# Patient Record
Sex: Female | Born: 1970 | Race: White | Hispanic: No | Marital: Married | Smoking: Never smoker
Health system: Southern US, Community
[De-identification: ages and names within clinical notes are randomized; demographics above are authoritative.]

## PROBLEM LIST (undated history)

## (undated) DIAGNOSIS — R102 Pelvic and perineal pain: Secondary | ICD-10-CM

## (undated) DIAGNOSIS — I428 Other cardiomyopathies: Secondary | ICD-10-CM

## (undated) DIAGNOSIS — G43009 Migraine without aura, not intractable, without status migrainosus: Secondary | ICD-10-CM

## (undated) DIAGNOSIS — G5603 Carpal tunnel syndrome, bilateral upper limbs: Secondary | ICD-10-CM

## (undated) DIAGNOSIS — I10 Essential (primary) hypertension: Secondary | ICD-10-CM

## (undated) DIAGNOSIS — M1810 Unilateral primary osteoarthritis of first carpometacarpal joint, unspecified hand: Secondary | ICD-10-CM

## (undated) DIAGNOSIS — B001 Herpesviral vesicular dermatitis: Secondary | ICD-10-CM

## (undated) DIAGNOSIS — I509 Heart failure, unspecified: Secondary | ICD-10-CM

## (undated) DIAGNOSIS — I341 Nonrheumatic mitral (valve) prolapse: Secondary | ICD-10-CM

## (undated) DIAGNOSIS — T8859XA Other complications of anesthesia, initial encounter: Secondary | ICD-10-CM

## (undated) DIAGNOSIS — J309 Allergic rhinitis, unspecified: Secondary | ICD-10-CM

## (undated) DIAGNOSIS — E042 Nontoxic multinodular goiter: Secondary | ICD-10-CM

## (undated) DIAGNOSIS — F429 Obsessive-compulsive disorder, unspecified: Secondary | ICD-10-CM

## (undated) HISTORY — DX: Allergic rhinitis, unspecified: J30.9

## (undated) HISTORY — DX: Nonrheumatic mitral (valve) prolapse: I34.1

## (undated) HISTORY — DX: Migraine without aura, not intractable, without status migrainosus: G43.009

## (undated) HISTORY — DX: Obsessive-compulsive disorder, unspecified: F42.9

## (undated) HISTORY — DX: Unilateral primary osteoarthritis of first carpometacarpal joint, unspecified hand: M18.10

## (undated) HISTORY — PX: WRIST ARTHROSCOPY WITH CARPOMETACARPEL (CMC) ARTHROPLASTY: SHX5680

## (undated) HISTORY — PX: ABDOMINAL HYSTERECTOMY: SHX81

## (undated) HISTORY — DX: Herpesviral vesicular dermatitis: B00.1

## (undated) HISTORY — PX: WISDOM TOOTH EXTRACTION: SHX21

---

## 1998-11-04 ENCOUNTER — Other Ambulatory Visit: Admission: RE | Admit: 1998-11-04 | Discharge: 1998-11-04 | Payer: Self-pay | Admitting: Obstetrics and Gynecology

## 1999-01-05 ENCOUNTER — Other Ambulatory Visit: Admission: RE | Admit: 1999-01-05 | Discharge: 1999-01-05 | Payer: Self-pay | Admitting: Obstetrics and Gynecology

## 1999-01-05 ENCOUNTER — Encounter (INDEPENDENT_AMBULATORY_CARE_PROVIDER_SITE_OTHER): Payer: Self-pay | Admitting: Specialist

## 1999-01-19 ENCOUNTER — Encounter: Payer: Self-pay | Admitting: Obstetrics and Gynecology

## 1999-01-19 ENCOUNTER — Ambulatory Visit (HOSPITAL_COMMUNITY): Admission: RE | Admit: 1999-01-19 | Discharge: 1999-01-19 | Payer: Self-pay | Admitting: Obstetrics and Gynecology

## 1999-01-29 ENCOUNTER — Encounter (INDEPENDENT_AMBULATORY_CARE_PROVIDER_SITE_OTHER): Payer: Self-pay | Admitting: Specialist

## 1999-01-29 ENCOUNTER — Other Ambulatory Visit: Admission: RE | Admit: 1999-01-29 | Discharge: 1999-01-29 | Payer: Self-pay | Admitting: Obstetrics and Gynecology

## 1999-02-25 ENCOUNTER — Encounter: Payer: Self-pay | Admitting: Obstetrics and Gynecology

## 1999-02-25 ENCOUNTER — Ambulatory Visit (HOSPITAL_COMMUNITY): Admission: RE | Admit: 1999-02-25 | Discharge: 1999-02-25 | Payer: Self-pay | Admitting: Obstetrics and Gynecology

## 1999-05-01 ENCOUNTER — Ambulatory Visit (HOSPITAL_COMMUNITY): Admission: RE | Admit: 1999-05-01 | Discharge: 1999-05-01 | Payer: Self-pay | Admitting: *Deleted

## 1999-05-01 ENCOUNTER — Encounter: Payer: Self-pay | Admitting: *Deleted

## 1999-05-19 ENCOUNTER — Inpatient Hospital Stay (HOSPITAL_COMMUNITY): Admission: AD | Admit: 1999-05-19 | Discharge: 1999-05-19 | Payer: Self-pay | Admitting: *Deleted

## 1999-05-29 ENCOUNTER — Inpatient Hospital Stay (HOSPITAL_COMMUNITY): Admission: AD | Admit: 1999-05-29 | Discharge: 1999-06-01 | Payer: Self-pay | Admitting: Obstetrics and Gynecology

## 1999-12-02 ENCOUNTER — Other Ambulatory Visit: Admission: RE | Admit: 1999-12-02 | Discharge: 1999-12-02 | Payer: Self-pay | Admitting: Obstetrics and Gynecology

## 2000-12-02 ENCOUNTER — Encounter: Admission: RE | Admit: 2000-12-02 | Discharge: 2000-12-02 | Payer: Self-pay | Admitting: Family Medicine

## 2000-12-02 ENCOUNTER — Encounter: Payer: Self-pay | Admitting: Family Medicine

## 2001-05-05 ENCOUNTER — Other Ambulatory Visit: Admission: RE | Admit: 2001-05-05 | Discharge: 2001-05-05 | Payer: Self-pay | Admitting: Obstetrics and Gynecology

## 2001-05-10 ENCOUNTER — Ambulatory Visit (HOSPITAL_COMMUNITY): Admission: RE | Admit: 2001-05-10 | Discharge: 2001-05-10 | Payer: Self-pay | Admitting: Obstetrics and Gynecology

## 2001-05-10 ENCOUNTER — Encounter (INDEPENDENT_AMBULATORY_CARE_PROVIDER_SITE_OTHER): Payer: Self-pay

## 2002-04-18 ENCOUNTER — Other Ambulatory Visit: Admission: RE | Admit: 2002-04-18 | Discharge: 2002-04-18 | Payer: Self-pay | Admitting: Obstetrics and Gynecology

## 2002-10-17 ENCOUNTER — Inpatient Hospital Stay (HOSPITAL_COMMUNITY): Admission: AD | Admit: 2002-10-17 | Discharge: 2002-10-20 | Payer: Self-pay | Admitting: Obstetrics and Gynecology

## 2002-10-21 ENCOUNTER — Encounter: Admission: RE | Admit: 2002-10-21 | Discharge: 2002-11-20 | Payer: Self-pay | Admitting: Obstetrics and Gynecology

## 2004-07-07 ENCOUNTER — Other Ambulatory Visit: Admission: RE | Admit: 2004-07-07 | Discharge: 2004-07-07 | Payer: Self-pay | Admitting: Obstetrics and Gynecology

## 2010-05-15 ENCOUNTER — Encounter: Admission: RE | Admit: 2010-05-15 | Discharge: 2010-05-15 | Payer: Self-pay | Admitting: Obstetrics and Gynecology

## 2010-07-03 ENCOUNTER — Ambulatory Visit (HOSPITAL_COMMUNITY)
Admission: RE | Admit: 2010-07-03 | Discharge: 2010-07-03 | Payer: Self-pay | Source: Home / Self Care | Attending: Obstetrics and Gynecology | Admitting: Obstetrics and Gynecology

## 2010-07-06 LAB — CBC
HCT: 39.8 % (ref 36.0–46.0)
Hemoglobin: 13.2 g/dL (ref 12.0–15.0)
MCH: 27.8 pg (ref 26.0–34.0)
MCHC: 33.2 g/dL (ref 30.0–36.0)
MCV: 83.8 fL (ref 78.0–100.0)
Platelets: 408 10*3/uL — ABNORMAL HIGH (ref 150–400)
RBC: 4.75 MIL/uL (ref 3.87–5.11)
RDW: 13 % (ref 11.5–15.5)
WBC: 9.9 10*3/uL (ref 4.0–10.5)

## 2010-07-06 LAB — PREGNANCY, URINE: Preg Test, Ur: NEGATIVE

## 2010-07-09 NOTE — H&P (Addendum)
Leslie Esparza, Leslie Esparza NO.:  [id number]  MEDICAL RECORD NO.:  [id number]          PATIENT TYPE:  LOCATION:                                 FACILITY:  PHYSICIAN:  Hal Morales, M.D.     DATE OF BIRTH:  DATE OF ADMISSION: DATE OF DISCHARGE:                             HISTORY & PHYSICAL   DATE OF OPERATION:  July 03, 2010.  HISTORY OF THE PRESENT ILLNESS:  The patient is a 40 year old white married female para 2-0-1-2 who presents for evaluation of menstrual rrhagia with prolonged menses.  Up until approximately a year ago, the patient had menses every 28-32 days lasting for approximately 5 days. In the last year she has noticed that she has approximately 5 days of normal tapering bleeding but then had brown staining for another 7-10 days.  So she requires protection for approximately 12-14 days per month.  She has no other intermenstrual bleeding.  She has had no other GYN abnormality.  The last Pap smear was in November 2011 and was normal.  She has never had an abnormal Pap smear.  She uses condoms for contraception and is pleased with this method.  Her last menstrual period began on June 22, 2010.  PAST HISTORY:  Obstetrical, the patient had a primary low transverse cesarean section for breech in 2000.  She had a spontaneous abortion in 2002 and had a repeat cesarean section in 2004.  GYN history is essentially negative.  Medical history is positive for depression for which she is treated with Prozac.  The patient also has a history of mitral valve prolapse and has used antibiotic prophylaxis for dental cleaning.  FAMILY HISTORY:  Positive for heart disease, asthma, thyroid disease, cancer, diabetes and mental health problems.  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  She is of the [employer].  CURRENT MEDICATIONS:  Prozac 80 mg a day.  DRUG SENSITIVITIES:  PENICILLIN causes a rash.  She has not taken  it since she was a child.  She can take amoxicillin without difficulty.  REVIEW OF SYSTEMS:  As noted above.  PHYSICAL EXAMINATION:  GENERAL:  The patient is a well-developed white female in no acute distress. VITAL SIGNS:  Blood pressure is 110/76, weight is 149-1/2 pound, height is 5 feet 2 inches. LUNGS:  Clear. HEART:  Regular rate and rhythm. ABDOMEN:  Soft without masses or organomegaly. PELVIC:  EGBUS within normal limits.  The vagina is rugous.  There are small amount of brown discharge in the vagina.  The cervix is nontender without lesions.  The uterus is upper limits of normal size and nontender.  Adnexa, no masses.  Rectovaginal, no masses.  Sonohysterogram performed shows uterus that measures 10 x 5 x 4 cm.  The endometrial thickness on day 3 is 6 mm.  The right and left ovaries are normal.  On sonohysterogram, saline infusion shows a hyperechoic mass in the posterior lower uterine segment measuring 1.1 x 0.6 cm.  The endometrial width is 2.27 cm and the length is 6.73 cm.  IMPRESSION: 1. Metrorrhagia with prolonged menstrual spotting. 2. Sonohysterogram consistent  with endometrial polyp.  DISPOSITION:  A recommendation is given to the patient that she undergo hysteroscopic removal of the endometrial polyp and this will be performed at [facility].  The risks of anesthesia, bleeding, infection and damage to adjacent organs have all been reviewed and the patient wishes to proceed.  She has been given reading information from the [facility] pamphlet on hysteroscopy.     Hal Morales, M.D.     VPH/MEDQ  D:  06/24/2010  T:  06/25/2010  Job:  [id number]  Electronically Signed by Dierdre Forth M.D. on 07/09/2010 03:30:29 PM

## 2010-07-09 NOTE — Op Note (Addendum)
  Leslie Esparza, Leslie Esparza            ACCOUNT NO.:  [id number]  MEDICAL RECORD NO.:  [id number]          PATIENT TYPE:  AMB  LOCATION:  SDC                           [facility]ILITY:  [facility]  PHYSICIAN:  Hal Morales, M.D.DATE OF BIRTH:  November 29, 1970  DATE OF PROCEDURE:  07/03/2010 DATE OF DISCHARGE:  07/03/2010                              OPERATIVE REPORT   PREOPERATIVE DIAGNOSES:  Metrorrhagia and endometrial polyp.  POSTOPERATIVE DIAGNOSES:  Metrorrhagia and endometrial polyp.  OPERATIONS:  Hysteroscopy and polyp resection.  SURGEON:  Hal Morales, MD  ANESTHESIA:  General LMA.  ESTIMATED BLOOD LOSS:  Less than 10 mL.  COMPLICATIONS:  None.  FINDINGS:  The endometrial cavity for the most part was fairly atrophic, but did contain a 1-cm polyp emanating from the left anterior uterus near the lower uterine segment.  PROCEDURE:  The patient was taken to the operating room after appropriate identification and placed on the operating table.  After the attainment of adequate general anesthesia, she was placed in lithotomy position.  The perineum and vagina were prepped with multiple layers of Betadine and a red Robinson catheter used to empty the bladder.  The perineum was draped as a sterile field.  A Graves speculum was placed in the vagina and a paracervical block achieved with a total of 10 mL of 2% Xylocaine in the 5 and 7 o'clock positions.  A single-tooth tenaculum was placed on the cervix.  The uterus was sounded to 7-cm.  The cervix was dilated to accommodate the diagnostic hysteroscope and this was used to evaluate the endometrial cavity with the above-noted findings.  These findings were documented and the cervix was further dilated to accommodate the operative hysteroscope.  The aforementioned polyp was resected and removed from the operative field.  The endometrial cavity and endocervix were then curetted and sent as separate specimens.  All instruments were then  removed from the vagina.  A single suture was placed on the cervix to allow hemostasis at the site of the tenaculum placement.  Hemostasis was adequate and the patient was awakened from general anesthesia and taken to the recovery room in satisfactory condition having tolerated the procedure well with sponge and instrument counts correct.  SPECIMENS TO PATHOLOGY:  Endometrial polyp, endometrial curettings.  DISCHARGE INSTRUCTIONS:  Printed instructions for hysteroscopy from the [facility].  DISCHARGE MEDICATIONS: 1. Ibuprofen 600 mg p.o. q.6 h. for 3 days then p.r.n. pain. 2. Vicodin 5/500 one p.o. q.4 h. p.r.n. severe pain.  FOLLOWUP INSTRUCTIONS:  The patient is to followup in 2 weeks with Dr. Pennie Rushing.     Hal Morales, M.D.     VPH/MEDQ  D:  07/03/2010  T:  07/04/2010  Job:  [id number]  Electronically Signed by Dierdre Forth M.D. on 07/09/2010 03:30:36 PM

## 2010-10-30 NOTE — Discharge Summary (Signed)
NAME:  Leslie Esparza, Leslie Esparza                      ACCOUNT NO.:  [id number]   MEDICAL RECORD NO.:  [id number]                   PATIENT TYPE:  INP   LOCATION:  9134                                 [facility]ILITY:  [facility]   PHYSICIAN:  Naima A. Dillard, M.D.              DATE OF BIRTH:  07/27/1970   DATE OF ADMISSION:  10/17/2002  DATE OF DISCHARGE:  10/20/2002                                 DISCHARGE SUMMARY   ADMISSION DIAGNOSES:  1. Intrauterine pregnancy at 37 and two-sevenths weeks.  2. Spontaneous rupture of membranes.  3. Early labor.  4. Planned repeat cesarean section due to previous cesarean section.  5. PENICILLIN allergy.  6. Mitral valve prolapse without regurgitation.   DISCHARGE DIAGNOSES:  1. Intrauterine pregnancy at 37 and two-sevenths weeks.  2. Spontaneous rupture of membranes.  3. Early labor.  4. Planned repeat cesarean section due to previous cesarean section.  5. PENICILLIN allergy.  6. Mitral valve prolapse without regurgitation.   HOSPITAL PROCEDURES:  1. Spinal anesthesia.  2. Repeat low transverse cesarean section of a viable female infant weighing     6 pounds 15 ounces, Apgars 9 and 9, with no complications.   HOSPITAL COURSE:  The patient was admitted with leakage of clear fluid and  spontaneous rupture of membranes in early labor.  The patient had planned on  a repeat cesarean section the following week due to a previous cesarean  section for a breech presentation, and therefore a repeat cesarean section  was undertaken.  The infant was delivered and the patient was taken to  recovery with no complications.  On postoperative day #1 the patient was  doing well, voiding without difficulty, and tolerating clear liquids.  Vital  signs were stable and she was afebrile.  Hemoglobin was 10.4 and her  physical exam was within normal limits.  On postoperative day #2 the patient  reported several loose stools but did not have any fever, nausea, or  vomiting.  She  was placed on a BRAT diet and the episode resolved.  Orthostatic blood pressures were within normal limits and she began to feel  better.  White blood cell count was 10.5 and she continued to be observed.  On postoperative day #3 she was ready to go home.  She had experienced no  more diarrhea stools.  Vital signs were stable; she was afebrile.  Breasts  were soft and nontender.  Heart rate regular rate and rhythm.  Chest clear.  Abdomen soft and appropriately tender.  Incision clean, dry, and intact.  Extremity within normal limits.  Lochia small.  She was deemed to have  received the full benefit of her hospital stay and was discharged home.   DISCHARGE LABORATORY DATA:  White blood cell count 10.5, hemoglobin 11.5,  hematocrit 34.0, platelets 294.  RPR nonreactive.   DISCHARGE MEDICATIONS:  1. Motrin 600 mg p.o. q.6h. p.r.n.  2. Tylox one to two p.o.  q.4h. p.r.n.   DISCHARGE INSTRUCTIONS:  Per [facility] handout.   DISCHARGE FOLLOW-UP:  In six weeks or p.r.n.     Marie L. Williams, C.N.M.                 Naima A. Normand Sloop, M.D.    MLW/MEDQ  D:  10/20/2002  T:  10/20/2002  Job:  [id number]

## 2010-10-30 NOTE — Op Note (Signed)
NAME:  Leslie Esparza, Leslie Esparza                      ACCOUNT NO.:  [id number]   MEDICAL RECORD NO.:  [id number]                   PATIENT TYPE:  INP   LOCATION:  9134                                 [facility]ILITY[facility]   PHYSICIAN:  Naima A. Dillard, M.D.              DATE OF BIRTH:  1970/12/02   DATE OF PROCEDURE:  DATE OF DISCHARGE:                                 OPERATIVE REPORT   PREOPERATIVE DIAGNOSIS:  Rupture of membranes at 36 weeks, desires repeat  cesarean section.   POSTOPERATIVE DIAGNOSIS:  Rupture of membranes at 36 weeks, desires repeat  cesarean section.   PROCEDURE:  Repeat cesarean section.   ANESTHESIA:  Spinal.   SURGEON:  Naima A. Normand Sloop, M.D.   ASSISTANT:  Rica Koyanagi, C.N.M.   INTRAVENOUS FLUIDS:  2600 mL Crystalloid   URINE OUTPUT:  275 mL clear urine   ESTIMATED BLOOD LOSS:  600 mL   FINDINGS:  Female infant in vertex presentation with Apgars of 9 and 9  weighing 6 pounds 15 ounces.  Normal-appearing uterus, tubes, and ovaries  bilaterally.   COMPLICATIONS:  None.   PROCEDURE IN DETAIL:  The patient was taken to the operating room where she  was given spinal anesthesia, placed in dorsal supine position with a left  lateral tilt, prepped and draped in the normal sterile fashion.  Before she  was prepped and draped, she was checked and her cervix was found to be 1 to  2, 70, -3.  Once her spinal anesthesia was felt to be adequate, a  Pfannenstiel skin incision was made through her prior incision and carried  down to the fascia using Bovie cautery.  The fascia was then incised in the  midline and excised bilaterally using ______ with teeth and Mayo scissors.  Kocher's x2 were placed on the superior aspect of the fascia which was  dissected off the rectus muscle both sharply with the Bovie cautery and  bluntly.  The inferior aspect of her fascia was dissected off the rectus  muscle in a similar fascia.  The rectus muscle was separated in the  midline.  The peritoneum was identified ________ Metzenbaum scissors bilaterally.  Bladder blade was reinserted.  A low transverse uterine incision was then  made with a scalpel and extended bluntly.  The head was delivered through  the incision.  There was some difficulty from the head coming out of  incision so a vacuum was placed on the infant's head in the normal fashion.  There was one popoff, two pulls, head delivered through the incision, all  pulls were done in the green field at 500 mmHg, just one popoff and two  pulls.  There was one body cord then the body delivered without difficulty  with was clear fluid noted.  Cord gas was obtained.  Placenta was manually  extracted and uterus cleared of all clot and debris.  Uterine incision was  repaired with  0 Vicryl in a running lock fashion and second layer, 0 Vicryl  was used for imbrication.  The abdomen was irrigated with copious saline.  Both tubes and ovaries were found to be  normal.  The peritoneum was reapproximated using 0 chromic.  The fascia was  closed using 0 Vicryl in a running fashion.  Once the incision was irrigated  the skin was closed with staples.  Sponge, lap and needle counts were  correct x2.  The patient returned to recovery room in stable condition.                                               Naima A. Normand Sloop, M.D.    NAD/MEDQ  D:  10/17/2002  T:  10/17/2002  Job:  [id number]

## 2010-10-30 NOTE — H&P (Signed)
NAME:  Leslie Esparza, Leslie Esparza                      ACCOUNT NO.:  [id number]   MEDICAL RECORD NO.:  [id number]                   PATIENT TYPE:  MAT   LOCATION:  MATC                                 [facility]   PHYSICIAN:  Naima A. Dillard, M.D.              DATE OF BIRTH:  1971/03/21   DATE OF ADMISSION:  10/17/2002  DATE OF DISCHARGE:                                HISTORY & PHYSICAL   HISTORY OF PRESENT ILLNESS:  The patient is a 40 year old gravida 3 para 1-0-  1-1 at 44 and two-sevenths weeks who presents today with spontaneous rupture  of membranes at approximately 8 a.m. and irregular contractions since then.  She is scheduled for a repeat cesarean section on Oct 25, 2002 and she does  wish to proceed today with cesarean section.  The patient's pregnancy is  remarkable for:  1. Previous low transverse cesarean section in 2000 for a breech     presentation.  2. History of panic disease with obsessive-compulsive disorder, in good     control.  3. Mitral valve prolapse without regurgitation.  4. History of dysplastic nevi.  5. History of irregular cycles.  6. Spotting in first trimester.  7. PENICILLIN allergy.   PRENATAL LABORATORY DATA:  Blood type O positive, Rh antibody negative.  VDRL nonreactive.  Rubella titer positive.  Hepatitis B surface antigen  negative.  GC and chlamydia cultures negative.  Pap normal.  Glucose  challenge normal.  AFP normal.  Cystic fibrosis testing negative.  HIV  declined.  Hemoglobin upon entering the practice was 12.6; it was 10.8 to  11.5 at 27 weeks.  EDC of Nov 09, 2002 was established by last menstrual  period and was in agreement with ultrasound at approximately 18 weeks.  Group B strep culture was negative at 36 weeks.   HISTORY OF PRESENT ILLNESS:  The patient entered care at approximately 10  weeks.  She had no history of mitral valve regurgitation upon review of her  records.  She had an upper respiratory infection at  approximately 15 weeks  that was treated with Tussionex and a Z pack.  She had an ultrasound at 19  weeks that showed normal growth and development.  She declined a third  trimester RPR.  She made a plan for repeat C section.  The rest of her  pregnancy was essentially uncomplicated.  She did have an onset of  approximately two days of diarrhea at 37 weeks which did resolve  spontaneously.  She also had some issues with hemorrhoids.   OBSTETRICAL HISTORY:  1. In 2000 she had a primary low transverse cesarean section of a breech     presentation for a female infant that weighed 8 pounds at 39 and four-     sevenths weeks.  2. In December 2002 she had an intrauterine fetal demise at nine weeks with     a D&C.   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  She is a previous Triphasil user.   PREVIOUS SURGERIES:  Primary low transverse cesarean section in December  2000.   ALLERGIES:  PENICILLIN.   MEDICAL HISTORY:  She does have a history of mitral valve for which she  usually takes prophylaxis for dental work but no other procedures.  She was  diagnosed with obsessive-compulsive and panic disorder by Dr. Smith Mince.  She previously has been on Prozac but has not been on anything during her  pregnancy.  She had an excision of a dysplastic nevi in the past.   FAMILY HISTORY:  Paternal grandfather had an MI.  Father has hypertension.  Cousin had a positive PPD.  Her paternal grandfather had insulin-dependent  diabetes.  Father and paternal aunt have thyroid problems.  Maternal great-  grandmother and maternal grandfather had cancer.   GENETIC HISTORY:  Unremarkable.   SOCIAL HISTORY:  The patient is married to the father of the baby.  He is  involved and supportive.  His name is Lilya Smitherman.  The patient is  Caucasian, of the [address] faith.  She has been followed by the certified  nurse midwife service with  consultation with the M.D.'s for planning for  surgery.  She denies any alcohol, drug, or tobacco use during this  pregnancy.  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  Her husband  has two years of technical school.  He is employed in the [employer].   PHYSICAL EXAMINATION:  VITAL SIGNS:  Stable; the patient is afebrile.  HEENT:  Within normal limits.  LUNGS:  Bilateral breath sounds are clear.  HEART:  Regular rate and rhythm without murmur.  BREASTS:  Soft and nontender.  ABDOMEN:  Fundal height is approximately 37 cm.  Estimated fetal weight is 6  to 6.5 pounds.  Uterine contractions are very irregular, mild to moderate  quality.  PELVIC:  The patient is leaking copious clear fluid.  The cervix is a  fingertip, 75%, vertex at a -1 to a 0 station.  Fetal heart rate is in the  150s by Doppler with no audible decelerations.  EXTREMITIES:  Deep tendon reflexes are 2+ without clonus.  There is a trace  edema noted.   IMPRESSION:  1. Intrauterine pregnancy at 37 and two-sevenths weeks.  2. Spontaneous rupture of membranes with early labor.  3. Planned repeat cesarean section secondary to previous cesarean section.  4. PENICILLIN allergy.  5. Mitral valve prolapse without regurgitation, with no requirement for     antibiotics.   PLAN:  1. Admit to the [facility] per consult with Dr. Jaymes Graff as attending physician.  2. Routine physician preoperative orders.     Renaldo Reel Emilee Hero, C.N.M.                   Naima A. Normand Sloop, M.D.    Leeanne Mannan  D:  10/17/2002  T:  10/17/2002  Job:  [id number]

## 2010-10-30 NOTE — Op Note (Signed)
[facility]  Patient:    Leslie Esparza, Leslie Esparza Visit Number: [id number] MRN: [id number]          Service Type: DSU Location: [facility] Attending Physician:  Shaune Spittle Dictated by:   Maris Berger. Pennie Rushing, M.D. Proc. Date: 05/10/01 Admit Date:  05/10/2001                             Operative Report  PREOPERATIVE DIAGNOSIS:       Intrauterine fetal demise at nine weeks.  POSTOPERATIVE DIAGNOSIS:      Intrauterine fetal demise at nine weeks.  OPERATION:                    Suction dilatation and evacuation.  SURGEON:                      Vanessa P. Pennie Rushing, M.D.  ANESTHESIA:                   Monitored anesthesia care and local.  ESTIMATED BLOOD LOSS:         Less than 100 cc.  COMPLICATIONS:                None.  FINDINGS:                     There were a moderate amount of products of conception and the uterine cavity.  DESCRIPTION OF PROCEDURE:     The patient was taken to the operating room and placed on the operating table.  After placement of equipment for monitored anesthesia care, she was placed in the lithotomy position.  The perineum and the vagina were prepped with multiple layers of Betadine and draped as a sterile field.  A Graves speculum was placed in the vagina and a single tooth tenaculum placed on the anterior cervix.  A paracervical block was achieved with a total of 10 cc of 2% Xylocaine in the 5 and 7 oclock positions.  The cervix was then dilated to accommodate to a #9 suction curet.  The suction catheter was then used to evacuate all products of conception of the uterus. The small curet was used to ensure that all products of conception had been removed.  All instruments were then removed from the vagina and a silver nitrate application used to achieve hemostasis at the site of the tenaculum stick.  Hemostasis was then noted to be adequate and all instruments were removed from the vagina.  The products of  conception were sent for chromosomal analysis per the patients request.  The patient was taken from the operating room to the recovery room in satisfactory condition having tolerated the procedure well with sponge and instrument counts correct.  DISCHARGE INSTRUCTIONS:       Printed instructions from [facility] for [facility].  DISCHARGE MEDICATIONS:        Ibuprofen 600 mg p.o. q.6h. p.r.n. pain, doxycycline 100 mg p.o. b.i.d. for five days, Methergine 0.2 mg p.o. q.6h. for eight doses.  Blood type is O-positive.  FOLLOWUP:                     Two weeks. Dictated by:   Maris Berger. Pennie Rushing, M.D. Attending Physician:  Shaune Spittle DD:  05/10/01 TD:  05/10/01 Job: [id number]

## 2010-10-30 NOTE — H&P (Signed)
[facility] of [facility]  Patient:    Leslie Esparza                    MRN: [id number] Adm. Date:  [id number] Attending:  Shaune Spittle Dictator:   Mack Guise, C.N.M.                         History and Physical  HISTORY OF PRESENT ILLNESS:   Leslie Esparza is a 40 year old gravida 1, para 0 t term, scheduled for primary C-section this a.m. for breech presentation.  She woke up at approximately 2 a.m. with questionable rupture of membranes, positive fetal movement, no bleeding from vagina, mild cramping and contractions, irregular. Denies any headache, visual changes or epigastric pain.  Her pregnancy has been  followed by the C.N.M. and then by the M.D. service at [facility] and is remarkable for: #1 - Panic disorder; #2 - mitral valve prolapse with prophylaxis; #3 - choroid plexus cyst, followed throughout pregnancy by ultrasound; #4 - mole; #5 - scheduled primary cesarean section for breech presentation.  PRENATAL LABORATORY DATA:     On Nov 04, 1998, hemoglobin and hematocrit 13.1 and 32.4, platelets 460,000; blood type O-positive; Rh-antibody negative; VDRL nonreactive; Rubella immune; hepatitis B surface antigen negative; HIV nonreactive; Pap smear within normal limits.  On December 03, 1998, AFP/free beta hCG testing within normal range.  On March 11, 1999, one-hour glucose challenge high at 162; three-hour GTT normal.  MEDICAL HISTORY:              Panic disorder/obsessive-compulsive disorder since childhood.  She has mitral valve prolapse and she takes prophylactic antibiotics.  SURGICAL HISTORY:             Age 37, wisdom teeth.  FAMILY HISTORY:               Paternal grandfather -- MI.  Father with a history of chronic hypertension.  Patients cousin has a positive PPD.  Paternal grandfather -- insulin-dependent diabetes.  Father and paternal aunt -- thyroid  disease.  Maternal grandmother and grandfather with cancer, unknown  type. Paternal grandfather with skin cancer.  Mother with panic disorder, obsessive-compulsive  disorder and depression; a cousin with panic disorder; paternal aunt with depression; paternal grandmother with depression.  GENETIC HISTORY:              There is no family history of familial or genetic  disorders, children that died in infancy or that were born with birth defects.  MEDICATIONS:                  Prenatal vitamins.  ALLERGIES:                    PENICILLIN.  HABITS:                       Patient denies the use of tobacco, alcohol or illicit drugs.  SOCIAL HISTORY:               Leslie Esparza is a 40 year old married female. Her husband is Shireen Quan.  He works as a Pensions consultant.  He is involved and supportive.  They are of the [facility] faith.  HISTORY OF PRESENT PREGNANCY:  Patient was initially evaluated at the office of  [facility] on Oct 14, 1998 at approximately 7-weeks gestation.  Her pregnancy has been  remarkable for a mole that was removed with this  pregnancy and choroid plexus cyst, followed with serial ultrasounds.  One-hour glucose testing was high but followup three-hour glucose tolerance test was normal.  On ultrasound at end of pregnancy, patient has been found to have a breech presentation and has elected a primary cesarean delivery.  REVIEW OF SYSTEMS:            There are no signs or symptoms suggestive of focal or systemic disease and the patient is typical of one with a uterine pregnancy at term.  PHYSICAL EXAMINATION:  VITAL SIGNS:                  Stable.  Afebrile.  HEENT:                        Unremarkable.  LUNGS:                        Clear.  HEART:                        Regular rate and rhythm.  ABDOMEN:                      Gravid is its contour and uterine fundus is noted to extend 39 cm above the level of the pubic symphysis.  The baseline of the fetal  heart rate monitor is 130 to 140 with average long-term  variability. Reactivity is present with no periodic changes.  Contractions are mild and irregular.  PELVIC:                       Sterile speculum exam finds no pooling, negative Nitrazine, negative fern.  Cervix is 1-cm dilated and 80% effaced.  ASSESSMENT:                   1. Intrauterine pregnancy at term.                               2. Scheduled primary C-section for breech                                  presentation.  PLAN:                         Admit for scheduled C-section per Janine Limbo, M.D.  Routine M.D. orders. DD:  05/29/99 TD:  05/29/99 Job: [id number]

## 2010-10-30 NOTE — Op Note (Signed)
[facility] of [facility]  Patient:    Leslie Esparza                    MRN: [id number] Proc. Date: 05/29/99 Adm. Date:  [id number] Attending:  Dierdre Forth Pearline                           Operative Report  PREOPERATIVE DIAGNOSIS: 1. Term intrauterine pregnancy. 2. Breech position.  POSTOPERATIVE DIAGNOSIS: 1. Term intrauterine pregnancy. 2. Breech position.  OPERATION:  Primary low transverse cesarean section.  SURGEON:  Janine Limbo, M.D.  FIRST ASSISTANT:  Andrey Campanile, CNM  ANESTHESIA:  Spinal.  DISPOSITION:  [name] is a 40 year old who presents at [redacted] weeks gestation for a cesarean delivery.  An ultrasound was performed in the preoperative area which confirmed a breech infant.  The patient understands the options for management nd she also understands and accepts the risks associated with a cesarean delivery including, but not limited to, anesthetic complications, bleeding, infections and possible damage to the surrounding organs.  FINDINGS:  An 8-pound, female infant Trixie Deis) was delivered from a complete  breech presentation.  The Apgars were 8 at one minute and 9 at five minutes. The fallopian tubes, ovaries and the uterus were normal for the gravid state.  DESCRIPTION OF PROCEDURE:  The patient was taken to the operating room where a spinal anesthetic was given.  The patients abdomen and perineum were prepped with multiple layers of Betadine.  A Foley catheter was placed in the bladder.  The patient was sterilely draped.  A low transverse incision was made in the abdomen and carried sharply through the subcutaneous tissues, the fascia and the anterior peritoneum.  An incision was made in the lower uterine segment and extended transversely.  The infant was delivered by a complete breeth extraction.  Two nucal cords were noted.  The cord was clamped and cut and the infant was handed to the awaiting pediatric team.   Routine cord blood studies were obtained.  The placenta was removed.  The uterine cavity was cleaned of amniotic fluid, clotted blood, nd membranes.  The cervix was dilated.  The uterine incision was closed using a running locking suture of 2-0 Vicryl followed by figure-of-eight sutures of 2-0  Vicryl for hemostasis.  Hemostasis was adequate.  The abdominal cavity was irrigated.  Hemostasis again was noted to be adequate.  The anterior peritoneum and the abdominal musculature were reapproximated in the midline using a single interrupted suture of 2-0 Vicryl.  The fascia was irrigated.  The fascia was closed using a running suture of 0 Vicryl followed by three interrupted sutures of 0 Vicryl.  The subcutaneous tissue was closed using interrupted sutures of 2-0 Vicryl.  The skin was reapproximated using skin staples.  Sponge, needle and instrument counts were correct on two occasions.  The estimated blood loss was 500 cc.  The patient tolerated the procedure well.  She was taken to the recovery room in stable condition.  The infant was taken to the full term nursery in stable condition. DD:  05/29/99 TD:  06/01/99 Job: [id number]

## 2012-04-18 ENCOUNTER — Other Ambulatory Visit: Payer: Self-pay | Admitting: Obstetrics and Gynecology

## 2012-04-18 DIAGNOSIS — Z1231 Encounter for screening mammogram for malignant neoplasm of breast: Secondary | ICD-10-CM

## 2012-05-26 ENCOUNTER — Ambulatory Visit
Admission: RE | Admit: 2012-05-26 | Discharge: 2012-05-26 | Disposition: A | Discharge status: Home / Self Care | Payer: 59 | Source: Ambulatory Visit | Attending: Obstetrics and Gynecology | Admitting: Obstetrics and Gynecology

## 2012-05-26 DIAGNOSIS — Z1231 Encounter for screening mammogram for malignant neoplasm of breast: Secondary | ICD-10-CM

## 2013-07-13 ENCOUNTER — Other Ambulatory Visit: Payer: Self-pay | Admitting: Physician Assistant

## 2013-07-13 ENCOUNTER — Other Ambulatory Visit: Payer: Self-pay

## 2013-07-13 ENCOUNTER — Other Ambulatory Visit: Payer: Self-pay | Admitting: Family Medicine

## 2013-07-13 DIAGNOSIS — Z1231 Encounter for screening mammogram for malignant neoplasm of breast: Secondary | ICD-10-CM

## 2013-07-27 ENCOUNTER — Ambulatory Visit
Admission: RE | Admit: 2013-07-27 | Discharge: 2013-07-27 | Disposition: A | Discharge status: Home / Self Care | Payer: 59 | Source: Ambulatory Visit | Attending: Physician Assistant | Admitting: Physician Assistant

## 2013-07-27 DIAGNOSIS — Z1231 Encounter for screening mammogram for malignant neoplasm of breast: Secondary | ICD-10-CM

## 2013-08-01 ENCOUNTER — Other Ambulatory Visit: Payer: Self-pay | Admitting: Physician Assistant

## 2013-08-01 DIAGNOSIS — R928 Other abnormal and inconclusive findings on diagnostic imaging of breast: Secondary | ICD-10-CM

## 2013-08-14 ENCOUNTER — Ambulatory Visit
Admission: RE | Admit: 2013-08-14 | Discharge: 2013-08-14 | Disposition: A | Discharge status: Home / Self Care | Payer: 59 | Source: Ambulatory Visit | Attending: Physician Assistant | Admitting: Physician Assistant

## 2013-08-14 DIAGNOSIS — R928 Other abnormal and inconclusive findings on diagnostic imaging of breast: Secondary | ICD-10-CM

## 2014-03-13 ENCOUNTER — Ambulatory Visit: Payer: Self-pay | Admitting: Physician Assistant

## 2014-04-22 ENCOUNTER — Encounter: Payer: Self-pay | Admitting: Internal Medicine

## 2014-04-24 ENCOUNTER — Encounter (INDEPENDENT_AMBULATORY_CARE_PROVIDER_SITE_OTHER): Payer: Self-pay

## 2014-04-24 ENCOUNTER — Encounter: Payer: Self-pay | Admitting: Internal Medicine

## 2014-04-24 ENCOUNTER — Ambulatory Visit (INDEPENDENT_AMBULATORY_CARE_PROVIDER_SITE_OTHER): Payer: 59 | Admitting: Internal Medicine

## 2014-04-24 VITALS — BP 142/90 | HR 87 | Ht 61.0 in | Wt 163.8 lb

## 2014-04-24 DIAGNOSIS — R05 Cough: Secondary | ICD-10-CM | POA: Insufficient documentation

## 2014-04-24 DIAGNOSIS — J189 Pneumonia, unspecified organism: Secondary | ICD-10-CM

## 2014-04-24 DIAGNOSIS — R059 Cough, unspecified: Secondary | ICD-10-CM | POA: Insufficient documentation

## 2014-04-24 MED ORDER — FAMOTIDINE 20 MG PO TABS: ORAL_TABLET | ORAL | Status: DC

## 2014-04-24 MED ORDER — PANTOPRAZOLE SODIUM 40 MG PO TBEC: 40.0000 mg | DELAYED_RELEASE_TABLET | Freq: Every day | ORAL | Status: DC

## 2014-04-24 NOTE — Progress Notes (Signed)
   Subjective:    Patient ID: Leslie Esparza, female    DOB: 1971/03/07,  MRN: [id number]  HPI  14 yowf never smoker some seasonal rhinitis x her early 54s acutely ill early sept 2015 chest tightness/ fever  > saw Cyndi Bender  p sick x one week: rx levquin starting 02/25/14  Referred to pulmonary clinic 04/24/14 for persistent cough and changes on CT.   04/24/2014 1st [facility] office visit/ Leslie Esparza   Chief Complaint  Patient presents with  . Pulmonary Consult    Referred by Dr. Cyndi Bender. Pt c/o cough for the past 2 months. Cough is occ prod with minimal dark yellow sputum.    main ;problem is lingering cough that actually started p took abx and present 24 /7  Fever gone, vol of mucus is down but sensation of daytime excess pnds Last abx 11/7 , last pred one week prior to that  No cough on insp/ no better with saba   No obvious other patterns in day to day or daytime variabilty or assoc chronic cough or cp or chest tightness, subjective wheeze or overt  hb symptoms. No unusual exp hx or h/o childhood pna/ asthma or knowledge of premature birth.  Sleeping ok without nocturnal  or early am exacerbation  of respiratory  c/o's or need for noct saba. Also denies any obvious fluctuation of symptoms with weather or environmental changes or other aggravating or alleviating factors except as outlined above   Current Medications, Allergies, Complete Past Medical History, Past Surgical History, Family History, and Social History were reviewed in [employer] record.            Review of Systems  Constitutional: Negative for fever, chills and unexpected weight change.  HENT: Positive for postnasal drip. Negative for congestion, dental problem, ear pain, nosebleeds, rhinorrhea, sinus pressure, sneezing, sore throat, trouble swallowing and voice change.   Eyes: Negative for visual disturbance.  Respiratory: Positive for cough. Negative for choking and shortness  of breath.   Cardiovascular: Negative for chest pain and leg swelling.  Gastrointestinal: Negative for vomiting, abdominal pain and diarrhea.  Genitourinary: Negative for difficulty urinating.  Musculoskeletal: Negative for arthralgias.  Skin: Negative for rash.  Neurological: Negative for tremors, syncope and headaches.  Hematological: Does not bruise/bleed easily.       Objective:   Physical Exam  amb wf nad nasal tone to voice / nad   Wt Readings from Last 3 Encounters:  04/24/14 163 lb 12.8 oz (74.299 kg)  04/22/14 163 lb (73.936 kg)    Vital signs reviewed   HEENT: nl dentition, turbinates, and orophanx which is pristine. Nl external ear canals without cough reflex   NECK :  without JVD/Nodes/TM/ nl carotid upstrokes bilaterally   LUNGS: no acc muscle use, clear to A and P bilaterally without cough on insp or exp maneuvers   CV:  RRR  no s3 or murmur or increase in P2, no edema   ABD:  soft and nontender with nl excursion in the supine position. No bruits or organomegaly, bowel sounds nl  MS:  warm without deformities, calf tenderness, cyanosis or clubbing  SKIN: warm and dry without lesions    NEURO:  alert, approp, no deficits          Assessment & Plan:

## 2014-04-24 NOTE — Patient Instructions (Addendum)
Delsym 2 tsp every 12 hours as needed for cough (the goal is not coughing at all)  For drainage stop allergra  take chlortrimeton (chlorpheniramine) 4 mg every 4 hours available over the counter (may cause drowsiness)   Automatically take Pantoprazole (protonix) 40 mg   Take 30-60 min before first meal of the day and Pepcid 20 mg one bedtime until return to office - this is the best way to tell whether stomach acid is contributing to your problem.    GERD (REFLUX)  is an extremely common cause of respiratory symptoms just like yours , many times with no obvious heartburn at all.    It can be treated with medication, but also with lifestyle changes including avoidance of late meals, excessive alcohol, smoking cessation, and avoid fatty foods, chocolate, peppermint, colas, red wine, and acidic juices such as orange juice.  NO MINT OR MENTHOL PRODUCTS SO NO COUGH DROPS  USE SUGARLESS CANDY INSTEAD (Jolley ranchers or Stover's or Life Savers) or even ice chips will also do - the key is to swallow to prevent all throat clearing. NO OIL BASED VITAMINS - use powdered substitutes.   Please see patient coordinator before you leave today  to schedule sinus Ct  Please schedule a follow up office visit in 2  weeks, sooner if needed

## 2014-04-25 DIAGNOSIS — J189 Pneumonia, unspecified organism: Secondary | ICD-10-CM | POA: Insufficient documentation

## 2014-04-25 NOTE — Assessment & Plan Note (Signed)
Her exam is nl and she does not cough on inspiration so I believe what we're seeing is a localized form of organizing pna with prev abx adequate and not enough dz to warrant chronic steroids.  Therefore would not bx or even do additional cts due to cost / RT exp if we can clear her cough completely but treating her uacs (see sep a/p)

## 2014-04-25 NOTE — Assessment & Plan Note (Signed)

## 2014-05-02 ENCOUNTER — Ambulatory Visit (INDEPENDENT_AMBULATORY_CARE_PROVIDER_SITE_OTHER)
Admission: RE | Admit: 2014-05-02 | Discharge: 2014-05-02 | Disposition: A | Discharge status: Home / Self Care | Payer: 59 | Source: Ambulatory Visit | Attending: Internal Medicine | Admitting: Internal Medicine

## 2014-05-02 DIAGNOSIS — R059 Cough, unspecified: Secondary | ICD-10-CM

## 2014-05-02 DIAGNOSIS — R05 Cough: Secondary | ICD-10-CM

## 2014-05-03 NOTE — Progress Notes (Signed)
Quick Note:  Spoke with pt and notified of results per Dr. Wert. Pt verbalized understanding and denied any questions.  ______ 

## 2014-05-07 ENCOUNTER — Encounter: Payer: Self-pay | Admitting: Internal Medicine

## 2014-05-07 ENCOUNTER — Other Ambulatory Visit (INDEPENDENT_AMBULATORY_CARE_PROVIDER_SITE_OTHER): Payer: 59

## 2014-05-07 ENCOUNTER — Ambulatory Visit (INDEPENDENT_AMBULATORY_CARE_PROVIDER_SITE_OTHER)
Admission: RE | Admit: 2014-05-07 | Discharge: 2014-05-07 | Disposition: A | Discharge status: Home / Self Care | Payer: 59 | Source: Ambulatory Visit | Attending: Internal Medicine | Admitting: Internal Medicine

## 2014-05-07 ENCOUNTER — Ambulatory Visit (INDEPENDENT_AMBULATORY_CARE_PROVIDER_SITE_OTHER): Payer: 59 | Admitting: Internal Medicine

## 2014-05-07 VITALS — BP 112/78 | HR 83 | Ht 61.0 in | Wt 167.4 lb

## 2014-05-07 DIAGNOSIS — R05 Cough: Secondary | ICD-10-CM

## 2014-05-07 DIAGNOSIS — R059 Cough, unspecified: Secondary | ICD-10-CM

## 2014-05-07 DIAGNOSIS — J189 Pneumonia, unspecified organism: Secondary | ICD-10-CM

## 2014-05-07 LAB — CBC WITH DIFFERENTIAL/PLATELET
Basophils Absolute: 0 10*3/uL (ref 0.0–0.1)
Basophils Relative: 0.4 % (ref 0.0–3.0)
EOS PCT: 1.4 % (ref 0.0–5.0)
Eosinophils Absolute: 0.1 10*3/uL (ref 0.0–0.7)
HEMATOCRIT: 41 % (ref 36.0–46.0)
HEMOGLOBIN: 13.6 g/dL (ref 12.0–15.0)
LYMPHS ABS: 1.6 10*3/uL (ref 0.7–4.0)
LYMPHS PCT: 15.4 % (ref 12.0–46.0)
MCHC: 33.2 g/dL (ref 30.0–36.0)
MCV: 83.2 fl (ref 78.0–100.0)
Monocytes Absolute: 1 10*3/uL (ref 0.1–1.0)
Monocytes Relative: 9.2 % (ref 3.0–12.0)
Neutro Abs: 7.8 10*3/uL — ABNORMAL HIGH (ref 1.4–7.7)
Neutrophils Relative %: 73.6 % (ref 43.0–77.0)
Platelets: 502 10*3/uL — ABNORMAL HIGH (ref 150.0–400.0)
RBC: 4.93 Mil/uL (ref 3.87–5.11)
RDW: 14.9 % (ref 11.5–15.5)
WBC: 10.7 10*3/uL — ABNORMAL HIGH (ref 4.0–10.5)

## 2014-05-07 LAB — SEDIMENTATION RATE: SED RATE: 18 mm/h (ref 0–22)

## 2014-05-07 MED ORDER — TRAMADOL HCL 50 MG PO TABS: ORAL_TABLET | ORAL | Status: DC

## 2014-05-07 MED ORDER — CEFDINIR 300 MG PO CAPS: 300.0000 mg | ORAL_CAPSULE | Freq: Two times a day (BID) | ORAL | Status: DC

## 2014-05-07 MED ORDER — PREDNISONE 10 MG PO TABS: ORAL_TABLET | ORAL | Status: DC

## 2014-05-07 NOTE — Progress Notes (Signed)
Subjective:    Patient ID: Leslie Esparza, female    DOB: 1971/03/30,  MRN: [id number]    Brief patient profile:  59 yowf never smoker tendency to throat clearly year round since in her 20's   acutely ill early sept 2015 chest tightness/ fever  > saw Cyndi Bender  p sick x one week: rx levquin starting 02/25/14  Referred to pulmonary clinic 04/24/14 for persistent cough and changes on CT.    04/24/2014 1st [facility] office visit/ Leslie Esparza   Chief Complaint  Patient presents with  . Pulmonary Consult    Referred by Dr. Cyndi Bender. Pt c/o cough for the past 2 months. Cough is occ prod with minimal dark yellow sputum.    Main problem is lingering cough that actually started p took abx and present 24 /7 but had since her 89s and evolving mostly day  Fever gone, vol of mucus is down but sensation of daytime excess pnds Last abx 11/7 , last pred one week prior to that  No cough on insp/ no better with saba  rec Delsym 2 tsp every 12 hours as needed for cough (the goal is not coughing at all) For drainage stop allergra  take chlortrimeton (chlorpheniramine) 4 mg every 4 hours available over the counter (may cause drowsiness)  Automatically take Pantoprazole (protonix) 40 mg   Take 30-60 min before first meal of the day and Pepcid 20 mg one bedtime until return to office - this is the best way to tell whether stomach acid is contributing to your problem.   GERD  Diet    schedule sinus Ct> neg   05/07/2014 f/u ov/Leslie Esparza re: flare up of daytime throat clearing p pna Chief Complaint  Patient presents with  . Follow-up    Pt states her cough is unchanged. She has had increased hoarseness the past 2 days. Has not needed to use rescue inhaler.   cough is a little more productive than usual/ yellow mucus/ lump in throat, no sob no noct or peaking in am    No obvious day to day or daytime variabilty or assoc sob cp or chest tightness, subjective wheeze overt sinus or hb symptoms. No  unusual exp hx or h/o childhood pna/ asthma or knowledge of premature birth.  Sleeping ok without nocturnal  or early am exacerbation  of respiratory  c/o's or need for noct saba. Also denies any obvious fluctuation of symptoms with weather or environmental changes or other aggravating or alleviating factors except as outlined above   Current Medications, Allergies, Complete Past Medical History, Past Surgical History, Family History, and Social History were reviewed in [employer] record.  ROS  The following are not active complaints unless bolded sore throat, dysphagia, dental problems, itching, sneezing,  nasal congestion or excess/ purulent secretions, ear ache,   fever, chills, sweats, unintended wt loss, pleuritic or exertional cp, hemoptysis,  orthopnea pnd or leg swelling, presyncope, palpitations, heartburn, abdominal pain, anorexia, nausea, vomiting, diarrhea  or change in bowel or urinary habits, change in stools or urine, dysuria,hematuria,  rash, arthralgias, visual complaints, headache, numbness weakness or ataxia or problems with walking or coordination,  change in mood/affect or memory.                        Objective:   Physical Exam  amb wf somber/ nad    05/07/2014      167  Wt Readings from Last 3 Encounters:  04/24/14 163 lb 12.8 oz (74.299 kg)  04/22/14 163 lb (73.936 kg)    Vital signs reviewed   HEENT: nl dentition, turbinates, and orophanx which is pristine. Nl external ear canals without cough reflex   NECK :  without JVD/Nodes/TM/ nl carotid upstrokes bilaterally   LUNGS: no acc muscle use, clear to A and P bilaterally without cough on insp or exp maneuvers   CV:  RRR  no s3 or murmur or increase in P2, no edema   ABD:  soft and nontender with nl excursion in the supine position. No bruits or organomegaly, bowel sounds nl  MS:  warm without deformities, calf tenderness, cyanosis or clubbing  SKIN: warm and dry without  lesions    NEURO:  alert, approp, no deficits   CXR  05/07/2014 : No active cardiopulmonary disease.       Lab Results  Component Value Date   ESRSEDRATE 18 05/07/2014     Lab Results  Component Value Date   WBC 10.7* 05/07/2014   HGB 13.6 05/07/2014   HCT 41.0 05/07/2014   MCV 83.2 05/07/2014   PLT 502.0* 05/07/2014       Assessment & Plan:

## 2014-05-07 NOTE — Patient Instructions (Addendum)
Take delsym two tsp every 12 hours and supplement if needed with  tramadol 50 mg up to 2 every 4 hours to suppress the urge to cough. Swallowing water or using ice chips/non mint and menthol containing candies (such as lifesavers or sugarless jolly ranchers) are also effective.  You should rest your voice and avoid activities that you know make you cough.  Once you have eliminated the cough for 3 straight days try reducing the tramadol first,  then the delsym as tolerated.    omnicef 300 mg twice daily x  10 days  Prednisone 10 mg take  4 each am x 2 days,   2 each am x 2 days,  1 each am x 2 days and stop   Please remember to go to the lab and x-ray department downstairs for your tests - we will call you with the results when they are available.  Please schedule a follow up office visit in 2 weeks, sooner if needed

## 2014-05-08 LAB — ALLERGY FULL PROFILE
ALTERNARIA ALTERNATA: 0.17 kU/L — AB
ASPERGILLUS FUMIGATUS M3: 2.07 kU/L — AB
Allergen, D pternoyssinus,d7: <0.1 kU/L
Allergen,Goose feathers, e70: <0.1 kU/L
Bermuda Grass: <0.1 kU/L
Box Elder IgE: <0.1 kU/L
COMMON RAGWEED: 0.53 kU/L — AB
CURVULARIA LUNATA: 0.14 kU/L — AB
Candida Albicans: <0.1 kU/L
Cat Dander: 2.15 kU/L — ABNORMAL HIGH
DOG DANDER: 0.27 kU/L — AB
G005 Rye, Perennial: <0.1 kU/L
G009 Red Top: <0.1 kU/L
Goldenrod: 0.15 kU/L — ABNORMAL HIGH
HOUSE DUST HOLLISTER: 0.36 kU/L — AB
Helminthosporium halodes: 0.22 kU/L — ABNORMAL HIGH
IgE (Immunoglobulin E), Serum: 111 kU/L (ref <115)
Oak: <0.1 kU/L
Plantain: <0.1 kU/L
Stemphylium Botryosum: 0.27 kU/L — ABNORMAL HIGH
Timothy Grass: <0.1 kU/L

## 2014-05-08 NOTE — Assessment & Plan Note (Signed)
Onset symptoms ? early Sept 2015  - CT chst 04/10/14 mild interstitial residual changes LLL s effusion   Turns out it's impossible to determine when acute changes occurred as she has chronic cough x 20 years "just always need to clear throat year round".  No evidence at all of ongoing pna so I would only repeat CT chest if symptoms persist that are different from chronic baseline

## 2014-05-08 NOTE — Assessment & Plan Note (Signed)
-   onset early 68s - sinus CT 05/02/2014 >>>Diffuse paranasal sinus disease with mucosal thickening but no air-fluid level.  Symptoms strongly suggest dx of  Classic Upper airway cough syndrome, so named because it's frequently impossible to sort out how much is  CR/sinusitis with freq throat clearing (which can be related to primary GERD)   vs  causing  secondary (" extra esophageal")  GERD from wide swings in gastric pressure that occur with throat clearing, often  promoting self use of mint and menthol lozenges that reduce the lower esophageal sphincter tone and exacerbate the problem further in a cyclical fashion.   These are the same pts (now being labeled as having "irritable larynx syndrome" by some cough centers) who not infrequently have a history of having failed to tolerate ace inhibitors,  dry powder inhalers or biphosphonates or report having atypical reflux symptoms that don't respond to standard doses of PPI , and are easily confused as having aecopd or asthma flares by even experienced allergists/ pulmonologists.   For now rec max rx for gerd/ cyclical cough then regroup

## 2014-05-10 NOTE — Progress Notes (Signed)
Quick Note:  LMTCB ______ 

## 2014-05-13 NOTE — Progress Notes (Signed)
Quick Note:  Spoke with pt and notified of results per Dr. Wert. Pt verbalized understanding and denied any questions.  ______ 

## 2014-05-21 ENCOUNTER — Ambulatory Visit: Payer: 59 | Admitting: Internal Medicine

## 2014-05-23 ENCOUNTER — Encounter: Payer: Self-pay | Admitting: Internal Medicine

## 2015-09-05 ENCOUNTER — Other Ambulatory Visit: Payer: Self-pay

## 2016-05-17 ENCOUNTER — Encounter (HOSPITAL_COMMUNITY): Payer: Self-pay

## 2016-05-17 ENCOUNTER — Emergency Department (HOSPITAL_COMMUNITY): Payer: 59

## 2016-05-17 ENCOUNTER — Emergency Department (HOSPITAL_COMMUNITY)
Admission: EM | Admit: 2016-05-17 | Discharge: 2016-05-17 | Disposition: A | Discharge status: Home / Self Care | Payer: 59 | Attending: Emergency Medicine | Admitting: Emergency Medicine

## 2016-05-17 DIAGNOSIS — R079 Chest pain, unspecified: Secondary | ICD-10-CM | POA: Diagnosis present

## 2016-05-17 DIAGNOSIS — R0789 Other chest pain: Secondary | ICD-10-CM | POA: Insufficient documentation

## 2016-05-17 DIAGNOSIS — Z79899 Other long term (current) drug therapy: Secondary | ICD-10-CM | POA: Insufficient documentation

## 2016-05-17 LAB — COMPREHENSIVE METABOLIC PANEL
ALK PHOS: 73 U/L (ref 38–126)
ALT: 20 U/L (ref 14–54)
ANION GAP: 8 (ref 5–15)
AST: 20 U/L (ref 15–41)
Albumin: 4.2 g/dL (ref 3.5–5.0)
BILIRUBIN TOTAL: 0.3 mg/dL (ref 0.3–1.2)
BUN: 9 mg/dL (ref 6–20)
CALCIUM: 9.5 mg/dL (ref 8.9–10.3)
CO2: 27 mmol/L (ref 22–32)
Chloride: 102 mmol/L (ref 101–111)
Creatinine, Ser: 0.57 mg/dL (ref 0.44–1.00)
GFR calc Af Amer: >60 mL/min (ref >60)
Glucose, Bld: 90 mg/dL (ref 65–99)
POTASSIUM: 3.3 mmol/L — AB (ref 3.5–5.1)
Sodium: 137 mmol/L (ref 135–145)
TOTAL PROTEIN: 7.3 g/dL (ref 6.5–8.1)

## 2016-05-17 LAB — I-STAT TROPONIN, ED
Troponin i, poc: 0 ng/mL (ref 0.00–0.08)
Troponin i, poc: 0 ng/mL (ref 0.00–0.08)

## 2016-05-17 LAB — CBC
HCT: 41.3 % (ref 36.0–46.0)
Hemoglobin: 13.8 g/dL (ref 12.0–15.0)
MCH: 28.3 pg (ref 26.0–34.0)
MCHC: 33.4 g/dL (ref 30.0–36.0)
MCV: 84.8 fL (ref 78.0–100.0)
Platelets: 458 10*3/uL — ABNORMAL HIGH (ref 150–400)
RBC: 4.87 MIL/uL (ref 3.87–5.11)
RDW: 13.5 % (ref 11.5–15.5)
WBC: 9.4 10*3/uL (ref 4.0–10.5)

## 2016-05-17 LAB — LIPASE, BLOOD: LIPASE: 20 U/L (ref 11–51)

## 2016-05-17 LAB — D-DIMER, QUANTITATIVE: D-Dimer, Quant: 0.27 ug/mL-FEU (ref 0.00–0.50)

## 2016-05-17 NOTE — ED Notes (Signed)
Pt transported to xray 

## 2016-05-17 NOTE — ED Provider Notes (Signed)
[facility] DEPT Provider Note   CSN: [id number] Arrival date & time: 05/17/16  1110     History   Chief Complaint Chief Complaint  Patient presents with  . Chest Pain    HPI Leslie Esparza is a 45 y.o. female.  HPI Patient presents with 3-4 days of intermittent chest pain. She describes it as heaviness and then sharp "twinges". No shortness of breath or cough. She has chronic abdominal pain and has had greater than one year of loose stool. She went to see her gastrologist today who referred her to the emergency department for chest pain. She denies any immobilization or extended travel. No new lower sugary swelling or pain. She states the pain in her chest radiates into her back. Past Medical History:  Diagnosis Date  . Allergic rhinitis   . Common migraine   . OCD (obsessive compulsive disorder)   . Osteoarthritis of thumb   . Recurrent cold sores     Patient Active Problem List   Diagnosis Date Noted  . CAP (community acquired pneumonia) 04/25/2014  . Cough 04/24/2014    History reviewed. No pertinent surgical history.  OB History    No data available       Home Medications    Prior to Admission medications   Medication Sig Start Date End Date Taking? Authorizing Provider  albuterol (PROVENTIL HFA;VENTOLIN HFA) 108 (90 BASE) MCG/ACT inhaler Inhale 2 puffs into the lungs every 4 (four) hours as needed for wheezing or shortness of breath.   Yes Historical Provider, MD  cholecalciferol (VITAMIN D) 1000 units tablet Take 1,000 Units by mouth daily.   Yes Historical Provider, MD  FLUoxetine (PROZAC) 40 MG capsule Take 80 mg by mouth daily.   Yes Historical Provider, MD  SUMAtriptan (IMITREX) 100 MG tablet Take 100 mg by mouth every 2 (two) hours as needed for migraine or headache. May repeat in 2 hours if headache persists or recurs.   Yes Historical Provider, MD  cefdinir (OMNICEF) 300 MG capsule Take 1 capsule (300 mg total) by mouth 2 (two) times  daily. Patient not taking: Reported on 05/17/2016 05/07/14   Tanda Rockers, MD  famotidine (PEPCID) 20 MG tablet One at bedtime Patient not taking: Reported on 05/17/2016 04/24/14   Tanda Rockers, MD  pantoprazole (PROTONIX) 40 MG tablet Take 1 tablet (40 mg total) by mouth daily. Take 30-60 min before first meal of the day Patient not taking: Reported on 05/17/2016 04/24/14   Tanda Rockers, MD  predniSONE (DELTASONE) 10 MG tablet Take  4 each am x 2 days,   2 each am x 2 days,  1 each am x 2 days and stop Patient not taking: Reported on 05/17/2016 05/07/14   Tanda Rockers, MD  traMADol Veatrice Bourbon) 50 MG tablet 1-2 every 4 hours as needed for cough or pain Patient not taking: Reported on 05/17/2016 05/07/14   Tanda Rockers, MD    Family History Family History  Problem Relation Age of Onset  . Heart disease Paternal Grandfather   . Thyroid disease Father     Social History Social History  Substance Use Topics  . Smoking status: Never Smoker  . Smokeless tobacco: Never Used  . Alcohol use No     Allergies   Penicillins   Review of Systems Review of Systems  Constitutional: Negative for chills and fever.  HENT: Negative for sore throat, trouble swallowing and voice change.   Respiratory: Negative for cough and shortness of  breath.   Cardiovascular: Positive for chest pain. Negative for palpitations and leg swelling.  Gastrointestinal: Positive for abdominal pain and diarrhea. Negative for constipation, nausea and vomiting.  Genitourinary: Negative for difficulty urinating, dysuria, flank pain and hematuria.  Musculoskeletal: Positive for back pain. Negative for arthralgias, joint swelling, myalgias, neck pain and neck stiffness.  Skin: Negative for rash and wound.  Neurological: Negative for dizziness, weakness, light-headedness, numbness and headaches.  Psychiatric/Behavioral: The patient is nervous/anxious.   All other systems reviewed and are negative.    Physical  Exam Updated Vital Signs BP 145/85   Pulse 85   Temp 98.3 F (36.8 C) (Oral)   Resp 13   Ht 5\' 1"  (1.549 m)   Wt 170 lb (77.1 kg)   LMP 05/09/2016   SpO2 98%   BMI 32.12 kg/m   Physical Exam  Constitutional: She is oriented to person, place, and time. She appears well-developed and well-nourished.  Patient is mildly anxious appearing  HENT:  Head: Normocephalic and atraumatic.  Mouth/Throat: Oropharynx is clear and moist. No oropharyngeal exudate.  Eyes: EOM are normal. Pupils are equal, round, and reactive to light.  Neck: Normal range of motion. Neck supple.  Cardiovascular: Normal rate and regular rhythm.  Exam reveals no gallop and no friction rub.   No murmur heard. Pulmonary/Chest: Effort normal and breath sounds normal. No stridor. No respiratory distress. She has no wheezes. She has no rales. She exhibits no tenderness.  Abdominal: Soft. Bowel sounds are normal. There is no tenderness. There is no rebound and no guarding.  Musculoskeletal: Normal range of motion. She exhibits no edema or tenderness.  No lower extremity swelling, asymmetry or tenderness. No thoracic or lumbar tenderness to palpation. No bilateral CVA tenderness.  Lymphadenopathy:    She has no cervical adenopathy.  Neurological: She is alert and oriented to person, place, and time.  Patient is alert and oriented x3 with clear, goal oriented speech. Patient has 5/5 motor in all extremities. Sensation is intact to light touch.   Skin: Skin is warm and dry. No rash noted. No erythema.  Nursing note and vitals reviewed.    ED Treatments / Results  Labs (all labs ordered are listed, but only abnormal results are displayed) Labs Reviewed  CBC - Abnormal; Notable for the following:       Result Value   Platelets 458 (*)    All other components within normal limits  COMPREHENSIVE METABOLIC PANEL - Abnormal; Notable for the following:    Potassium 3.3 (*)    All other components within normal limits   LIPASE, BLOOD  D-DIMER, QUANTITATIVE (NOT AT [facility])  Randolm Idol, ED  I-STAT TROPOININ, ED    EKG  EKG Interpretation  Date/Time:  Monday May 17 2016 11:15:41 EST Ventricular Rate:  94 PR Interval:  168 QRS Duration: 94 QT Interval:  370 QTC Calculation: 462 R Axis:   94 Text Interpretation:  Normal sinus rhythm Possible Left atrial enlargement Septal infarct , age undetermined Lateral infarct , age undetermined Abnormal ECG Confirmed by Lita Mains  MD, Madalin Hughart ([id number]) on 05/17/2016 11:45:04 AM       Radiology Dg Chest 2 View  Result Date: 05/17/2016 CLINICAL DATA:  Chest pain. EXAM: CHEST  2 VIEW COMPARISON:  No recent . FINDINGS: Mediastinum and hilar structures normal the lungs are clear. No pleural effusion or pneumothorax. Heart size normal. No acute bony abnormality identified . IMPRESSION: No acute cardiopulmonary disease. Electronically Signed   By: Marcello Moores  Register  On: 05/17/2016 11:59    Procedures Procedures (including critical care time)  Medications Ordered in ED Medications - No data to display   Initial Impression / Assessment and Plan / ED Course  I have reviewed the triage vital signs and the nursing notes.  Pertinent labs & imaging results that were available during my care of the patient were reviewed by me and considered in my medical decision making (see chart for details).  Clinical Course    EKG and troponin 2 without evidence of ischemia. Normal d-dimer. The patient's blood pressure and heart rate have improved while being in the emergency department. Suspect some of this may be stress/anxiety related. Advised follow-up with cardiology as an outpatient. Return precautions have been given.   Final Clinical Impressions(s) / ED Diagnoses   Final diagnoses:  Atypical chest pain    New Prescriptions Discharge Medication List as of 05/17/2016  3:20 PM       Julianne Rice, MD 05/17/16 [phone]

## 2016-05-17 NOTE — ED Triage Notes (Signed)
intermittent tightness with sharp twinges, lt. Side chest pain. Began 3-4 days ago.    Pt. Was at [facility] today and they sent pt. To [place].  Pt. Was there for chronic abdominal pain and loose stool.  Skin is warm,,. Pink and dry. ECG completed in Triage.

## 2016-05-17 NOTE — ED Notes (Signed)
Pt ambulated to restroom. Pt tolerated well.  

## 2016-05-17 NOTE — ED Notes (Signed)
Pt is in stable condition upon d/c and ambulates from ED. 

## 2016-06-20 DIAGNOSIS — M542 Cervicalgia: Secondary | ICD-10-CM | POA: Diagnosis not present

## 2016-06-20 DIAGNOSIS — J988 Other specified respiratory disorders: Secondary | ICD-10-CM | POA: Diagnosis not present

## 2016-06-20 DIAGNOSIS — J329 Chronic sinusitis, unspecified: Secondary | ICD-10-CM | POA: Diagnosis not present

## 2016-08-12 DIAGNOSIS — Z01411 Encounter for gynecological examination (general) (routine) with abnormal findings: Secondary | ICD-10-CM | POA: Diagnosis not present

## 2016-08-12 DIAGNOSIS — Z1231 Encounter for screening mammogram for malignant neoplasm of breast: Secondary | ICD-10-CM | POA: Diagnosis not present

## 2016-11-16 DIAGNOSIS — Z23 Encounter for immunization: Secondary | ICD-10-CM | POA: Diagnosis not present

## 2016-11-16 DIAGNOSIS — G43009 Migraine without aura, not intractable, without status migrainosus: Secondary | ICD-10-CM | POA: Diagnosis not present

## 2016-11-16 DIAGNOSIS — E041 Nontoxic single thyroid nodule: Secondary | ICD-10-CM | POA: Diagnosis not present

## 2016-11-16 DIAGNOSIS — E559 Vitamin D deficiency, unspecified: Secondary | ICD-10-CM | POA: Diagnosis not present

## 2016-11-16 DIAGNOSIS — E781 Pure hyperglyceridemia: Secondary | ICD-10-CM | POA: Diagnosis not present

## 2017-04-04 DIAGNOSIS — R030 Elevated blood-pressure reading, without diagnosis of hypertension: Secondary | ICD-10-CM | POA: Diagnosis not present

## 2017-04-04 DIAGNOSIS — R000 Tachycardia, unspecified: Secondary | ICD-10-CM | POA: Diagnosis not present

## 2017-04-04 DIAGNOSIS — E041 Nontoxic single thyroid nodule: Secondary | ICD-10-CM | POA: Diagnosis not present

## 2017-04-04 DIAGNOSIS — R1310 Dysphagia, unspecified: Secondary | ICD-10-CM | POA: Diagnosis not present

## 2017-04-25 DIAGNOSIS — E041 Nontoxic single thyroid nodule: Secondary | ICD-10-CM | POA: Diagnosis not present

## 2017-05-04 DIAGNOSIS — R1310 Dysphagia, unspecified: Secondary | ICD-10-CM | POA: Diagnosis not present

## 2017-05-04 DIAGNOSIS — K219 Gastro-esophageal reflux disease without esophagitis: Secondary | ICD-10-CM | POA: Diagnosis not present

## 2017-05-04 DIAGNOSIS — I10 Essential (primary) hypertension: Secondary | ICD-10-CM | POA: Diagnosis not present

## 2017-06-03 DIAGNOSIS — T17208A Unspecified foreign body in pharynx causing other injury, initial encounter: Secondary | ICD-10-CM | POA: Diagnosis not present

## 2017-06-03 DIAGNOSIS — K219 Gastro-esophageal reflux disease without esophagitis: Secondary | ICD-10-CM | POA: Diagnosis not present

## 2017-07-01 DIAGNOSIS — E042 Nontoxic multinodular goiter: Secondary | ICD-10-CM | POA: Diagnosis not present

## 2017-07-01 DIAGNOSIS — T17208A Unspecified foreign body in pharynx causing other injury, initial encounter: Secondary | ICD-10-CM | POA: Diagnosis not present

## 2017-07-01 DIAGNOSIS — E041 Nontoxic single thyroid nodule: Secondary | ICD-10-CM | POA: Diagnosis not present

## 2017-07-18 DIAGNOSIS — J342 Deviated nasal septum: Secondary | ICD-10-CM | POA: Diagnosis not present

## 2017-07-18 DIAGNOSIS — K219 Gastro-esophageal reflux disease without esophagitis: Secondary | ICD-10-CM | POA: Diagnosis not present

## 2017-07-18 DIAGNOSIS — R0989 Other specified symptoms and signs involving the circulatory and respiratory systems: Secondary | ICD-10-CM | POA: Diagnosis not present

## 2017-09-02 DIAGNOSIS — Z01411 Encounter for gynecological examination (general) (routine) with abnormal findings: Secondary | ICD-10-CM | POA: Diagnosis not present

## 2017-09-02 DIAGNOSIS — Z1231 Encounter for screening mammogram for malignant neoplasm of breast: Secondary | ICD-10-CM | POA: Diagnosis not present

## 2017-09-02 DIAGNOSIS — M545 Low back pain: Secondary | ICD-10-CM | POA: Diagnosis not present

## 2017-10-07 DIAGNOSIS — E781 Pure hyperglyceridemia: Secondary | ICD-10-CM | POA: Diagnosis not present

## 2017-10-07 DIAGNOSIS — G43009 Migraine without aura, not intractable, without status migrainosus: Secondary | ICD-10-CM | POA: Diagnosis not present

## 2017-10-07 DIAGNOSIS — E559 Vitamin D deficiency, unspecified: Secondary | ICD-10-CM | POA: Diagnosis not present

## 2017-10-07 DIAGNOSIS — E041 Nontoxic single thyroid nodule: Secondary | ICD-10-CM | POA: Diagnosis not present

## 2018-04-17 DIAGNOSIS — G43009 Migraine without aura, not intractable, without status migrainosus: Secondary | ICD-10-CM | POA: Diagnosis not present

## 2018-04-17 DIAGNOSIS — E781 Pure hyperglyceridemia: Secondary | ICD-10-CM | POA: Diagnosis not present

## 2018-04-17 DIAGNOSIS — E041 Nontoxic single thyroid nodule: Secondary | ICD-10-CM | POA: Diagnosis not present

## 2018-04-17 DIAGNOSIS — Z23 Encounter for immunization: Secondary | ICD-10-CM | POA: Diagnosis not present

## 2018-08-21 DIAGNOSIS — Z6830 Body mass index (BMI) 30.0-30.9, adult: Secondary | ICD-10-CM | POA: Diagnosis not present

## 2018-08-21 DIAGNOSIS — J019 Acute sinusitis, unspecified: Secondary | ICD-10-CM | POA: Diagnosis not present

## 2018-12-03 ENCOUNTER — Encounter: Payer: Self-pay | Admitting: Cardiology

## 2019-01-11 DIAGNOSIS — R102 Pelvic and perineal pain: Secondary | ICD-10-CM | POA: Insufficient documentation

## 2019-03-28 DIAGNOSIS — M533 Sacrococcygeal disorders, not elsewhere classified: Secondary | ICD-10-CM | POA: Insufficient documentation

## 2019-12-24 ENCOUNTER — Other Ambulatory Visit: Payer: Self-pay

## 2019-12-24 ENCOUNTER — Ambulatory Visit (INDEPENDENT_AMBULATORY_CARE_PROVIDER_SITE_OTHER): Payer: 59

## 2019-12-24 ENCOUNTER — Ambulatory Visit (INDEPENDENT_AMBULATORY_CARE_PROVIDER_SITE_OTHER): Payer: 59 | Admitting: Cardiology

## 2019-12-24 ENCOUNTER — Encounter: Payer: Self-pay | Admitting: Cardiology

## 2019-12-24 VITALS — BP 160/100 | HR 86 | Ht 61.5 in | Wt 168.5 lb

## 2019-12-24 DIAGNOSIS — R030 Elevated blood-pressure reading, without diagnosis of hypertension: Secondary | ICD-10-CM | POA: Diagnosis not present

## 2019-12-24 DIAGNOSIS — R002 Palpitations: Secondary | ICD-10-CM

## 2019-12-24 DIAGNOSIS — Z8679 Personal history of other diseases of the circulatory system: Secondary | ICD-10-CM

## 2019-12-24 NOTE — Patient Instructions (Signed)
Medication Instructions:   Your physician recommends that you continue on your current medications as directed. Please refer to the Current Medication list given to you today.  *If you need a refill on your cardiac medications before your next appointment, please call your pharmacy*   Lab Work: None Ordered If you have labs (blood work) drawn today and your tests are completely normal, you will receive your results only by: Marland Kitchen MyChart Message (if you have MyChart) OR . A paper copy in the mail If you have any lab test that is abnormal or we need to change your treatment, we will call you to review the results.   Testing/Procedures:  1.  Your physician has requested that you have an echocardiogram. Echocardiography is a painless test that uses sound waves to create images of your heart. It provides your doctor with information about the size and shape of your heart and how well your heart's chambers and valves are working. This procedure takes approximately one hour. There are no restrictions for this procedure.  Your physician has recommended that you wear a Zio monitor. This monitor is a medical device that records the heart's electrical activity. Doctors most often use these monitors to diagnose arrhythmias. Arrhythmias are problems with the speed or rhythm of the heartbeat. The monitor is a small device applied to your chest. You can wear one while you do your normal daily activities. While wearing this monitor if you have any symptoms to push the button and record what you felt. Once you have worn this monitor for the period of time provider prescribed (Usually 14 days), you will return the monitor device in the postage paid box. Once it is returned they will download the data collected and provide [place] with a report which the provider will then review and we will call you with those results. Important tips:  1. Avoid showering during the first 24 hours of wearing the monitor. 2. Avoid  excessive sweating to help maximize wear time. 3. Do not submerge the device, no hot tubs, and no swimming pools. 4. Keep any lotions or oils away from the patch. 5. After 24 hours you may shower with the patch on. Take brief showers with your back facing the shower head.  6. Do not remove patch once it has been placed because that will interrupt data and decrease adhesive wear time. 7. Push the button when you have any symptoms and write down what you were feeling. 8. Once you have completed wearing your monitor, remove and place into box which has postage paid and place in your outgoing mailbox.  9. If for some reason you have misplaced your box then call our office and we can provide another box and/or mail it off for you.        Follow-Up: At [facility], you and your health needs are our priority.  As part of our continuing mission to provide you with exceptional heart care, we have created designated Provider Care Teams.  These Care Teams include your primary Cardiologist (physician) and Advanced Practice Providers (APPs -  Physician Assistants and Nurse Practitioners) who all work together to provide you with the care you need, when you need it.  We recommend signing up for the patient portal called "MyChart".  Sign up information is provided on this After Visit Summary.  MyChart is used to connect with patients for Virtual Visits (Telemedicine).  Patients are able to view lab/test results, encounter notes, upcoming appointments, etc.  Non-urgent messages  can be sent to your provider as well.   To learn more about what you can do with MyChart, go to [email or web address].    Your next appointment:   Follow up after Echo and Zio results   The format for your next appointment:   In Person  Provider:   Kate Sable, MD   Other Instructions   Echocardiogram An echocardiogram is a procedure that uses painless sound waves (ultrasound) to produce an image of the heart.  Images from an echocardiogram can provide important information about:  Signs of coronary artery disease (CAD).  Aneurysm detection. An aneurysm is a weak or damaged part of an artery wall that bulges out from the normal force of blood pumping through the body.  Heart size and shape. Changes in the size or shape of the heart can be associated with certain conditions, including heart failure, aneurysm, and CAD.  Heart muscle function.  Heart valve function.  Signs of a past heart attack.  Fluid buildup around the heart.  Thickening of the heart muscle.  A tumor or infectious growth around the heart valves. Tell a health care provider about:  Any allergies you have.  All medicines you are taking, including vitamins, herbs, eye drops, creams, and over-the-counter medicines.  Any blood disorders you have.  Any surgeries you have had.  Any medical conditions you have.  Whether you are pregnant or may be pregnant. What are the risks? Generally, this is a safe procedure. However, problems may occur, including:  Allergic reaction to dye (contrast) that may be used during the procedure. What happens before the procedure? No specific preparation is needed. You may eat and drink normally. What happens during the procedure?   An IV tube may be inserted into one of your veins.  You may receive contrast through this tube. A contrast is an injection that improves the quality of the pictures from your heart.  A gel will be applied to your chest.  A wand-like tool (transducer) will be moved over your chest. The gel will help to transmit the sound waves from the transducer.  The sound waves will harmlessly bounce off of your heart to allow the heart images to be captured in real-time motion. The images will be recorded on a computer. The procedure may vary among health care providers and hospitals. What happens after the procedure?  You may return to your normal, everyday life,  including diet, activities, and medicines, unless your health care provider tells you not to do that. Summary  An echocardiogram is a procedure that uses painless sound waves (ultrasound) to produce an image of the heart.  Images from an echocardiogram can provide important information about the size and shape of your heart, heart muscle function, heart valve function, and fluid buildup around your heart.  You do not need to do anything to prepare before this procedure. You may eat and drink normally.  After the echocardiogram is completed, you may return to your normal, everyday life, unless your health care provider tells you not to do that. This information is not intended to replace advice given to you by your health care provider. Make sure you discuss any questions you have with your health care provider. Document Revised: 09/21/2018 Document Reviewed: 07/03/2016 Elsevier Patient Education  [place].

## 2019-12-24 NOTE — Progress Notes (Signed)
Cardiology Office Note:    Date:  12/24/2019   ID:  Leslie Esparza, DOB 05-10-1971, MRN [id number]  PCP:  Cyndi Bender, PA-C  [facility] Cardiologist:  Kate Sable, MD  [place] Electrophysiologist:  None   Referring MD: Philmore Pali, NP   Chief Complaint  Patient presents with  . OTHER    Palpitations. Meds reviewed verbally with pt.   Leslie Esparza is a 49 y.o. female who is being seen today for the evaluation of palpitations at the request of Lam, Rudi Rummage, NP.   History of Present Illness:    Leslie Esparza is a 49 y.o. female with a hx of migraine who presents due to palpitations.  Patient states having symptoms of palpitations for about a month now.  Symptoms were worse over the first 2 weeks, occurring almost daily lasting the entire day.  Symptoms are not related with exertion denies symptoms of chest pain or shortness of breath.  Denies dizziness.  Over the past 2 weeks, symptoms still occur with occasional skipped heartbeats, but not as frequent.  She denies any history of heart disease.  Denies drinking coffee or energy drinks.  Past Medical History:  Diagnosis Date  . Allergic rhinitis   . Common migraine   . Mitral valve prolapse   . OCD (obsessive compulsive disorder)   . Osteoarthritis of thumb   . Recurrent cold sores     History reviewed. No pertinent surgical history.  Current Medications: Current Meds  Medication Sig  . cholecalciferol (VITAMIN D) 1000 units tablet Take 1,000 Units by mouth daily.  . famotidine (PEPCID) 20 MG tablet One at bedtime (Patient taking differently: One at bedtime as needed.)  . FLUoxetine (PROZAC) 40 MG capsule Take 80 mg by mouth daily.  . pantoprazole (PROTONIX) 40 MG tablet Take 1 tablet (40 mg total) by mouth daily. Take 30-60 min before first meal of the day  . SUMAtriptan (IMITREX) 100 MG tablet Take 100 mg by mouth every 2 (two) hours as needed for migraine or headache. May repeat in 2 hours  if headache persists or recurs.     Allergies:   Penicillins   Social History   Socioeconomic History  . Marital status: Married    Spouse name: Not on file  . Number of children: Not on file  . Years of education: Not on file  . Highest education level: Not on file  Occupational History  . Not on file  Tobacco Use  . Smoking status: Never Smoker  . Smokeless tobacco: Never Used  Substance and Sexual Activity  . Alcohol use: No    Alcohol/week: 0.0 standard drinks  . Drug use: No  . Sexual activity: Not on file  Other Topics Concern  . Not on file  Social History Narrative  . Not on file   Social Determinants of Health   Financial Resource Strain:   . Difficulty of Paying Living Expenses:   Food Insecurity:   . Worried About Charity fundraiser in the Last Year:   . Arboriculturist in the Last Year:   Transportation Needs:   . Film/video editor (Medical):   Marland Kitchen Lack of Transportation (Non-Medical):   Physical Activity:   . Days of Exercise per Week:   . Minutes of Exercise per Session:   Stress:   . Feeling of Stress :   Social Connections:   . Frequency of Communication with Friends and Family:   . Frequency  of Social Gatherings with Friends and Family:   . Attends Religious Services:   . Active Member of Clubs or Organizations:   . Attends Archivist Meetings:   Marland Kitchen Marital Status:      Family History: The patient's family history includes Heart disease in her paternal grandfather; Thyroid disease in her father.  ROS:   Please see the history of present illness.     All other systems reviewed and are negative.  EKGs/Labs/Other Studies Reviewed:    The following studies were reviewed today:   EKG:  EKG is  ordered today.  The ekg ordered today demonstrates normal sinus rhythm, nonspecific interventricular conduction delay.  Recent Labs: No results found for requested labs within last 8760 hours.  Recent Lipid Panel No results found for:  CHOL, TRIG, HDL, CHOLHDL, VLDL, LDLCALC, LDLDIRECT  Physical Exam:    VS:  BP (!) 160/100 (BP Location: Right Arm, Patient Position: Sitting, Cuff Size: Normal)   Pulse 86   Ht 5' 1.5" (1.562 m)   Wt 168 lb 8 oz (76.4 kg)   SpO2 98%   BMI 31.32 kg/m     Wt Readings from Last 3 Encounters:  12/24/19 168 lb 8 oz (76.4 kg)  05/17/16 170 lb (77.1 kg)  05/07/14 167 lb 6.4 oz (75.9 kg)     GEN:  Well nourished, well developed in no acute distress HEENT: Normal NECK: No JVD; No carotid bruits LYMPHATICS: No lymphadenopathy CARDIAC: RRR, no murmurs, rubs, gallops RESPIRATORY:  Clear to auscultation without rales, wheezing or rhonchi  ABDOMEN: Soft, non-tender, non-distended MUSCULOSKELETAL:  No edema; No deformity  SKIN: Warm and dry NEUROLOGIC:  Alert and oriented x 3 PSYCHIATRIC:  Normal affect   ASSESSMENT:    1. Palpitations   2. Hx of mitral valve prolapse   3. Elevated BP without diagnosis of hypertension    PLAN:    In order of problems listed above:  1. Patient with a month history of palpitations.  Symptoms have improved over the past week or so but still present noticeably skipped heartbeats.  Cardiac monitor x2 weeks to evaluate for any significant arrhythmias. 2. Patient with history of mitral valve prolapse, get echocardiogram to evaluate valvular function. 3. Patient blood pressure today is elevated, she denies history of hypertension.  Monitor for now.  Will consider starting antihypertensive if elevated at follow-up visit.  Follow-up after echocardiogram and cardiac monitor.  This note was generated in part or whole with voice recognition software. Voice recognition is usually quite accurate but there are transcription errors that can and very often do occur. I apologize for any typographical errors that were not detected and corrected.  Medication Adjustments/Labs and Tests Ordered: Current medicines are reviewed at length with the patient today.  Concerns  regarding medicines are outlined above.  Orders Placed This Encounter  Procedures  . LONG TERM MONITOR (3-14 DAYS)  . EKG 12-Lead  . ECHOCARDIOGRAM COMPLETE   No orders of the defined types were placed in this encounter.   Patient Instructions  Medication Instructions:   Your physician recommends that you continue on your current medications as directed. Please refer to the Current Medication list given to you today.  *If you need a refill on your cardiac medications before your next appointment, please call your pharmacy*   Lab Work: None Ordered If you have labs (blood work) drawn today and your tests are completely normal, you will receive your results only by: Marland Kitchen MyChart Message (if you have  MyChart) OR . A paper copy in the mail If you have any lab test that is abnormal or we need to change your treatment, we will call you to review the results.   Testing/Procedures:  1.  Your physician has requested that you have an echocardiogram. Echocardiography is a painless test that uses sound waves to create images of your heart. It provides your doctor with information about the size and shape of your heart and how well your heart's chambers and valves are working. This procedure takes approximately one hour. There are no restrictions for this procedure.  Your physician has recommended that you wear a Zio monitor. This monitor is a medical device that records the heart's electrical activity. Doctors most often use these monitors to diagnose arrhythmias. Arrhythmias are problems with the speed or rhythm of the heartbeat. The monitor is a small device applied to your chest. You can wear one while you do your normal daily activities. While wearing this monitor if you have any symptoms to push the button and record what you felt. Once you have worn this monitor for the period of time provider prescribed (Usually 14 days), you will return the monitor device in the postage paid box. Once it is  returned they will download the data collected and provide [place] with a report which the provider will then review and we will call you with those results. Important tips:  1. Avoid showering during the first 24 hours of wearing the monitor. 2. Avoid excessive sweating to help maximize wear time. 3. Do not submerge the device, no hot tubs, and no swimming pools. 4. Keep any lotions or oils away from the patch. 5. After 24 hours you may shower with the patch on. Take brief showers with your back facing the shower head.  6. Do not remove patch once it has been placed because that will interrupt data and decrease adhesive wear time. 7. Push the button when you have any symptoms and write down what you were feeling. 8. Once you have completed wearing your monitor, remove and place into box which has postage paid and place in your outgoing mailbox.  9. If for some reason you have misplaced your box then call our office and we can provide another box and/or mail it off for you.        Follow-Up: At [facility], you and your health needs are our priority.  As part of our continuing mission to provide you with exceptional heart care, we have created designated Provider Care Teams.  These Care Teams include your primary Cardiologist (physician) and Advanced Practice Providers (APPs -  Physician Assistants and Nurse Practitioners) who all work together to provide you with the care you need, when you need it.  We recommend signing up for the patient portal called "MyChart".  Sign up information is provided on this After Visit Summary.  MyChart is used to connect with patients for Virtual Visits (Telemedicine).  Patients are able to view lab/test results, encounter notes, upcoming appointments, etc.  Non-urgent messages can be sent to your provider as well.   To learn more about what you can do with MyChart, go to [email or web address].    Your next appointment:   Follow up after Echo and Zio results     The format for your next appointment:   In Person  Provider:   Kate Sable, MD   Other Instructions   Echocardiogram An echocardiogram is a procedure that uses painless sound waves (ultrasound) to  produce an image of the heart. Images from an echocardiogram can provide important information about:  Signs of coronary artery disease (CAD).  Aneurysm detection. An aneurysm is a weak or damaged part of an artery wall that bulges out from the normal force of blood pumping through the body.  Heart size and shape. Changes in the size or shape of the heart can be associated with certain conditions, including heart failure, aneurysm, and CAD.  Heart muscle function.  Heart valve function.  Signs of a past heart attack.  Fluid buildup around the heart.  Thickening of the heart muscle.  A tumor or infectious growth around the heart valves. Tell a health care provider about:  Any allergies you have.  All medicines you are taking, including vitamins, herbs, eye drops, creams, and over-the-counter medicines.  Any blood disorders you have.  Any surgeries you have had.  Any medical conditions you have.  Whether you are pregnant or may be pregnant. What are the risks? Generally, this is a safe procedure. However, problems may occur, including:  Allergic reaction to dye (contrast) that may be used during the procedure. What happens before the procedure? No specific preparation is needed. You may eat and drink normally. What happens during the procedure?   An IV tube may be inserted into one of your veins.  You may receive contrast through this tube. A contrast is an injection that improves the quality of the pictures from your heart.  A gel will be applied to your chest.  A wand-like tool (transducer) will be moved over your chest. The gel will help to transmit the sound waves from the transducer.  The sound waves will harmlessly bounce off of your heart to allow  the heart images to be captured in real-time motion. The images will be recorded on a computer. The procedure may vary among health care providers and hospitals. What happens after the procedure?  You may return to your normal, everyday life, including diet, activities, and medicines, unless your health care provider tells you not to do that. Summary  An echocardiogram is a procedure that uses painless sound waves (ultrasound) to produce an image of the heart.  Images from an echocardiogram can provide important information about the size and shape of your heart, heart muscle function, heart valve function, and fluid buildup around your heart.  You do not need to do anything to prepare before this procedure. You may eat and drink normally.  After the echocardiogram is completed, you may return to your normal, everyday life, unless your health care provider tells you not to do that. This information is not intended to replace advice given to you by your health care provider. Make sure you discuss any questions you have with your health care provider. Document Revised: 09/21/2018 Document Reviewed: 07/03/2016 Elsevier Patient Education  2020 [place], Kate Sable, MD  12/24/2019 12:45 PM    [place]

## 2020-01-25 ENCOUNTER — Telehealth: Payer: Self-pay | Admitting: Cardiology

## 2020-01-25 NOTE — Telephone Encounter (Signed)
Attempted to call the patient. No answer- I left a message to please call back for results.  

## 2020-01-25 NOTE — Telephone Encounter (Signed)
Kate Sable, MD  01/23/2020 6:21 PM EDT Back to Top    Occasional paroxysmal SVT is noted. Patient triggered events also associated with PVCs, ventricular ectopies. Obtain echocardiogram as currently scheduled. Start Toprol-XL 25 mg daily to help with patient's symptoms. Keep follow-up appointment.

## 2020-01-25 NOTE — Telephone Encounter (Signed)
I spoke with the patient.  She is aware of her heart monitor results and Dr. Thereasa Solo reocmmendations to start metoprolol succinate 25 mg once daily.  The patient voices understanding of her results. She would like to wait to start metoprolol until after her echo is completed and she has her follow up appointment with Dr. Garen Lah.  I advised this was fine. Will forward to Dr. Garen Lah as an Juluis Rainier.

## 2020-01-28 ENCOUNTER — Other Ambulatory Visit: Payer: 59

## 2020-01-31 ENCOUNTER — Other Ambulatory Visit: Payer: Self-pay | Admitting: Cardiology

## 2020-01-31 DIAGNOSIS — Z8679 Personal history of other diseases of the circulatory system: Secondary | ICD-10-CM

## 2020-02-01 ENCOUNTER — Ambulatory Visit (INDEPENDENT_AMBULATORY_CARE_PROVIDER_SITE_OTHER): Payer: 59

## 2020-02-01 ENCOUNTER — Other Ambulatory Visit: Payer: Self-pay

## 2020-02-01 DIAGNOSIS — Z8679 Personal history of other diseases of the circulatory system: Secondary | ICD-10-CM | POA: Diagnosis not present

## 2020-02-01 LAB — ECHOCARDIOGRAM COMPLETE
AR max vel: 2.54 cm2
AV Area VTI: 2.69 cm2
AV Area mean vel: 2.32 cm2
AV Mean grad: 3 mmHg
AV Peak grad: 6.3 mmHg
Ao pk vel: 1.25 m/s
Calc EF: 40.7 %
S' Lateral: 4.1 cm
Single Plane A2C EF: 36.6 %
Single Plane A4C EF: 42.9 %

## 2020-02-04 ENCOUNTER — Other Ambulatory Visit: Payer: Self-pay

## 2020-02-04 ENCOUNTER — Ambulatory Visit (INDEPENDENT_AMBULATORY_CARE_PROVIDER_SITE_OTHER): Payer: 59 | Admitting: Cardiology

## 2020-02-04 ENCOUNTER — Encounter: Payer: Self-pay | Admitting: Cardiology

## 2020-02-04 VITALS — BP 150/100 | HR 84 | Ht 61.5 in | Wt 170.0 lb

## 2020-02-04 DIAGNOSIS — Z01812 Encounter for preprocedural laboratory examination: Secondary | ICD-10-CM

## 2020-02-04 DIAGNOSIS — I5020 Unspecified systolic (congestive) heart failure: Secondary | ICD-10-CM | POA: Diagnosis not present

## 2020-02-04 DIAGNOSIS — I10 Essential (primary) hypertension: Secondary | ICD-10-CM

## 2020-02-04 DIAGNOSIS — R002 Palpitations: Secondary | ICD-10-CM

## 2020-02-04 DIAGNOSIS — E7800 Pure hypercholesterolemia, unspecified: Secondary | ICD-10-CM

## 2020-02-04 MED ORDER — ENTRESTO 24-26 MG PO TABS: 1.0000 | ORAL_TABLET | Freq: Two times a day (BID) | ORAL | 3 refills | Status: DC

## 2020-02-04 MED ORDER — CARVEDILOL 6.25 MG PO TABS: 6.2500 mg | ORAL_TABLET | Freq: Two times a day (BID) | ORAL | 3 refills | Status: DC

## 2020-02-04 NOTE — Patient Instructions (Signed)
Medication Instructions:  Your physician has recommended you make the following change in your medication:   1. START sacubitril-valsartan (ENTRESTO) 24-26 MG:  Take 1 tablet by mouth 2 (two) times daily. 2. START carvedilol (COREG) 6.25 MG tablet: Take 1 tablet (6.25 mg total) by mouth 2 (two) times daily. *If you need a refill on your cardiac medications before your next appointment, please call your pharmacy*   Lab Work: Your physician recommends that you return for lab work prior to your Cath lab procedure. (your physician also recommends that you have a FASTING lipid profile which can be done at this time.)  - You will need to be fasting. Please do not have anything to eat or drink after midnight the morning you have the lab work. You may only have water or black coffee with no cream or sugar.  - Please go to the [facility] You will check in at the front desk to the right as you walk into the atrium. Valet Parking is offered if needed. - No appointment needed. You may go any day between 7 am and 6 pm.  If you have labs (blood work) drawn today and your tests are completely normal, you will receive your results only by: Marland Kitchen MyChart Message (if you have MyChart) OR . A paper copy in the mail If you have any lab test that is abnormal or we need to change your treatment, we will call you to review the results.   Testing/Procedures:  **[facility] drive thru on Thurs 1/88, plus labs drawn.   [facility] Cardiac Cath Instructions   You are scheduled for a Cardiac Cath on:______Monday 8/30___________________  Please arrive at __0830_____am on the day of your procedure  Please expect a call from our [place] to pre-register you  Do not eat/drink anything after midnight  Someone will need to drive you home  It is recommended someone be with you for the first 24 hours after your procedure  Wear clothes that are easy to get on/off and wear slip on shoes if  possible   Medications bring a current list of all medications with you  ___ You may take all ( except listed below) of your medications the morning of your procedure with enough water to swallow safely  __XX_ Do not take these medications before your procedure:  Entresto    Day of your procedure: Arrive at the [place] entrance.  Free valet service is available.  After entering the [facility] please check-in at the registration desk (1st desk on your right) to receive your armband. After receiving your armband someone will escort you to the cardiac cath/special procedures waiting area.  The usual length of stay after your procedure is about 2 to 3 hours.  This can vary.  If you have any questions, please call our office at [phone], or you may call the cardiac cath lab at [facility] directly at [phone]   Follow-Up: At [facility], you and your health needs are our priority.  As part of our continuing mission to provide you with exceptional heart care, we have created designated Provider Care Teams.  These Care Teams include your primary Cardiologist (physician) and Advanced Practice Providers (APPs -  Physician Assistants and Nurse Practitioners) who all work together to provide you with the care you need, when you need it.  We recommend signing up for the patient portal called "MyChart".  Sign up information is provided on this After Visit Summary.  MyChart is  used to connect with patients for Virtual Visits (Telemedicine).  Patients are able to view lab/test results, encounter notes, upcoming appointments, etc.  Non-urgent messages can be sent to your provider as well.   To learn more about what you can do with MyChart, go to [email or web address].    Your next appointment:   After Cath on 8/30   The format for your next appointment:   In Person  Provider:   Kate Sable, MD   Other Instructions

## 2020-02-04 NOTE — Progress Notes (Signed)
Cardiology Office Note:    Date:  02/04/2020   ID:  Leslie Esparza, DOB 1971-04-24, MRN [id number]  PCP:  Cyndi Bender, PA-C  [facility] Cardiologist:  Kate Sable, MD  [place] Electrophysiologist:  None   Referring MD: Cyndi Bender, PA-C   Chief Complaint  Patient presents with   office visit    F/U after cardiac testing; Meds verbally reviewed with patient.     History of Present Illness:    Leslie Esparza is a 49 y.o. female with a hx of migraine, hyperlipidemia who presents for follow-up.  She was last seen due to palpitations lasting 1 month.  Cardiac monitor was ordered to evaluate any significant arrhythmias.  Patient also states having a history of mitral valve prolapse, echocardiogram ordered to evaluate any valvular pathology.  She denies any symptoms of chest pain, shortness of breath at rest or with exertion.  She still has occasional palpitations.  Denies any history of heart disease.  Past Medical History:  Diagnosis Date   Allergic rhinitis    Common migraine    Mitral valve prolapse    OCD (obsessive compulsive disorder)    Osteoarthritis of thumb    Recurrent cold sores     Past Surgical History:  Procedure Laterality Date   CESAREAN SECTION     WISDOM TOOTH EXTRACTION      Current Medications: Current Meds  Medication Sig   cholecalciferol (VITAMIN D) 1000 units tablet Take 1,000 Units by mouth daily.   famotidine (PEPCID) 20 MG tablet Take 20 mg by mouth at bedtime as needed for heartburn or indigestion.   FLUoxetine (PROZAC) 40 MG capsule Take 80 mg by mouth daily.   NALTREXONE HCL PO Take 1 tablet by mouth as needed.   SUMAtriptan (IMITREX) 100 MG tablet Take 100 mg by mouth every 2 (two) hours as needed for migraine or headache. May repeat in 2 hours if headache persists or recurs.     Allergies:   Penicillins   Social History   Socioeconomic History   Marital status: Married    Spouse name: Not  on file   Number of children: Not on file   Years of education: Not on file   Highest education level: Not on file  Occupational History   Not on file  Tobacco Use   Smoking status: Never Smoker   Smokeless tobacco: Never Used  Vaping Use   Vaping Use: Never used  Substance and Sexual Activity   Alcohol use: No    Alcohol/week: 0.0 standard drinks   Drug use: No   Sexual activity: Not on file  Other Topics Concern   Not on file  Social History Narrative   Not on file   Social Determinants of Health   Financial Resource Strain:    Difficulty of Paying Living Expenses: Not on file  Food Insecurity:    Worried About [place] in the Last Year: Not on file   Ran Out of Food in the Last Year: Not on file  Transportation Needs:    Lack of Transportation (Medical): Not on file   Lack of Transportation (Non-Medical): Not on file  Physical Activity:    Days of Exercise per Week: Not on file   Minutes of Exercise per Session: Not on file  Stress:    Feeling of Stress : Not on file  Social Connections:    Frequency of Communication with Friends and Family: Not on file   Frequency of Social  Gatherings with Friends and Family: Not on file   Attends Religious Services: Not on file   Active Member of Clubs or Organizations: Not on file   Attends Archivist Meetings: Not on file   Marital Status: Not on file     Family History: The patient's family history includes Heart disease in her paternal grandfather; Thyroid disease in her father.  ROS:   Please see the history of present illness.     All other systems reviewed and are negative.  EKGs/Labs/Other Studies Reviewed:    The following studies were reviewed today:   EKG:  EKG is not  ordered today.    Recent Labs: No results found for requested labs within last 8760 hours.  Recent Lipid Panel No results found for: CHOL, TRIG, HDL, CHOLHDL, VLDL, LDLCALC,  LDLDIRECT  Physical Exam:    VS:  BP (!) 150/100 (BP Location: Left Arm, Patient Position: Sitting, Cuff Size: Normal)    Pulse 84    Ht 5' 1.5" (1.562 m)    Wt 170 lb (77.1 kg)    SpO2 97%    BMI 31.60 kg/m     Wt Readings from Last 3 Encounters:  02/04/20 170 lb (77.1 kg)  12/24/19 168 lb 8 oz (76.4 kg)  05/17/16 170 lb (77.1 kg)     GEN:  Well nourished, well developed in no acute distress HEENT: Normal NECK: No JVD; No carotid bruits LYMPHATICS: No lymphadenopathy CARDIAC: RRR, no murmurs, rubs, gallops RESPIRATORY:  Clear to auscultation without rales, wheezing or rhonchi  ABDOMEN: Soft, non-tender, non-distended MUSCULOSKELETAL:  No edema; No deformity  SKIN: Warm and dry NEUROLOGIC:  Alert and oriented x 3 PSYCHIATRIC:  Normal affect   ASSESSMENT:    1. Palpitations   2. HFrEF (heart failure with reduced ejection fraction) ([place])   3. Essential hypertension   4. Pure hypercholesterolemia   5. Pre-procedure lab exam    PLAN:    In order of problems listed above:  1. Patient with  history of palpitations.  Cardiac monitor showed occasional paroxysmal SVT, occasional 6-8 beats of VT noted with patient triggered events.  Patient triggered events also associated with PVCs.  Overall as tolerated SVTs and PVCs were rare, less than 1%.  Start coreg 6.25mg  bid.   2. Echocardiogram did not show any evidence for mitral valve prolapse.  Moderate systolic dysfunction noted EF 35%.  Echo Reviewed by myself showing septal wall akinesis.  We will schedule patient for left heart cath. Start coreg and entresto 24-26mg  bid. 3. Patient's blood pressure is elevated.  Was elevated after last visit.  She fits criteria for diagnosis of hypertension.  Start Coreg and [place] as above. 4. Patient with history of hyperlipidemia.  Get fasting lipid profile.  Follow-up after left heart cath and fasting lipid profile.  Total encounter time 40 minutes  Greater than 50% was spent in counseling  and coordination of care with the patient   This note was generated in part or whole with voice recognition software. Voice recognition is usually quite accurate but there are transcription errors that can and very often do occur. I apologize for any typographical errors that were not detected and corrected.  Medication Adjustments/Labs and Tests Ordered: Current medicines are reviewed at length with the patient today.  Concerns regarding medicines are outlined above.  Orders Placed This Encounter  Procedures   Lipid panel   CBC   Basic metabolic panel   Meds ordered this encounter  Medications  carvedilol (COREG) 6.25 MG tablet    Sig: Take 1 tablet (6.25 mg total) by mouth 2 (two) times daily.    Dispense:  60 tablet    Refill:  3   sacubitril-valsartan (ENTRESTO) 24-26 MG    Sig: Take 1 tablet by mouth 2 (two) times daily.    Dispense:  60 tablet    Refill:  3    Patient Instructions  Medication Instructions:  Your physician has recommended you make the following change in your medication:   1. START sacubitril-valsartan (ENTRESTO) 24-26 MG:  Take 1 tablet by mouth 2 (two) times daily. 2. START carvedilol (COREG) 6.25 MG tablet: Take 1 tablet (6.25 mg total) by mouth 2 (two) times daily. *If you need a refill on your cardiac medications before your next appointment, please call your pharmacy*   Lab Work: Your physician recommends that you return for lab work prior to your Cath lab procedure. (your physician also recommends that you have a FASTING lipid profile which can be done at this time.)  - You will need to be fasting. Please do not have anything to eat or drink after midnight the morning you have the lab work. You may only have water or black coffee with no cream or sugar.  - Please go to the [facility] You will check in at the front desk to the right as you walk into the atrium. Valet Parking is offered if needed. - No appointment needed. You may go  any day between 7 am and 6 pm.  If you have labs (blood work) drawn today and your tests are completely normal, you will receive your results only by:  [place] (if you have MyChart) OR  A paper copy in the mail If you have any lab test that is abnormal or we need to change your treatment, we will call you to review the results.   Testing/Procedures:  **[place] drive thru on Thurs 1/61, plus labs drawn.   [facility] Cardiac Cath Instructions   You are scheduled for a Cardiac Cath on:______Monday 8/30___________________  Please arrive at __0830_____am on the day of your procedure  Please expect a call from our [place] to pre-register you  Do not eat/drink anything after midnight  Someone will need to drive you home  It is recommended someone be with you for the first 24 hours after your procedure  Wear clothes that are easy to get on/off and wear slip on shoes if possible   Medications bring a current list of all medications with you  ___ You may take all ( except listed below) of your medications the morning of your procedure with enough water to swallow safely  __XX_ Do not take these medications before your procedure:  Entresto    Day of your procedure: Arrive at the [place] entrance.  Free valet service is available.  After entering the [place] please check-in at the registration desk (1st desk on your right) to receive your armband. After receiving your armband someone will escort you to the cardiac cath/special procedures waiting area.  The usual length of stay after your procedure is about 2 to 3 hours.  This can vary.  If you have any questions, please call our office at [phone], or you may call the cardiac cath lab at [facility] directly at [phone]   Follow-Up: At [facility], you and your health needs are our priority.  As part of our continuing mission to provide you with  exceptional heart care, we have created  designated Provider Care Teams.  These Care Teams include your primary Cardiologist (physician) and Advanced Practice Providers (APPs -  Physician Assistants and Nurse Practitioners) who all work together to provide you with the care you need, when you need it.  We recommend signing up for the patient portal called "MyChart".  Sign up information is provided on this After Visit Summary.  MyChart is used to connect with patients for Virtual Visits (Telemedicine).  Patients are able to view lab/test results, encounter notes, upcoming appointments, etc.  Non-urgent messages can be sent to your provider as well.   To learn more about what you can do with MyChart, go to [email or web address].    Your next appointment:   After Cath on 8/30   The format for your next appointment:   In Person  Provider:   Kate Sable, MD   Other Instructions      Signed, Kate Sable, MD  02/04/2020 12:22 PM    [employer]

## 2020-02-04 NOTE — Addendum Note (Signed)
Addended by: Kate Sable on: 02/04/2020 12:40 PM   Modules accepted: Orders, SmartSet

## 2020-02-04 NOTE — H&P (View-Only) (Signed)
Cardiology Office Note:    Date:  02/04/2020   ID:  Leslie Esparza, DOB 1971-01-05, MRN [id number]  PCP:  Cyndi Bender, PA-C  [facility] Cardiologist:  Kate Sable, MD  [place] Electrophysiologist:  None   Referring MD: Cyndi Bender, PA-C   Chief Complaint  Patient presents with  . office visit    F/U after cardiac testing; Meds verbally reviewed with patient.     History of Present Illness:    Leslie Esparza is a 49 y.o. female with a hx of migraine, hyperlipidemia who presents for follow-up.  She was last seen due to palpitations lasting 1 month.  Cardiac monitor was ordered to evaluate any significant arrhythmias.  Patient also states having a history of mitral valve prolapse, echocardiogram ordered to evaluate any valvular pathology.  She denies any symptoms of chest pain, shortness of breath at rest or with exertion.  She still has occasional palpitations.  Denies any history of heart disease.  Past Medical History:  Diagnosis Date  . Allergic rhinitis   . Common migraine   . Mitral valve prolapse   . OCD (obsessive compulsive disorder)   . Osteoarthritis of thumb   . Recurrent cold sores     Past Surgical History:  Procedure Laterality Date  . CESAREAN SECTION    . WISDOM TOOTH EXTRACTION      Current Medications: Current Meds  Medication Sig  . cholecalciferol (VITAMIN D) 1000 units tablet Take 1,000 Units by mouth daily.  . famotidine (PEPCID) 20 MG tablet Take 20 mg by mouth at bedtime as needed for heartburn or indigestion.  [place] Kitchen FLUoxetine (PROZAC) 40 MG capsule Take 80 mg by mouth daily.  Marland Kitchen NALTREXONE HCL PO Take 1 tablet by mouth as needed.  . SUMAtriptan (IMITREX) 100 MG tablet Take 100 mg by mouth every 2 (two) hours as needed for migraine or headache. May repeat in 2 hours if headache persists or recurs.     Allergies:   Penicillins   Social History   Socioeconomic History  . Marital status: Married    Spouse name: Not  on file  . Number of children: Not on file  . Years of education: Not on file  . Highest education level: Not on file  Occupational History  . Not on file  Tobacco Use  . Smoking status: Never Smoker  . Smokeless tobacco: Never Used  Vaping Use  . Vaping Use: Never used  Substance and Sexual Activity  . Alcohol use: No    Alcohol/week: 0.0 standard drinks  . Drug use: No  . Sexual activity: Not on file  Other Topics Concern  . Not on file  Social History Narrative  . Not on file   Social Determinants of Health   Financial Resource Strain:   . Difficulty of Paying Living Expenses: Not on file  Food Insecurity:   . Worried About Charity fundraiser in the Last Year: Not on file  . Ran Out of Food in the Last Year: Not on file  Transportation Needs:   . Lack of Transportation (Medical): Not on file  . Lack of Transportation (Non-Medical): Not on file  Physical Activity:   . Days of Exercise per Week: Not on file  . Minutes of Exercise per Session: Not on file  Stress:   . Feeling of Stress : Not on file  Social Connections:   . Frequency of Communication with Friends and Family: Not on file  . Frequency of Social  Gatherings with Friends and Family: Not on file  . Attends Religious Services: Not on file  . Active Member of Clubs or Organizations: Not on file  . Attends Archivist Meetings: Not on file  . Marital Status: Not on file     Family History: The patient's family history includes Heart disease in her paternal grandfather; Thyroid disease in her father.  ROS:   Please see the history of present illness.     All other systems reviewed and are negative.  EKGs/Labs/Other Studies Reviewed:    The following studies were reviewed today:   EKG:  EKG is not  ordered today.    Recent Labs: No results found for requested labs within last 8760 hours.  Recent Lipid Panel No results found for: CHOL, TRIG, HDL, CHOLHDL, VLDL, LDLCALC,  LDLDIRECT  Physical Exam:    VS:  BP (!) 150/100 (BP Location: Left Arm, Patient Position: Sitting, Cuff Size: Normal)   Pulse 84   Ht 5' 1.5" (1.562 m)   Wt 170 lb (77.1 kg)   SpO2 97%   BMI 31.60 kg/m     Wt Readings from Last 3 Encounters:  02/04/20 170 lb (77.1 kg)  12/24/19 168 lb 8 oz (76.4 kg)  05/17/16 170 lb (77.1 kg)     GEN:  Well nourished, well developed in no acute distress HEENT: Normal NECK: No JVD; No carotid bruits LYMPHATICS: No lymphadenopathy CARDIAC: RRR, no murmurs, rubs, gallops RESPIRATORY:  Clear to auscultation without rales, wheezing or rhonchi  ABDOMEN: Soft, non-tender, non-distended MUSCULOSKELETAL:  No edema; No deformity  SKIN: Warm and dry NEUROLOGIC:  Alert and oriented x 3 PSYCHIATRIC:  Normal affect   ASSESSMENT:    1. Palpitations   2. HFrEF (heart failure with reduced ejection fraction) ([place])   3. Essential hypertension   4. Pure hypercholesterolemia   5. Pre-procedure lab exam    PLAN:    In order of problems listed above:  1. Patient with  history of palpitations.  Cardiac monitor showed occasional paroxysmal SVT, occasional 6-8 beats of VT noted with patient triggered events.  Patient triggered events also associated with PVCs.  Overall as tolerated SVTs and PVCs were rare, less than 1%.  Start coreg 6.25mg  bid.   2. Echocardiogram did not show any evidence for mitral valve prolapse.  Moderate systolic dysfunction noted EF 35%.  Echo Reviewed by myself showing septal wall akinesis.  We will schedule patient for left heart cath. Start coreg and entresto 24-26mg  bid. 3. Patient's blood pressure is elevated.  Was elevated after last visit.  She fits criteria for diagnosis of hypertension.  Start Coreg and [place] as above. 4. Patient with history of hyperlipidemia.  Get fasting lipid profile.  Follow-up after left heart cath and fasting lipid profile.  Total encounter time 40 minutes  Greater than 50% was spent in counseling  and coordination of care with the patient   This note was generated in part or whole with voice recognition software. Voice recognition is usually quite accurate but there are transcription errors that can and very often do occur. I apologize for any typographical errors that were not detected and corrected.  Medication Adjustments/Labs and Tests Ordered: Current medicines are reviewed at length with the patient today.  Concerns regarding medicines are outlined above.  Orders Placed This Encounter  Procedures  . Lipid panel  . CBC  . Basic metabolic panel   Meds ordered this encounter  Medications  . carvedilol (COREG) 6.25 MG  tablet    Sig: Take 1 tablet (6.25 mg total) by mouth 2 (two) times daily.    Dispense:  60 tablet    Refill:  3  . sacubitril-valsartan (ENTRESTO) 24-26 MG    Sig: Take 1 tablet by mouth 2 (two) times daily.    Dispense:  60 tablet    Refill:  3    Patient Instructions  Medication Instructions:  Your physician has recommended you make the following change in your medication:   1. START sacubitril-valsartan (ENTRESTO) 24-26 MG:  Take 1 tablet by mouth 2 (two) times daily. 2. START carvedilol (COREG) 6.25 MG tablet: Take 1 tablet (6.25 mg total) by mouth 2 (two) times daily. *If you need a refill on your cardiac medications before your next appointment, please call your pharmacy*   Lab Work: Your physician recommends that you return for lab work prior to your Cath lab procedure. (your physician also recommends that you have a FASTING lipid profile which can be done at this time.)  - You will need to be fasting. Please do not have anything to eat or drink after midnight the morning you have the lab work. You may only have water or black coffee with no cream or sugar.  - Please go to the [facility]. You will check in at the front desk to the right as you walk into the atrium. Valet Parking is offered if needed. - No appointment needed. You may go  any day between 7 am and 6 pm.  If you have labs (blood work) drawn today and your tests are completely normal, you will receive your results only by: [place] MyChart Message (if you have MyChart) OR . A paper copy in the mail If you have any lab test that is abnormal or we need to change your treatment, we will call you to review the results.   Testing/Procedures:  **[place] drive thru on Thurs 7/82, plus labs drawn.   [facility] Cardiac Cath Instructions   You are scheduled for a Cardiac Cath on:______Monday 8/30___________________  Please arrive at __0830_____am on the day of your procedure  Please expect a call from our [place] to pre-register you  Do not eat/drink anything after midnight  Someone will need to drive you home  It is recommended someone be with you for the first 24 hours after your procedure  Wear clothes that are easy to get on/off and wear slip on shoes if possible   Medications bring a current list of all medications with you  ___ You may take all ( except listed below) of your medications the morning of your procedure with enough water to swallow safely  __XX_ Do not take these medications before your procedure:  Entresto    Day of your procedure: Arrive at the [place] entrance.  Free valet service is available.  After entering the [place] please check-in at the registration desk (1st desk on your right) to receive your armband. After receiving your armband someone will escort you to the cardiac cath/special procedures waiting area.  The usual length of stay after your procedure is about 2 to 3 hours.  This can vary.  If you have any questions, please call our office at [phone], or you may call the cardiac cath lab at [facility] directly at [phone]   Follow-Up: At [facility], you and your health needs are our priority.  As part of our continuing mission to provide you with exceptional heart care, we  have created  designated Provider Care Teams.  These Care Teams include your primary Cardiologist (physician) and Advanced Practice Providers (APPs -  Physician Assistants and Nurse Practitioners) who all work together to provide you with the care you need, when you need it.  We recommend signing up for the patient portal called "MyChart".  Sign up information is provided on this After Visit Summary.  MyChart is used to connect with patients for Virtual Visits (Telemedicine).  Patients are able to view lab/test results, encounter notes, upcoming appointments, etc.  Non-urgent messages can be sent to your provider as well.   To learn more about what you can do with MyChart, go to [email or web address].    Your next appointment:   After Cath on 8/30   The format for your next appointment:   In Person  Provider:   Kate Sable, MD   Other Instructions      Signed, Kate Sable, MD  02/04/2020 12:22 PM    [place]

## 2020-02-07 ENCOUNTER — Other Ambulatory Visit
Admission: RE | Admit: 2020-02-07 | Discharge: 2020-02-07 | Disposition: A | Discharge status: Home / Self Care | Payer: 59 | Source: Ambulatory Visit | Attending: Cardiovascular Disease | Admitting: Cardiovascular Disease

## 2020-02-07 ENCOUNTER — Other Ambulatory Visit: Payer: Self-pay

## 2020-02-07 ENCOUNTER — Other Ambulatory Visit
Admission: RE | Admit: 2020-02-07 | Discharge: 2020-02-07 | Disposition: A | Discharge status: Home / Self Care | Payer: 59 | Source: Ambulatory Visit | Attending: Cardiology | Admitting: Cardiology

## 2020-02-07 DIAGNOSIS — E7800 Pure hypercholesterolemia, unspecified: Secondary | ICD-10-CM | POA: Diagnosis not present

## 2020-02-07 DIAGNOSIS — Z01812 Encounter for preprocedural laboratory examination: Secondary | ICD-10-CM

## 2020-02-07 DIAGNOSIS — I10 Essential (primary) hypertension: Secondary | ICD-10-CM | POA: Insufficient documentation

## 2020-02-07 DIAGNOSIS — Z20822 Contact with and (suspected) exposure to covid-19: Secondary | ICD-10-CM | POA: Insufficient documentation

## 2020-02-07 LAB — BASIC METABOLIC PANEL
Anion gap: 10 (ref 5–15)
BUN: 13 mg/dL (ref 6–20)
CO2: 25 mmol/L (ref 22–32)
Calcium: 9.2 mg/dL (ref 8.9–10.3)
Chloride: 101 mmol/L (ref 98–111)
Creatinine, Ser: 0.66 mg/dL (ref 0.44–1.00)
GFR calc Af Amer: >60 mL/min (ref >60)
GFR calc non Af Amer: >60 mL/min (ref >60)
Glucose, Bld: 105 mg/dL — ABNORMAL HIGH (ref 70–99)
Potassium: 4.4 mmol/L (ref 3.5–5.1)
Sodium: 136 mmol/L (ref 135–145)

## 2020-02-07 LAB — LIPID PANEL
Cholesterol: 259 mg/dL — ABNORMAL HIGH (ref 0–200)
HDL: 61 mg/dL (ref >40)
LDL Cholesterol: 165 mg/dL — ABNORMAL HIGH (ref 0–99)
Total CHOL/HDL Ratio: 4.2 RATIO
Triglycerides: 165 mg/dL — ABNORMAL HIGH (ref <150)
VLDL: 33 mg/dL (ref 0–40)

## 2020-02-07 LAB — SARS CORONAVIRUS 2 (TAT 6-24 HRS): SARS Coronavirus 2: NEGATIVE

## 2020-02-07 LAB — CBC
HCT: 41.2 % (ref 36.0–46.0)
Hemoglobin: 14.4 g/dL (ref 12.0–15.0)
MCH: 29.5 pg (ref 26.0–34.0)
MCHC: 35 g/dL (ref 30.0–36.0)
MCV: 84.4 fL (ref 80.0–100.0)
Platelets: 467 10*3/uL — ABNORMAL HIGH (ref 150–400)
RBC: 4.88 MIL/uL (ref 3.87–5.11)
RDW: 13.2 % (ref 11.5–15.5)
WBC: 7.6 10*3/uL (ref 4.0–10.5)
nRBC: 0 % (ref 0.0–0.2)

## 2020-02-11 ENCOUNTER — Encounter: Payer: Self-pay | Admitting: Cardiovascular Disease

## 2020-02-11 ENCOUNTER — Ambulatory Visit
Admission: RE | Admit: 2020-02-11 | Discharge: 2020-02-11 | Disposition: A | Discharge status: Home / Self Care | Payer: 59 | Attending: Cardiovascular Disease | Admitting: Cardiovascular Disease

## 2020-02-11 ENCOUNTER — Other Ambulatory Visit: Payer: Self-pay

## 2020-02-11 ENCOUNTER — Encounter
Admission: RE | Disposition: A | Discharge status: Home / Self Care | Payer: 59 | Source: Home / Self Care | Attending: Cardiovascular Disease

## 2020-02-11 DIAGNOSIS — E785 Hyperlipidemia, unspecified: Secondary | ICD-10-CM | POA: Insufficient documentation

## 2020-02-11 DIAGNOSIS — I5020 Unspecified systolic (congestive) heart failure: Secondary | ICD-10-CM | POA: Insufficient documentation

## 2020-02-11 DIAGNOSIS — I428 Other cardiomyopathies: Secondary | ICD-10-CM | POA: Insufficient documentation

## 2020-02-11 DIAGNOSIS — F429 Obsessive-compulsive disorder, unspecified: Secondary | ICD-10-CM | POA: Diagnosis not present

## 2020-02-11 DIAGNOSIS — I341 Nonrheumatic mitral (valve) prolapse: Secondary | ICD-10-CM | POA: Insufficient documentation

## 2020-02-11 DIAGNOSIS — E7800 Pure hypercholesterolemia, unspecified: Secondary | ICD-10-CM | POA: Diagnosis not present

## 2020-02-11 DIAGNOSIS — R002 Palpitations: Secondary | ICD-10-CM | POA: Diagnosis not present

## 2020-02-11 DIAGNOSIS — Z79899 Other long term (current) drug therapy: Secondary | ICD-10-CM | POA: Insufficient documentation

## 2020-02-11 DIAGNOSIS — I110 Hypertensive heart disease with heart failure: Secondary | ICD-10-CM | POA: Diagnosis not present

## 2020-02-11 DIAGNOSIS — Z880 Allergy status to penicillin: Secondary | ICD-10-CM | POA: Insufficient documentation

## 2020-02-11 HISTORY — PX: LEFT HEART CATH AND CORONARY ANGIOGRAPHY: CATH118249

## 2020-02-11 LAB — PREGNANCY, URINE: Preg Test, Ur: NEGATIVE

## 2020-02-11 SURGERY — LEFT HEART CATH AND CORONARY ANGIOGRAPHY
Anesthesia: Moderate Sedation | Laterality: Left

## 2020-02-11 MED ORDER — MIDAZOLAM HCL 2 MG/2ML IJ SOLN
INTRAMUSCULAR | Status: DC | PRN
  Administered 2020-02-11: 1 mg via INTRAVENOUS

## 2020-02-11 MED ORDER — IOHEXOL 300 MG/ML  SOLN
INTRAMUSCULAR | Status: DC | PRN
  Administered 2020-02-11: 40 mL

## 2020-02-11 MED ORDER — SODIUM CHLORIDE 0.9 % IV SOLN: INTRAVENOUS | Status: DC

## 2020-02-11 MED ORDER — SODIUM CHLORIDE 0.9% FLUSH: 3.0000 mL | Freq: Two times a day (BID) | INTRAVENOUS | Status: DC

## 2020-02-11 MED ORDER — VERAPAMIL HCL 2.5 MG/ML IV SOLN
INTRAVENOUS | Status: DC | PRN
  Administered 2020-02-11: 2.5 mg via INTRA_ARTERIAL

## 2020-02-11 MED ORDER — HEPARIN (PORCINE) IN NACL 1000-0.9 UT/500ML-% IV SOLN
INTRAVENOUS | Status: DC | PRN
  Administered 2020-02-11 (×2): 500 mL

## 2020-02-11 MED ORDER — HEPARIN (PORCINE) IN NACL 1000-0.9 UT/500ML-% IV SOLN
INTRAVENOUS | Status: AC
  Filled 2020-02-11: qty 1000

## 2020-02-11 MED ORDER — HEPARIN SODIUM (PORCINE) 1000 UNIT/ML IJ SOLN
INTRAMUSCULAR | Status: DC | PRN
  Administered 2020-02-11: 4000 [IU] via INTRAVENOUS

## 2020-02-11 MED ORDER — FENTANYL CITRATE (PF) 100 MCG/2ML IJ SOLN
INTRAMUSCULAR | Status: DC | PRN
Start: 2020-02-11 — End: 2020-02-11
  Administered 2020-02-11: 25 ug via INTRAVENOUS

## 2020-02-11 MED ORDER — SODIUM CHLORIDE 0.9 % IV SOLN: 250.0000 mL | INTRAVENOUS | Status: DC | PRN

## 2020-02-11 MED ORDER — VERAPAMIL HCL 2.5 MG/ML IV SOLN
INTRAVENOUS | Status: AC
  Filled 2020-02-11: qty 2

## 2020-02-11 MED ORDER — MIDAZOLAM HCL 2 MG/2ML IJ SOLN
INTRAMUSCULAR | Status: AC
  Filled 2020-02-11: qty 2

## 2020-02-11 MED ORDER — SODIUM CHLORIDE 0.9% FLUSH: 3.0000 mL | INTRAVENOUS | Status: DC | PRN

## 2020-02-11 MED ORDER — FENTANYL CITRATE (PF) 100 MCG/2ML IJ SOLN
INTRAMUSCULAR | Status: AC
  Filled 2020-02-11: qty 2

## 2020-02-11 MED ORDER — HEPARIN SODIUM (PORCINE) 1000 UNIT/ML IJ SOLN
INTRAMUSCULAR | Status: AC
  Filled 2020-02-11: qty 1

## 2020-02-11 SURGICAL SUPPLY — 7 items
CATH INFINITI 5FR JK (CATHETERS) ×3 IMPLANT
DEVICE RAD TR BAND REGULAR (VASCULAR PRODUCTS) ×3 IMPLANT
GLIDESHEATH SLEND SS 6F .021 (SHEATH) ×3 IMPLANT
GUIDEWIRE INQWIRE 1.5J.035X260 (WIRE) ×1 IMPLANT
INQWIRE 1.5J .035X260CM (WIRE) ×3
KIT MANI 3VAL PERCEP (MISCELLANEOUS) ×3 IMPLANT
PACK CARDIAC CATH (CUSTOM PROCEDURE TRAY) ×3 IMPLANT

## 2020-02-11 NOTE — Interval H&P Note (Signed)
Cath Lab Visit (complete for each Cath Lab visit)  Clinical Evaluation Leading to the Procedure:   ACS: No.  Non-ACS:    Anginal Classification: CCS II  Anti-ischemic medical therapy: Minimal Therapy (1 class of medications)  Non-Invasive Test Results: No non-invasive testing performed  Prior CABG: No previous CABG      History and Physical Interval Note:  02/11/2020 10:16 AM  [name]  has presented today for surgery, with the diagnosis of LT Heart Cath   Heart failure with reduced ejection fraction.  The various methods of treatment have been discussed with the patient and family. After consideration of risks, benefits and other options for treatment, the patient has consented to  Procedure(s): LEFT HEART CATH AND CORONARY ANGIOGRAPHY (Left) as a surgical intervention.  The patient's history has been reviewed, patient examined, no change in status, stable for surgery.  I have reviewed the patient's chart and labs.  Questions were answered to the patient's satisfaction.     Leslie Esparza

## 2020-03-06 ENCOUNTER — Other Ambulatory Visit: Payer: Self-pay

## 2020-03-06 ENCOUNTER — Encounter: Payer: Self-pay | Admitting: Cardiology

## 2020-03-06 ENCOUNTER — Ambulatory Visit (INDEPENDENT_AMBULATORY_CARE_PROVIDER_SITE_OTHER): Payer: 59 | Admitting: Cardiology

## 2020-03-06 ENCOUNTER — Telehealth: Payer: Self-pay | Admitting: Cardiology

## 2020-03-06 VITALS — BP 120/80 | HR 80 | Ht 61.5 in | Wt 157.2 lb

## 2020-03-06 DIAGNOSIS — I10 Essential (primary) hypertension: Secondary | ICD-10-CM

## 2020-03-06 DIAGNOSIS — E7800 Pure hypercholesterolemia, unspecified: Secondary | ICD-10-CM | POA: Diagnosis not present

## 2020-03-06 DIAGNOSIS — I428 Other cardiomyopathies: Secondary | ICD-10-CM

## 2020-03-06 MED ORDER — ENTRESTO 49-51 MG PO TABS: 1.0000 | ORAL_TABLET | Freq: Two times a day (BID) | ORAL | 5 refills | Status: DC

## 2020-03-06 NOTE — Telephone Encounter (Signed)
Spoke with patient regarding preferred weekdays for the Cardiac MRI that has been ordered.  Informed patient as soon as we receive prior authorization I will be in touch with appointment.

## 2020-03-06 NOTE — Progress Notes (Signed)
Cardiology Office Note:    Date:  03/06/2020   ID:  Leslie Esparza, DOB 10/18/1970, MRN [id number]  PCP:  Cyndi Bender, PA-C  [facility] Cardiologist:  Kate Sable, MD  [facility] Electrophysiologist:  None   Referring MD: Cyndi Bender, PA-C   Chief Complaint  Patient presents with  . Follow-up    Cath fu, meds reviewed verbally     History of Present Illness:    Leslie Esparza is a 49 y.o. female with a hx of migraine, hypertension, hyperlipidemia, nonischemic cardiomyopathy last EF 35 to 40% who presents for follow-up.   Patient previously seen due to reduced ejection fraction.  She was advised to undergo left heart cath to evaluate for CAD.  She underwent left heart cath on 02/11/2020 with no evidence of CAD.  Echo on 02/01/2020 showed moderately reduced EF, 35 to 40%.  She now presents for follow-up after left heart cath.  Has a history of hyperlipidemia, fasting lipid profile was obtained.  She denies shortness of breath or edema.  Is tolerating Entresto and Coreg without any adverse effects.  Past Medical History:  Diagnosis Date  . Allergic rhinitis   . Common migraine   . Mitral valve prolapse   . OCD (obsessive compulsive disorder)   . Osteoarthritis of thumb   . Recurrent cold sores     Past Surgical History:  Procedure Laterality Date  . CESAREAN SECTION    . LEFT HEART CATH AND CORONARY ANGIOGRAPHY Left 02/11/2020   Procedure: LEFT HEART CATH AND CORONARY ANGIOGRAPHY;  Surgeon: Wellington Hampshire, MD;  Location: [facility];  Service: Cardiovascular;  Laterality: Left;  . WISDOM TOOTH EXTRACTION      Current Medications: Current Meds  Medication Sig  . calcium carbonate (OSCAL) 1500 (600 Ca) MG TABS tablet Take 600 mg of elemental calcium by mouth daily.  . carvedilol (COREG) 6.25 MG tablet Take 1 tablet (6.25 mg total) by mouth 2 (two) times daily.  . Cholecalciferol (VITAMIN D) 125 MCG (5000 UT) CAPS Take 5,000 Units by  mouth daily.  . famotidine (PEPCID) 40 MG tablet Take 40 mg by mouth 2 (two) times daily as needed for heartburn or indigestion.  Marland Kitchen FLUoxetine (PROZAC) 40 MG capsule Take 80 mg by mouth daily.  Marland Kitchen loratadine (CLARITIN) 10 MG tablet Take 10 mg by mouth daily as needed for allergies.  . SUMAtriptan (IMITREX) 100 MG tablet Take 100 mg by mouth every 2 (two) hours as needed for migraine or headache. May repeat in 2 hours if headache persists or recurs.  . [DISCONTINUED] sacubitril-valsartan (ENTRESTO) 24-26 MG Take 1 tablet by mouth 2 (two) times daily.     Allergies:   Penicillins   Social History   Socioeconomic History  . Marital status: Married    Spouse name: Not on file  . Number of children: Not on file  . Years of education: Not on file  . Highest education level: Not on file  Occupational History  . Not on file  Tobacco Use  . Smoking status: Never Smoker  . Smokeless tobacco: Never Used  Vaping Use  . Vaping Use: Never used  Substance and Sexual Activity  . Alcohol use: No    Alcohol/week: 0.0 standard drinks  . Drug use: No  . Sexual activity: Not on file  Other Topics Concern  . Not on file  Social History Narrative  . Not on file   Social Determinants of Health   Financial Resource Strain:   .  Difficulty of Paying Living Expenses: Not on file  Food Insecurity:   . Worried About Charity fundraiser in the Last Year: Not on file  . Ran Out of Food in the Last Year: Not on file  Transportation Needs:   . Lack of Transportation (Medical): Not on file  . Lack of Transportation (Non-Medical): Not on file  Physical Activity:   . Days of Exercise per Week: Not on file  . Minutes of Exercise per Session: Not on file  Stress:   . Feeling of Stress : Not on file  Social Connections:   . Frequency of Communication with Friends and Family: Not on file  . Frequency of Social Gatherings with Friends and Family: Not on file  . Attends Religious Services: Not on file  .  Active Member of Clubs or Organizations: Not on file  . Attends Archivist Meetings: Not on file  . Marital Status: Not on file     Family History: The patient's family history includes Heart disease in her paternal grandfather; Thyroid disease in her father.  ROS:   Please see the history of present illness.     All other systems reviewed and are negative.  EKGs/Labs/Other Studies Reviewed:    The following studies were reviewed today:   EKG:  EKG is ordered today.  Shows normal sinus rhythm, left axis deviation.  Recent Labs: 02/07/2020: BUN 13; Creatinine, Ser 0.66; Hemoglobin 14.4; Platelets 467; Potassium 4.4; Sodium 136  Recent Lipid Panel    Component Value Date/Time   CHOL 259 (H) 02/07/2020 0857   TRIG 165 (H) 02/07/2020 0857   HDL 61 02/07/2020 0857   CHOLHDL 4.2 02/07/2020 0857   VLDL 33 02/07/2020 0857   LDLCALC 165 (H) 02/07/2020 0857    Physical Exam:    VS:  BP 120/80 (BP Location: Left Arm, Patient Position: Sitting, Cuff Size: Normal)   Pulse 80   Ht 5' 1.5" (1.562 m)   Wt 157 lb 3.2 oz (71.3 kg)   SpO2 99%   BMI 29.22 kg/m     Wt Readings from Last 3 Encounters:  03/06/20 157 lb 3.2 oz (71.3 kg)  02/11/20 165 lb (74.8 kg)  02/04/20 170 lb (77.1 kg)     GEN:  Well nourished, well developed in no acute distress HEENT: Normal NECK: No JVD; No carotid bruits LYMPHATICS: No lymphadenopathy CARDIAC: RRR, no murmurs, rubs, gallops RESPIRATORY:  Clear to auscultation without rales, wheezing or rhonchi  ABDOMEN: Soft, non-tender, non-distended MUSCULOSKELETAL:  No edema; No deformity  SKIN: Warm and dry NEUROLOGIC:  Alert and oriented x 3 PSYCHIATRIC:  Normal affect   ASSESSMENT:    1. NICM (nonischemic cardiomyopathy) ([place])   2. Essential hypertension   3. Pure hypercholesterolemia    PLAN:    In order of problems listed above:    1. Nonischemic cardiomyopathy, left heart cath on 01/2020 with no evidence for coronary artery  disease.  Echo 02/01/2020 with moderately reduced ejection fraction, EF 35-40%.  Patient started on Coreg 6.25 twice daily.  Increase Entresto to 49/51 mg twice daily.  Get CMR to evaluate infiltrative disease.  Refer patient to heart failure clinic at [facility] for additional input. 2. Hx of hypertension, BP controlled.  Continue Coreg and Entresto as both. 3. Patient with history of hyperlipidemia.  10-year ASCVD 1.8%.  Elevated total and LDL.  Low-cholesterol diet and exercise advised.  Follow-up in 4 weeks for Entresto titration.  Total encounter time 40 minutes  Greater  than 50% was spent in counseling and coordination of care with the patient   This note was generated in part or whole with voice recognition software. Voice recognition is usually quite accurate but there are transcription errors that can and very often do occur. I apologize for any typographical errors that were not detected and corrected.  Medication Adjustments/Labs and Tests Ordered: Current medicines are reviewed at length with the patient today.  Concerns regarding medicines are outlined above.  Orders Placed This Encounter  Procedures  . MR Card Morphology Wo/W Cm  . Basic metabolic panel  . AMB referral to CHF clinic  . EKG 12-Lead   Meds ordered this encounter  Medications  . sacubitril-valsartan (ENTRESTO) 49-51 MG    Sig: Take 1 tablet by mouth 2 (two) times daily.    Dispense:  60 tablet    Refill:  5    Patient Instructions  Medication Instructions:  Your physician has recommended you make the following change in your medication:   INCREASE your sacubitril-valsartan (ENTRESTO) to 49-51 MG: Take 1 tablet by mouth 2 (two) times daily.  *If you need a refill on your cardiac medications before your next appointment, please call your pharmacy*   Lab Work: BMP to be drawn today. If you have labs (blood work) drawn today and your tests are completely normal, you will receive your results only  by: Marland Kitchen MyChart Message (if you have MyChart) OR . A paper copy in the mail If you have any lab test that is abnormal or we need to change your treatment, we will call you to review the results.   Testing/Procedures:  Your physician has requested that you have a cardiac MRI. Cardiac MRI uses a computer to create images of your heart as its beating, producing both still and moving pictures of your heart and major blood vessels.    Follow-Up: At [facility], you and your health needs are our priority.  As part of our continuing mission to provide you with exceptional heart care, we have created designated Provider Care Teams.  These Care Teams include your primary Cardiologist (physician) and Advanced Practice Providers (APPs -  Physician Assistants and Nurse Practitioners) who all work together to provide you with the care you need, when you need it.  We recommend signing up for the patient portal called "MyChart".  Sign up information is provided on this After Visit Summary.  MyChart is used to connect with patients for Virtual Visits (Telemedicine).  Patients are able to view lab/test results, encounter notes, upcoming appointments, etc.  Non-urgent messages can be sent to your provider as well.   To learn more about what you can do with MyChart, go to [email or web address].    Your next appointment:   4 week(s)  The format for your next appointment:   In Person  Provider:   Kate Sable, MD   Other Instructions      Signed, Kate Sable, MD  03/06/2020 12:37 PM    [place]

## 2020-03-06 NOTE — Patient Instructions (Signed)
Medication Instructions:  Your physician has recommended you make the following change in your medication:   INCREASE your sacubitril-valsartan (ENTRESTO) to 49-51 MG: Take 1 tablet by mouth 2 (two) times daily.  *If you need a refill on your cardiac medications before your next appointment, please call your pharmacy*   Lab Work: BMP to be drawn today. If you have labs (blood work) drawn today and your tests are completely normal, you will receive your results only by: Marland Kitchen MyChart Message (if you have MyChart) OR . A paper copy in the mail If you have any lab test that is abnormal or we need to change your treatment, we will call you to review the results.   Testing/Procedures:  Your physician has requested that you have a cardiac MRI. Cardiac MRI uses a computer to create images of your heart as its beating, producing both still and moving pictures of your heart and major blood vessels.    Follow-Up: At [facility], you and your health needs are our priority.  As part of our continuing mission to provide you with exceptional heart care, we have created designated Provider Care Teams.  These Care Teams include your primary Cardiologist (physician) and Advanced Practice Providers (APPs -  Physician Assistants and Nurse Practitioners) who all work together to provide you with the care you need, when you need it.  We recommend signing up for the patient portal called "MyChart".  Sign up information is provided on this After Visit Summary.  MyChart is used to connect with patients for Virtual Visits (Telemedicine).  Patients are able to view lab/test results, encounter notes, upcoming appointments, etc.  Non-urgent messages can be sent to your provider as well.   To learn more about what you can do with MyChart, go to [email or web address].    Your next appointment:   4 week(s)  The format for your next appointment:   In Person  Provider:   Kate Sable, MD   Other  Instructions

## 2020-03-07 LAB — BASIC METABOLIC PANEL
BUN/Creatinine Ratio: 19 (ref 9–23)
BUN: 13 mg/dL (ref 6–24)
CO2: 22 mmol/L (ref 20–29)
Calcium: 9.2 mg/dL (ref 8.7–10.2)
Chloride: 104 mmol/L (ref 96–106)
Creatinine, Ser: 0.69 mg/dL (ref 0.57–1.00)
GFR calc Af Amer: 117 mL/min/{1.73_m2} (ref >59)
GFR calc non Af Amer: 102 mL/min/{1.73_m2} (ref >59)
Glucose: 88 mg/dL (ref 65–99)
Potassium: 4.4 mmol/L (ref 3.5–5.2)
Sodium: 138 mmol/L (ref 134–144)

## 2020-03-10 ENCOUNTER — Encounter: Payer: Self-pay | Admitting: Cardiology

## 2020-03-10 NOTE — Telephone Encounter (Signed)
Spoke with patient regarding appointment for Cardiac MRI scheduled Wednesday 03/26/20 at 9:00 am at [facility]---arrival time is 8:30am -1st floor admissions office.  Will mail information to patient and it is also in My Chart.  Patient voiced her understanding.s

## 2020-03-10 NOTE — Telephone Encounter (Signed)
Called to discuss appointment for Cardiac MRI scheduled Wednesday 03/26/20 at 9:00 am at [facility]--arrival time is 8:30 am--1st floor admissions office .  VOice mail is full---will try again later

## 2020-03-22 DIAGNOSIS — I509 Heart failure, unspecified: Secondary | ICD-10-CM | POA: Insufficient documentation

## 2020-03-25 ENCOUNTER — Telehealth (HOSPITAL_COMMUNITY): Payer: Self-pay | Admitting: Emergency Medicine

## 2020-03-25 NOTE — Telephone Encounter (Signed)
Reaching out to patient to offer assistance regarding upcoming cardiac imaging study; pt verbalizes understanding of appt date/time, parking situation and where to check in, and verified current allergies; name and call back number provided for further questions should they arise Leslie Bond RN [facility] [phone] office [phone] cell  Pt denies implants, denies claustro

## 2020-03-26 ENCOUNTER — Ambulatory Visit (HOSPITAL_COMMUNITY)
Admission: RE | Admit: 2020-03-26 | Discharge: 2020-03-26 | Disposition: A | Discharge status: Home / Self Care | Payer: 59 | Source: Ambulatory Visit | Attending: Cardiology | Admitting: Cardiology

## 2020-03-26 ENCOUNTER — Other Ambulatory Visit: Payer: Self-pay

## 2020-03-26 DIAGNOSIS — I428 Other cardiomyopathies: Secondary | ICD-10-CM

## 2020-03-26 MED ORDER — GADOBENATE DIMEGLUMINE 529 MG/ML IV SOLN
7.0000 mL | Freq: Once | INTRAVENOUS | Status: AC | PRN
  Administered 2020-03-26: 7 mL via INTRAVENOUS

## 2020-04-03 ENCOUNTER — Ambulatory Visit (INDEPENDENT_AMBULATORY_CARE_PROVIDER_SITE_OTHER): Payer: 59 | Admitting: Cardiology

## 2020-04-03 ENCOUNTER — Other Ambulatory Visit: Payer: Self-pay

## 2020-04-03 ENCOUNTER — Encounter: Payer: Self-pay | Admitting: Cardiology

## 2020-04-03 VITALS — BP 100/60 | HR 70 | Ht 61.5 in | Wt 150.1 lb

## 2020-04-03 DIAGNOSIS — I10 Essential (primary) hypertension: Secondary | ICD-10-CM | POA: Diagnosis not present

## 2020-04-03 DIAGNOSIS — I428 Other cardiomyopathies: Secondary | ICD-10-CM | POA: Diagnosis not present

## 2020-04-03 DIAGNOSIS — E7800 Pure hypercholesterolemia, unspecified: Secondary | ICD-10-CM

## 2020-04-03 NOTE — Progress Notes (Signed)
Cardiology Office Note:    Date:  04/03/2020   ID:  Leslie Esparza, DOB 10/09/1970, MRN [id number]  PCP:  Cyndi Bender, PA-C  [facility] Cardiologist:  Kate Sable, MD  [place] Electrophysiologist:  None   Referring MD: Cyndi Bender, PA-C   Chief Complaint  Patient presents with  . other    4 wk f/u no complaints today. Meds reviewed verbally with pt.     History of Present Illness:    Leslie Esparza is a 49 y.o. female with a hx of migraine, hypertension, hyperlipidemia, nonischemic cardiomyopathy last EF 35 to 40% who presents for follow-up.  She is being seen for nonischemic cardiomyopathy.  Cardiac MRI was ordered to evaluate any infiltrative cause for cardiomyopathy.  She presents for results and follow-up.  Denies any chest pain or shortness of breath.  Denies edema.  Compliant with Coreg and Entresto.  Has no concerns at this time.  Prior notes  She underwent left heart cath on 02/11/2020 with no evidence of CAD.  Echo on 02/01/2020 showed moderately reduced EF, 35 to 40%.    Past Medical History:  Diagnosis Date  . Allergic rhinitis   . Common migraine   . Mitral valve prolapse   . OCD (obsessive compulsive disorder)   . Osteoarthritis of thumb   . Recurrent cold sores     Past Surgical History:  Procedure Laterality Date  . CESAREAN SECTION    . LEFT HEART CATH AND CORONARY ANGIOGRAPHY Left 02/11/2020   Procedure: LEFT HEART CATH AND CORONARY ANGIOGRAPHY;  Surgeon: Wellington Hampshire, MD;  Location: [facility];  Service: Cardiovascular;  Laterality: Left;  . WISDOM TOOTH EXTRACTION      Current Medications: Current Meds  Medication Sig  . carvedilol (COREG) 6.25 MG tablet Take 1 tablet (6.25 mg total) by mouth 2 (two) times daily.  . Cholecalciferol (VITAMIN D) 125 MCG (5000 UT) CAPS Take 5,000 Units by mouth daily.  . famotidine (PEPCID) 40 MG tablet Take 40 mg by mouth 2 (two) times daily as needed for heartburn or  indigestion.  Marland Kitchen FLUoxetine (PROZAC) 40 MG capsule Take 80 mg by mouth daily.  Marland Kitchen loratadine (CLARITIN) 10 MG tablet Take 10 mg by mouth daily as needed for allergies.  Marland Kitchen NALTREXONE HCL PO Take 4.5 mg by mouth daily as needed (lower back pain).   . sacubitril-valsartan (ENTRESTO) 49-51 MG Take 1 tablet by mouth 2 (two) times daily.  . SUMAtriptan (IMITREX) 100 MG tablet Take 100 mg by mouth every 2 (two) hours as needed for migraine or headache. May repeat in 2 hours if headache persists or recurs.     Allergies:   Penicillins   Social History   Socioeconomic History  . Marital status: Married    Spouse name: Not on file  . Number of children: Not on file  . Years of education: Not on file  . Highest education level: Not on file  Occupational History  . Not on file  Tobacco Use  . Smoking status: Never Smoker  . Smokeless tobacco: Never Used  Vaping Use  . Vaping Use: Never used  Substance and Sexual Activity  . Alcohol use: No    Alcohol/week: 0.0 standard drinks  . Drug use: No  . Sexual activity: Not on file  Other Topics Concern  . Not on file  Social History Narrative  . Not on file   Social Determinants of Health   Financial Resource Strain:   .  Difficulty of Paying Living Expenses: Not on file  Food Insecurity:   . Worried About Charity fundraiser in the Last Year: Not on file  . Ran Out of Food in the Last Year: Not on file  Transportation Needs:   . Lack of Transportation (Medical): Not on file  . Lack of Transportation (Non-Medical): Not on file  Physical Activity:   . Days of Exercise per Week: Not on file  . Minutes of Exercise per Session: Not on file  Stress:   . Feeling of Stress : Not on file  Social Connections:   . Frequency of Communication with Friends and Family: Not on file  . Frequency of Social Gatherings with Friends and Family: Not on file  . Attends Religious Services: Not on file  . Active Member of Clubs or Organizations: Not on file   . Attends Archivist Meetings: Not on file  . Marital Status: Not on file     Family History: The patient's family history includes Heart disease in her paternal grandfather; Thyroid disease in her father.  ROS:   Please see the history of present illness.     All other systems reviewed and are negative.  EKGs/Labs/Other Studies Reviewed:    The following studies were reviewed today:   EKG:  EKG is ordered today.  Shows normal sinus rhythm  Recent Labs: 02/07/2020: Hemoglobin 14.4; Platelets 467 03/06/2020: BUN 13; Creatinine, Ser 0.69; Potassium 4.4; Sodium 138  Recent Lipid Panel    Component Value Date/Time   CHOL 259 (H) 02/07/2020 0857   TRIG 165 (H) 02/07/2020 0857   HDL 61 02/07/2020 0857   CHOLHDL 4.2 02/07/2020 0857   VLDL 33 02/07/2020 0857   LDLCALC 165 (H) 02/07/2020 0857    Physical Exam:    VS:  BP 100/60 (BP Location: Left Arm, Patient Position: Sitting, Cuff Size: Normal)   Pulse 70   Ht 5' 1.5" (1.562 m)   Wt 150 lb 2 oz (68.1 kg)   SpO2 98%   BMI 27.91 kg/m     Wt Readings from Last 3 Encounters:  04/03/20 150 lb 2 oz (68.1 kg)  03/06/20 157 lb 3.2 oz (71.3 kg)  02/11/20 165 lb (74.8 kg)     GEN:  Well nourished, well developed in no acute distress HEENT: Normal NECK: No JVD; No carotid bruits LYMPHATICS: No lymphadenopathy CARDIAC: RRR, no murmurs, rubs, gallops RESPIRATORY:  Clear to auscultation without rales, wheezing or rhonchi  ABDOMEN: Soft, non-tender, non-distended MUSCULOSKELETAL:  No edema; No deformity  SKIN: Warm and dry NEUROLOGIC:  Alert and oriented x 3 PSYCHIATRIC:  Normal affect   ASSESSMENT:    1. Essential hypertension   2. NICM (nonischemic cardiomyopathy) ([place])   3. Pure hypercholesterolemia    PLAN:    In order of problems listed above:    1. Nonischemic cardiomyopathy,lhc 01/2020 no CAD.  Echo 02/01/2020, EF 35-40%. Cardiac MRI 03/2020 showed EF has normalized with normal systolic function, LVEF  88%.  No evidence for scar or infiltrative disease.  Patient made aware of results.  Continue Coreg and Entresto.  Patient is euvolemic.  Get echocardiogram in 6 months. 2. Hx of hypertension, BP controlled.  Continue Coreg and Entresto 3. Patient with history of hyperlipidemia.  10-year ASCVD 1.8%.  Not in statin benefit group.  Low-cholesterol diet.  Follow-up in 6 months after echo.  Total encounter time 40 minutes  Greater than 50% was spent in counseling and coordination of care with the  patient   This note was generated in part or whole with voice recognition software. Voice recognition is usually quite accurate but there are transcription errors that can and very often do occur. I apologize for any typographical errors that were not detected and corrected.  Medication Adjustments/Labs and Tests Ordered: Current medicines are reviewed at length with the patient today.  Concerns regarding medicines are outlined above.  Orders Placed This Encounter  Procedures  . EKG 12-Lead  . ECHOCARDIOGRAM COMPLETE   No orders of the defined types were placed in this encounter.   Patient Instructions  Medication Instructions:  Your physician recommends that you continue on your current medications as directed. Please refer to the Current Medication list given to you today.  *If you need a refill on your cardiac medications before your next appointment, please call your pharmacy*   Lab Work: None ordered If you have labs (blood work) drawn today and your tests are completely normal, you will receive your results only by: Marland Kitchen MyChart Message (if you have MyChart) OR . A paper copy in the mail If you have any lab test that is abnormal or we need to change your treatment, we will call you to review the results.   Testing/Procedures: Your physician has requested that you have an echocardiogram. Echocardiography is a painless test that uses sound waves to create images of your heart. It provides  your doctor with information about the size and shape of your heart and how well your heart's chambers and valves are working. This procedure takes approximately one hour. There are no restrictions for this procedure. (To be scheduled in 6 months)    Follow-Up: At [facility], you and your health needs are our priority.  As part of our continuing mission to provide you with exceptional heart care, we have created designated Provider Care Teams.  These Care Teams include your primary Cardiologist (physician) and Advanced Practice Providers (APPs -  Physician Assistants and Nurse Practitioners) who all work together to provide you with the care you need, when you need it.  We recommend signing up for the patient portal called "MyChart".  Sign up information is provided on this After Visit Summary.  MyChart is used to connect with patients for Virtual Visits (Telemedicine).  Patients are able to view lab/test results, encounter notes, upcoming appointments, etc.  Non-urgent messages can be sent to your provider as well.   To learn more about what you can do with MyChart, go to [email or web address].    Your next appointment:   Your physician wants you to follow-up in: 6 months You will receive a reminder letter in the mail two months in advance. If you don't receive a letter, please call our office to schedule the follow-up appointment.   The format for your next appointment:   In Person  Provider:   You may see Kate Sable, MD or one of the following Advanced Practice Providers on your designated Care Team:    Murray Hodgkins, NP  Christell Faith, PA-C  Marrianne Mood, PA-C  Cadence [place], [place]    Other Instructions N/A     Signed, Kate Sable, MD  04/03/2020 12:56 PM    [place]

## 2020-04-03 NOTE — Patient Instructions (Signed)
Medication Instructions:  Your physician recommends that you continue on your current medications as directed. Please refer to the Current Medication list given to you today.  *If you need a refill on your cardiac medications before your next appointment, please call your pharmacy*   Lab Work: None ordered If you have labs (blood work) drawn today and your tests are completely normal, you will receive your results only by: Marland Kitchen MyChart Message (if you have MyChart) OR . A paper copy in the mail If you have any lab test that is abnormal or we need to change your treatment, we will call you to review the results.   Testing/Procedures: Your physician has requested that you have an echocardiogram. Echocardiography is a painless test that uses sound waves to create images of your heart. It provides your doctor with information about the size and shape of your heart and how well your heart's chambers and valves are working. This procedure takes approximately one hour. There are no restrictions for this procedure. (To be scheduled in 6 months)    Follow-Up: At [facility], you and your health needs are our priority.  As part of our continuing mission to provide you with exceptional heart care, we have created designated Provider Care Teams.  These Care Teams include your primary Cardiologist (physician) and Advanced Practice Providers (APPs -  Physician Assistants and Nurse Practitioners) who all work together to provide you with the care you need, when you need it.  We recommend signing up for the patient portal called "MyChart".  Sign up information is provided on this After Visit Summary.  MyChart is used to connect with patients for Virtual Visits (Telemedicine).  Patients are able to view lab/test results, encounter notes, upcoming appointments, etc.  Non-urgent messages can be sent to your provider as well.   To learn more about what you can do with MyChart, go to [email or web address].     Your next appointment:   Your physician wants you to follow-up in: 6 months You will receive a reminder letter in the mail two months in advance. If you don't receive a letter, please call our office to schedule the follow-up appointment.   The format for your next appointment:   In Person  Provider:   You may see Kate Sable, MD or one of the following Advanced Practice Providers on your designated Care Team:    Murray Hodgkins, NP  Christell Faith, PA-C  Marrianne Mood, PA-C  Cadence Kathlen Mody, [place]    Other Instructions N/A

## 2020-04-23 ENCOUNTER — Other Ambulatory Visit: Payer: Self-pay

## 2020-04-23 ENCOUNTER — Emergency Department: Payer: 59

## 2020-04-23 ENCOUNTER — Encounter: Payer: Self-pay | Admitting: Emergency Medicine

## 2020-04-23 ENCOUNTER — Observation Stay: Payer: 59

## 2020-04-23 ENCOUNTER — Observation Stay
Admission: EM | Admit: 2020-04-23 | Discharge: 2020-04-25 | Disposition: A | Discharge status: Home / Self Care | Payer: 59 | Attending: Student | Admitting: Student

## 2020-04-23 DIAGNOSIS — I110 Hypertensive heart disease with heart failure: Secondary | ICD-10-CM | POA: Diagnosis not present

## 2020-04-23 DIAGNOSIS — I10 Essential (primary) hypertension: Secondary | ICD-10-CM | POA: Diagnosis not present

## 2020-04-23 DIAGNOSIS — I639 Cerebral infarction, unspecified: Secondary | ICD-10-CM | POA: Diagnosis not present

## 2020-04-23 DIAGNOSIS — R410 Disorientation, unspecified: Secondary | ICD-10-CM | POA: Diagnosis present

## 2020-04-23 DIAGNOSIS — I5042 Chronic combined systolic (congestive) and diastolic (congestive) heart failure: Secondary | ICD-10-CM | POA: Insufficient documentation

## 2020-04-23 DIAGNOSIS — Z20822 Contact with and (suspected) exposure to covid-19: Secondary | ICD-10-CM | POA: Insufficient documentation

## 2020-04-23 DIAGNOSIS — I5020 Unspecified systolic (congestive) heart failure: Secondary | ICD-10-CM | POA: Diagnosis present

## 2020-04-23 DIAGNOSIS — Z8669 Personal history of other diseases of the nervous system and sense organs: Secondary | ICD-10-CM

## 2020-04-23 DIAGNOSIS — Z79899 Other long term (current) drug therapy: Secondary | ICD-10-CM | POA: Insufficient documentation

## 2020-04-23 DIAGNOSIS — F32A Depression, unspecified: Secondary | ICD-10-CM | POA: Diagnosis present

## 2020-04-23 HISTORY — DX: Heart failure, unspecified: I50.9

## 2020-04-23 HISTORY — DX: Essential (primary) hypertension: I10

## 2020-04-23 LAB — COOXEMETRY PANEL
Carboxyhemoglobin: 1.5 % (ref 0.5–1.5)
Methemoglobin: 0.4 % (ref 0.0–1.5)
O2 Saturation: 65.8 %
Total oxygen content: 60.4 mL/dL

## 2020-04-23 LAB — URINALYSIS, COMPLETE (UACMP) WITH MICROSCOPIC
Bilirubin Urine: NEGATIVE
Glucose, UA: NEGATIVE mg/dL
Ketones, ur: NEGATIVE mg/dL
Leukocytes,Ua: NEGATIVE
Nitrite: NEGATIVE
Protein, ur: NEGATIVE mg/dL
Specific Gravity, Urine: 1.003 — ABNORMAL LOW (ref 1.005–1.030)
pH: 8 (ref 5.0–8.0)

## 2020-04-23 LAB — COMPREHENSIVE METABOLIC PANEL
ALT: 69 U/L — ABNORMAL HIGH (ref 0–44)
AST: 36 U/L (ref 15–41)
Albumin: 4.1 g/dL (ref 3.5–5.0)
Alkaline Phosphatase: 47 U/L (ref 38–126)
Anion gap: 10 (ref 5–15)
BUN: 17 mg/dL (ref 6–20)
CO2: 24 mmol/L (ref 22–32)
Calcium: 9.5 mg/dL (ref 8.9–10.3)
Chloride: 105 mmol/L (ref 98–111)
Creatinine, Ser: 0.55 mg/dL (ref 0.44–1.00)
GFR, Estimated: >60 mL/min (ref >60)
Glucose, Bld: 103 mg/dL — ABNORMAL HIGH (ref 70–99)
Potassium: 4 mmol/L (ref 3.5–5.1)
Sodium: 139 mmol/L (ref 135–145)
Total Bilirubin: 0.7 mg/dL (ref 0.3–1.2)
Total Protein: 7 g/dL (ref 6.5–8.1)

## 2020-04-23 LAB — RESPIRATORY PANEL BY RT PCR (FLU A&B, COVID)
Influenza A by PCR: NEGATIVE
Influenza B by PCR: NEGATIVE
SARS Coronavirus 2 by RT PCR: NEGATIVE

## 2020-04-23 LAB — TSH: TSH: 1.471 u[IU]/mL (ref 0.350–4.500)

## 2020-04-23 LAB — TROPONIN I (HIGH SENSITIVITY): Troponin I (High Sensitivity): <2 ng/L (ref <18)

## 2020-04-23 LAB — AMMONIA: Ammonia: 10 umol/L (ref 9–35)

## 2020-04-23 LAB — CBC
HCT: 39.5 % (ref 36.0–46.0)
Hemoglobin: 13.3 g/dL (ref 12.0–15.0)
MCH: 29.2 pg (ref 26.0–34.0)
MCHC: 33.7 g/dL (ref 30.0–36.0)
MCV: 86.8 fL (ref 80.0–100.0)
Platelets: 350 10*3/uL (ref 150–400)
RBC: 4.55 MIL/uL (ref 3.87–5.11)
RDW: 13.2 % (ref 11.5–15.5)
WBC: 6.6 10*3/uL (ref 4.0–10.5)
nRBC: 0 % (ref 0.0–0.2)

## 2020-04-23 LAB — HIV ANTIBODY (ROUTINE TESTING W REFLEX): HIV Screen 4th Generation wRfx: NONREACTIVE

## 2020-04-23 MED ORDER — SODIUM CHLORIDE 0.9 % IV SOLN: INTRAVENOUS | Status: DC

## 2020-04-23 MED ORDER — STROKE: EARLY STAGES OF RECOVERY BOOK: Freq: Once | Status: DC

## 2020-04-23 MED ORDER — CLOPIDOGREL BISULFATE 75 MG PO TABS
300.0000 mg | ORAL_TABLET | Freq: Once | ORAL | Status: DC
  Filled 2020-04-23: qty 4

## 2020-04-23 MED ORDER — ASPIRIN 325 MG PO TABS
325.0000 mg | ORAL_TABLET | Freq: Every day | ORAL | Status: DC
  Administered 2020-04-23 – 2020-04-24 (×2): 325 mg via ORAL
  Filled 2020-04-23 (×2): qty 1

## 2020-04-23 MED ORDER — ACETAMINOPHEN 325 MG PO TABS: 650.0000 mg | ORAL_TABLET | ORAL | Status: DC | PRN

## 2020-04-23 MED ORDER — ASPIRIN 300 MG RE SUPP: 300.0000 mg | Freq: Every day | RECTAL | Status: DC

## 2020-04-23 MED ORDER — ACETAMINOPHEN 160 MG/5ML PO SOLN
650.0000 mg | ORAL | Status: DC | PRN
  Filled 2020-04-23: qty 20.3

## 2020-04-23 MED ORDER — SUMATRIPTAN SUCCINATE 50 MG PO TABS
100.0000 mg | ORAL_TABLET | ORAL | Status: DC | PRN
  Filled 2020-04-23: qty 2

## 2020-04-23 MED ORDER — VITAMIN D 25 MCG (1000 UNIT) PO TABS
5000.0000 [IU] | ORAL_TABLET | Freq: Every day | ORAL | Status: DC
  Administered 2020-04-23 – 2020-04-24 (×2): 5000 [IU] via ORAL
  Filled 2020-04-23 (×3): qty 5

## 2020-04-23 MED ORDER — IOHEXOL 350 MG/ML SOLN
75.0000 mL | Freq: Once | INTRAVENOUS | Status: AC | PRN
  Administered 2020-04-23: 75 mL via INTRAVENOUS

## 2020-04-23 MED ORDER — ATORVASTATIN CALCIUM 80 MG PO TABS
80.0000 mg | ORAL_TABLET | Freq: Every day | ORAL | Status: DC
  Administered 2020-04-23 – 2020-04-24 (×2): 80 mg via ORAL
  Filled 2020-04-23: qty 1
  Filled 2020-04-23: qty 4
  Filled 2020-04-23: qty 1

## 2020-04-23 MED ORDER — FLUOXETINE HCL 20 MG PO CAPS
80.0000 mg | ORAL_CAPSULE | Freq: Every day | ORAL | Status: DC
  Administered 2020-04-24: 80 mg via ORAL
  Filled 2020-04-23 (×2): qty 4

## 2020-04-23 MED ORDER — LORATADINE 10 MG PO TABS: 10.0000 mg | ORAL_TABLET | Freq: Every day | ORAL | Status: DC | PRN

## 2020-04-23 MED ORDER — ACETAMINOPHEN 650 MG RE SUPP: 650.0000 mg | RECTAL | Status: DC | PRN

## 2020-04-23 MED ORDER — CLOPIDOGREL BISULFATE 75 MG PO TABS
75.0000 mg | ORAL_TABLET | Freq: Every day | ORAL | Status: DC
  Administered 2020-04-24: 75 mg via ORAL
  Filled 2020-04-23 (×2): qty 1

## 2020-04-23 MED ORDER — FAMOTIDINE 20 MG PO TABS: 40.0000 mg | ORAL_TABLET | Freq: Two times a day (BID) | ORAL | Status: DC | PRN

## 2020-04-23 MED ORDER — SODIUM CHLORIDE 0.9 % IV BOLUS
1000.0000 mL | Freq: Once | INTRAVENOUS | Status: AC
  Administered 2020-04-23: 1000 mL via INTRAVENOUS

## 2020-04-23 NOTE — ED Notes (Signed)
Patient transported to CT 

## 2020-04-23 NOTE — H&P (Addendum)
History and Physical    Leslie Esparza VVO:[id number] DOB: 05-12-1971 DOA: 04/23/2020  PCP: Cyndi Bender, PA-C   Patient coming from: Home  I have personally briefly reviewed patient's old medical records in [place]  Chief Complaint: "I don't feel right"  HPI: Leslie Esparza is a 49 y.o. female with medical history significant for chronic systolic heart failure, hypertension, migraine headaches who was brought in by her husband for evaluation of "not feeling right". Patient's husband states that she woke up at 7.30 am and by 7.45am he noted that she had difficulty finding her words. He states that she di not have slurred speech and so he decided to observe her. Patient remained sluggish throughout the course of the day which was concerning to him. He was worried that she may be having a side effect from recently introduced medication and so decided to bring her to the ER for evaluation. She has no lower extremity weakness/numbness, no difficulty swallowing, no blurred vision, no headache, no nausea, no vomiting, no chest pain, no abdominal pain, no changes in her bowel habits. She denies having a family history of CVA. Labs reviewed and are within normal limits CT scan of the head shows foci of ethmoid sinus disease noted.  Study otherwise unremarkable. CTA of the head and neck is negative for  any atherosclerotic disease. No large or medium vessel occlusion. Enlarged and heterogeneous thyroid gland with multiple nodules, unchanged since January 2019 and before. Largest nodule on the right measures 1.7 cm. Nodules previously evaluated with ultrasound and biopsy. MRI of the brain shows a ossible small cortical infarct in the high posterior left frontal cortex. No associated edema or mass effect. Focal T2/FLAIR hyperintensity in the posterior left frontal subcortical white matter is compatible with gliosis surrounding a developmental venous anomaly given characteristic  prominent draining vein on SWI in this region. Pansinus mucosal thickening without air-fluid levels. 12 Lead EKG shows sinus rhythm with minimal ST depression in the lateral leads      ED Course: Patient is a 49 year old female who was brought in by family for evaluation of word finding difficulties and lethargy. She has an abnormal MRI which may be suggestive of a small cortical infarct in the high posterior left cortex. She will be admitted to the hospital for further evaluation.   Review of Systems: As per HPI otherwise 10 point review of systems negative.    Past Medical History:  Diagnosis Date  . Allergic rhinitis   . CHF (congestive heart failure) ([place])   . Common migraine   . Hypertension   . Mitral valve prolapse   . OCD (obsessive compulsive disorder)   . Osteoarthritis of thumb   . Recurrent cold sores     Past Surgical History:  Procedure Laterality Date  . CESAREAN SECTION    . LEFT HEART CATH AND CORONARY ANGIOGRAPHY Left 02/11/2020   Procedure: LEFT HEART CATH AND CORONARY ANGIOGRAPHY;  Surgeon: Wellington Hampshire, MD;  Location: [facility];  Service: Cardiovascular;  Laterality: Left;  . WISDOM TOOTH EXTRACTION       reports that she has never smoked. She has never used smokeless tobacco. She reports that she does not drink alcohol and does not use drugs.  Allergies  Allergen Reactions  . Penicillins Rash    had reaction as a child    Family History  Problem Relation Age of Onset  . Heart disease Paternal Grandfather   . Thyroid disease Father  Prior to Admission medications   Medication Sig Start Date End Date Taking? Authorizing Provider  carvedilol (COREG) 6.25 MG tablet Take 1 tablet (6.25 mg total) by mouth 2 (two) times daily. 02/04/20   Kate Sable, MD  Cholecalciferol (VITAMIN D) 125 MCG (5000 UT) CAPS Take 5,000 Units by mouth daily.    [provider]  famotidine (PEPCID) 40 MG tablet Take 40 mg by mouth 2 (two)  times daily as needed for heartburn or indigestion.    [provider]  FLUoxetine (PROZAC) 40 MG capsule Take 80 mg by mouth daily.    [provider]  loratadine (CLARITIN) 10 MG tablet Take 10 mg by mouth daily as needed for allergies.    [provider]  NALTREXONE HCL PO Take 4.5 mg by mouth daily as needed (lower back pain).     [provider]  sacubitril-valsartan (ENTRESTO) 49-51 MG Take 1 tablet by mouth 2 (two) times daily. 03/06/20   Kate Sable, MD  SUMAtriptan (IMITREX) 100 MG tablet Take 100 mg by mouth every 2 (two) hours as needed for migraine or headache. May repeat in 2 hours if headache persists or recurs.    [provider]    Physical Exam: Vitals:   04/23/20 1330 04/23/20 1415 04/23/20 1430 04/23/20 1500  BP:    135/84  Pulse: 72 69 64 80  Resp: 10 16 10 14   Temp:      TempSrc:      SpO2: 98% 100% 100% 100%  Weight:      Height:         Vitals:   04/23/20 1330 04/23/20 1415 04/23/20 1430 04/23/20 1500  BP:    135/84  Pulse: 72 69 64 80  Resp: 10 16 10 14   Temp:      TempSrc:      SpO2: 98% 100% 100% 100%  Weight:      Height:        Constitutional: NAD, alert and oriented x 3 Eyes: PERRL, lids and conjunctivae normal ENMT: Mucous membranes are moist.  Neck: normal, supple, no masses, no thyromegaly Respiratory: clear to auscultation bilaterally, no wheezing, no crackles. Normal respiratory effort. No accessory muscle use.  Cardiovascular: Regular rate and rhythm, no murmurs / rubs / gallops. No extremity edema. 2+ pedal pulses. No carotid bruits.  Abdomen: no tenderness, no masses palpated. No hepatosplenomegaly. Bowel sounds positive.  Musculoskeletal: no clubbing / cyanosis. No joint deformity upper and lower extremities.  Skin: no rashes, lesions, ulcers.  Neurologic: No gross focal neurologic deficit.Able to move all extremities Psychiatric: Normal mood and affect.   Labs on Admission: I have  personally reviewed following labs and imaging studies  CBC: Recent Labs  Lab 04/23/20 1020  WBC 6.6  HGB 13.3  HCT 39.5  MCV 86.8  PLT 563   Basic Metabolic Panel: Recent Labs  Lab 04/23/20 1020  NA 139  K 4.0  CL 105  CO2 24  GLUCOSE 103*  BUN 17  CREATININE 0.55  CALCIUM 9.5   GFR: Estimated Creatinine Clearance: 74.3 mL/min (by C-G formula based on SCr of 0.55 mg/dL). Liver Function Tests: Recent Labs  Lab 04/23/20 1020  AST 36  ALT 69*  ALKPHOS 47  BILITOT 0.7  PROT 7.0  ALBUMIN 4.1   No results for input(s): LIPASE, AMYLASE in the last 168 hours. No results for input(s): AMMONIA in the last 168 hours. Coagulation Profile: No results for input(s): INR, PROTIME in the last 168 hours.  Cardiac Enzymes: No results for input(s): CKTOTAL, CKMB, CKMBINDEX, TROPONINI in the last 168 hours. BNP (last 3 results) No results for input(s): PROBNP in the last 8760 hours. HbA1C: No results for input(s): HGBA1C in the last 72 hours. CBG: No results for input(s): GLUCAP in the last 168 hours. Lipid Profile: No results for input(s): CHOL, HDL, LDLCALC, TRIG, CHOLHDL, LDLDIRECT in the last 72 hours. Thyroid Function Tests: No results for input(s): TSH, T4TOTAL, FREET4, T3FREE, THYROIDAB in the last 72 hours. Anemia Panel: No results for input(s): VITAMINB12, FOLATE, FERRITIN, TIBC, IRON, RETICCTPCT in the last 72 hours. Urine analysis:    Component Value Date/Time   COLORURINE STRAW (A) 04/23/2020 1020   APPEARANCEUR CLEAR (A) 04/23/2020 1020   LABSPEC 1.003 (L) 04/23/2020 1020   PHURINE 8.0 04/23/2020 1020   GLUCOSEU NEGATIVE 04/23/2020 1020   HGBUR MODERATE (A) 04/23/2020 1020   BILIRUBINUR NEGATIVE 04/23/2020 1020   KETONESUR NEGATIVE 04/23/2020 1020   PROTEINUR NEGATIVE 04/23/2020 1020   NITRITE NEGATIVE 04/23/2020 1020   LEUKOCYTESUR NEGATIVE 04/23/2020 1020    Radiological Exams on Admission: CT Angio Head W or Wo Contrast  Result Date:  04/23/2020 CLINICAL DATA:  Woke up today with speech disturbance. Tiny left parietal infarction suggested by brain MRI. EXAM: CT ANGIOGRAPHY HEAD AND NECK TECHNIQUE: Multidetector CT imaging of the head and neck was performed using the standard protocol during bolus administration of intravenous contrast. Multiplanar CT image reconstructions and MIPs were obtained to evaluate the vascular anatomy. Carotid stenosis measurements (when applicable) are obtained utilizing NASCET criteria, using the distal internal carotid diameter as the denominator. CONTRAST:  29mL OMNIPAQUE IOHEXOL 350 MG/ML SOLN COMPARISON:  Brain MRI same day. Multiple previous thyroid ultrasound studies 2018 and before. Previous neck CT 07/01/2017 FINDINGS: CTA NECK FINDINGS Aortic arch: The aortic arch does not show atherosclerotic disease. Branching pattern is normal without origin stenosis. Right carotid system: Common carotid artery widely patent to the bifurcation. Carotid bifurcation is normal without soft or calcified plaque. Cervical ICA is tortuous but widely patent. Left carotid system: Common carotid artery widely patent to the bifurcation. No soft or calcified plaque at the carotid bifurcation. No stenosis. Cervical ICA is tortuous but widely patent. Vertebral arteries: Both vertebral artery origins are widely patent. Both vertebral arteries are normal through the cervical region to the foramen magnum. Skeleton: Minimal cervical spondylosis. Other neck: Enlarged and heterogeneous thyroid gland with multiple nodules, unchanged since the study of January 2019. Largest nodule on the right measures 1.7 cm. Upper chest: Normal Review of the MIP images confirms the above findings CTA HEAD FINDINGS Anterior circulation: Both internal carotid arteries widely patent through the skull base and siphon regions. The anterior and middle cerebral vessels are normal without proximal stenosis, aneurysm or vascular malformation. Small developmental  venous anomaly in the left posterior frontal deep white matter. Posterior circulation: Both vertebral arteries widely patent to the basilar. No basilar stenosis. Posterior circulation branch vessels are normal. Venous sinuses: Patent and normal. Anatomic variants: None significant. Review of the MIP images confirms the above findings IMPRESSION: 1. Negative CT angiography of the neck and head. No atherosclerotic disease. No large or medium vessel occlusion. 2. Enlarged and heterogeneous thyroid gland with multiple nodules, unchanged since January 2019 and before. Largest nodule on the right measures 1.7 cm. Nodules previously evaluated with ultrasound and biopsy. Aortic Atherosclerosis (ICD10-I70.0). Electronically Signed   By: Nelson Chimes M.D.   On: 04/23/2020 15:43   CT Head Wo Contrast  Result Date: 04/23/2020  CLINICAL DATA:  Vertigo and dysarthria EXAM: CT HEAD WITHOUT CONTRAST TECHNIQUE: Contiguous axial images were obtained from the base of the skull through the vertex without intravenous contrast. COMPARISON:  None. FINDINGS: Brain: Ventricles and sulci are normal in size and configuration. There is no intracranial mass, hemorrhage, extra-axial fluid collection, or midline shift. The brain parenchyma appears unremarkable. No evident acute infarct. Vascular: No hyperdense vessel.  No evident vascular calcification. Skull: Bony calvarium appears intact. Sinuses/Orbits: There is mucosal thickening and opacification in several ethmoid air cells. Other visualized paranasal sinuses are clear. Visualized orbits appear symmetric bilaterally. Other: Visualized mastoid air cells are clear. IMPRESSION: Foci of ethmoid sinus disease noted.  Study otherwise unremarkable. Electronically Signed   By: Lowella Grip III M.D.   On: 04/23/2020 11:00   CT Angio Neck W and/or Wo Contrast  Result Date: 04/23/2020 CLINICAL DATA:  Woke up today with speech disturbance. Tiny left parietal infarction suggested by brain  MRI. EXAM: CT ANGIOGRAPHY HEAD AND NECK TECHNIQUE: Multidetector CT imaging of the head and neck was performed using the standard protocol during bolus administration of intravenous contrast. Multiplanar CT image reconstructions and MIPs were obtained to evaluate the vascular anatomy. Carotid stenosis measurements (when applicable) are obtained utilizing NASCET criteria, using the distal internal carotid diameter as the denominator. CONTRAST:  50mL OMNIPAQUE IOHEXOL 350 MG/ML SOLN COMPARISON:  Brain MRI same day. Multiple previous thyroid ultrasound studies 2018 and before. Previous neck CT 07/01/2017 FINDINGS: CTA NECK FINDINGS Aortic arch: The aortic arch does not show atherosclerotic disease. Branching pattern is normal without origin stenosis. Right carotid system: Common carotid artery widely patent to the bifurcation. Carotid bifurcation is normal without soft or calcified plaque. Cervical ICA is tortuous but widely patent. Left carotid system: Common carotid artery widely patent to the bifurcation. No soft or calcified plaque at the carotid bifurcation. No stenosis. Cervical ICA is tortuous but widely patent. Vertebral arteries: Both vertebral artery origins are widely patent. Both vertebral arteries are normal through the cervical region to the foramen magnum. Skeleton: Minimal cervical spondylosis. Other neck: Enlarged and heterogeneous thyroid gland with multiple nodules, unchanged since the study of January 2019. Largest nodule on the right measures 1.7 cm. Upper chest: Normal Review of the MIP images confirms the above findings CTA HEAD FINDINGS Anterior circulation: Both internal carotid arteries widely patent through the skull base and siphon regions. The anterior and middle cerebral vessels are normal without proximal stenosis, aneurysm or vascular malformation. Small developmental venous anomaly in the left posterior frontal deep white matter. Posterior circulation: Both vertebral arteries widely  patent to the basilar. No basilar stenosis. Posterior circulation branch vessels are normal. Venous sinuses: Patent and normal. Anatomic variants: None significant. Review of the MIP images confirms the above findings IMPRESSION: 1. Negative CT angiography of the neck and head. No atherosclerotic disease. No large or medium vessel occlusion. 2. Enlarged and heterogeneous thyroid gland with multiple nodules, unchanged since January 2019 and before. Largest nodule on the right measures 1.7 cm. Nodules previously evaluated with ultrasound and biopsy. Aortic Atherosclerosis (ICD10-I70.0). Electronically Signed   By: Nelson Chimes M.D.   On: 04/23/2020 15:43   MR BRAIN WO CONTRAST  Result Date: 04/23/2020 CLINICAL DATA:  Mental status change, unknown cause. EXAM: MRI HEAD WITHOUT CONTRAST TECHNIQUE: Multiplanar, multiecho pulse sequences of the brain and surrounding structures were obtained without intravenous contrast. COMPARISON:  CT head from the same day. FINDINGS: Brain: Small focus of apparent restricted diffusion in the high posterior left frontal  lobe (series 5 and 6, image 34). This area is not confirmed on coronal diffusion weighted imaging, likely because it is not covered by the slice selection. No edema or mass effect. No acute hemorrhage, hydrocephalus, extra-axial collection or mass lesion. Focal T2/FLAIR hyperintensity in the posterior left frontal subcortical white matter likely represents gliosis surrounding a developmental venous anomaly given characteristic prominent draining vein on SWI in this region (series 13, image 35). Vascular: Major arterial flow voids are maintained at the skull base. Skull and upper cervical spine: Normal marrow signal. Sinuses/Orbits: Pansinus mucosal thickening. Other: No mastoid effusions. IMPRESSION: 1. Possible small cortical infarct in the high posterior left frontal cortex (series 5 and 6, image 34). No associated edema or mass effect. 2. Focal T2/FLAIR  hyperintensity in the posterior left frontal subcortical white matter is compatible with gliosis surrounding a developmental venous anomaly given characteristic prominent draining vein on SWI in this region. 3. Pansinus mucosal thickening without air-fluid levels. Electronically Signed   By: Margaretha Sheffield MD   On: 04/23/2020 14:35    EKG: Independently reviewed. Sinus rhythm with minimal ST depression in the lateral leads  Assessment/Plan Principal Problem:   Acute CVA (cerebrovascular accident) ([place]) Active Problems:   HFrEF (heart failure with reduced ejection fraction) ([place])   Essential hypertension    Acute CVA Patient presents for evaluation of word finding difficulties and lethargy and MRI of the brain shows a  possible small cortical infarct in the high posterior left frontal cortex (series 5 and 6, image 34). No associated edema or mass effect. Will start patient on Aspirin and high intensity statins Allow for permissive hypertension Obtain 2D Echocardiogram to rule out a cardiac thrombus Will consult neurology Will request PT/OT/ST consult   History of chronic systolic heart failure Not acutely exacerbated Hold Carvedilol and Entresto for now Last known LVEF from 08/21 was 35 - 40% Maintain low sodium diet    Essential Hypertension Allow for permissive hypertension Hold Carvedilol and Entresto    Depression Continue Fluoxetine    History of Migraine headaches No headaches at this time Continue PRN Summatriptan   DVT prophylaxis: SCD  Code Status: Full code Family Communication: Greater than 50% of time was spent discussing plan of care with patient and her husband at the beside. All questions and concerns have been addressed. They verbalize understanding and agree with the plan Disposition Plan: Back to previous home environment Consults called: Neurology    Collier Bullock MD [facility]     04/23/2020, 4:45 PM

## 2020-04-23 NOTE — ED Notes (Signed)
Respiratory called and informed that the lab carboxyhgb was sent to lab.  RT verbalized understanding of information.

## 2020-04-23 NOTE — ED Provider Notes (Addendum)
[facility] Emergency Department Provider Note  Time seen: 9:57 AM  I have reviewed the triage vital signs and the nursing notes.   HISTORY  Chief Complaint Confusion  HPI Leslie Esparza is a 49 y.o. female with a past medical history of CHF, hypertension, presents to the emergency department for confusion.  According to the patient she awoke this morning around 7:30 AM, states she has been feeling somewhat confused and "foggy."  States she feels sluggish and slow today.  Denies any alcohol or sleep aids.  Denies any symptoms similar in the past.  Denies any recent fever cough or shortness of breath.  Denies nausea vomiting diarrhea or dysuria.  Largely negative review of systems.  Denies any focal weakness numbness of any arm or leg.   Past Medical History:  Diagnosis Date  . Allergic rhinitis   . CHF (congestive heart failure) ([place])   . Common migraine   . Hypertension   . Mitral valve prolapse   . OCD (obsessive compulsive disorder)   . Osteoarthritis of thumb   . Recurrent cold sores     Patient Active Problem List   Diagnosis Date Noted  . HFrEF (heart failure with reduced ejection fraction) ([place])   . CAP (community acquired pneumonia) 04/25/2014  . Cough 04/24/2014    Past Surgical History:  Procedure Laterality Date  . CESAREAN SECTION    . LEFT HEART CATH AND CORONARY ANGIOGRAPHY Left 02/11/2020   Procedure: LEFT HEART CATH AND CORONARY ANGIOGRAPHY;  Surgeon: Wellington Hampshire, MD;  Location: [facility];  Service: Cardiovascular;  Laterality: Left;  . WISDOM TOOTH EXTRACTION      Prior to Admission medications   Medication Sig Start Date End Date Taking? Authorizing Provider  carvedilol (COREG) 6.25 MG tablet Take 1 tablet (6.25 mg total) by mouth 2 (two) times daily. 02/04/20   Kate Sable, MD  Cholecalciferol (VITAMIN D) 125 MCG (5000 UT) CAPS Take 5,000 Units by mouth daily.    [provider]  famotidine  (PEPCID) 40 MG tablet Take 40 mg by mouth 2 (two) times daily as needed for heartburn or indigestion.    [provider]  FLUoxetine (PROZAC) 40 MG capsule Take 80 mg by mouth daily.    [provider]  loratadine (CLARITIN) 10 MG tablet Take 10 mg by mouth daily as needed for allergies.    [provider]  NALTREXONE HCL PO Take 4.5 mg by mouth daily as needed (lower back pain).     [provider]  sacubitril-valsartan (ENTRESTO) 49-51 MG Take 1 tablet by mouth 2 (two) times daily. 03/06/20   Kate Sable, MD  SUMAtriptan (IMITREX) 100 MG tablet Take 100 mg by mouth every 2 (two) hours as needed for migraine or headache. May repeat in 2 hours if headache persists or recurs.    [provider]    Allergies  Allergen Reactions  . Penicillins Rash    had reaction as a child    Family History  Problem Relation Age of Onset  . Heart disease Paternal Grandfather   . Thyroid disease Father     Social History Social History   Tobacco Use  . Smoking status: Never Smoker  . Smokeless tobacco: Never Used  Vaping Use  . Vaping Use: Never used  Substance Use Topics  . Alcohol use: No    Alcohol/week: 0.0 standard drinks  . Drug use: No    Review of Systems Constitutional: Negative for fever.  Cardiovascular: Negative for chest pain. Respiratory: Negative for shortness of breath. Gastrointestinal: Negative for abdominal pain, vomiting and diarrhea. Genitourinary: Negative for urinary compaints Musculoskeletal: Negative for musculoskeletal complaints Skin: Negative for skin complaints  Neurological: Negative for headache.  Negative for weakness or numbness.  Positive for confusion. All other ROS negative  ____________________________________________   PHYSICAL EXAM:  VITAL SIGNS: ED Triage Vitals  Enc Vitals Group     BP 04/23/20 0932 138/83     Pulse Rate 04/23/20 0932 95     Resp 04/23/20 0932 16     Temp 04/23/20 0932 97.8  F (36.6 C)     Temp Source 04/23/20 0932 Oral     SpO2 04/23/20 0932 99 %     Weight 04/23/20 0933 150 lb 2.1 oz (68.1 kg)     Height 04/23/20 0933 5' 1.5" (1.562 m)     Head Circumference --      Peak Flow --      Pain Score 04/23/20 0933 0     Pain Loc --      Pain Edu? --      Excl. in [place]? --    Constitutional: Alert and oriented. Well appearing and in no distress. Eyes: Normal exam ENT      Head: Normocephalic and atraumatic.      Mouth/Throat: Mucous membranes are moist. Cardiovascular: Normal rate, regular rhythm.  Respiratory: Normal respiratory effort without tachypnea nor retractions. Breath sounds are clear Gastrointestinal: Soft and nontender. No distention.   Musculoskeletal: Nontender with normal range of motion in all extremities.  Neurologic: Patient does have somewhat slowed speech.  Normal language.  Equal grip strength bilaterally.  No pronator drift.  Normal motor in lower extremities. Skin:  Skin is warm, dry and intact.  Psychiatric: Mood and affect are normal. Speech and behavior are normal.   ____________________________________________    EKG  EKG viewed and interpreted by myself shows a sinus rhythm at 78 bpm with a slightly widened QRS, normal axis, normal intervals nonspecific ST changes.  ____________________________________________    RADIOLOGY  CT scan head is negative. MRI shows possible cortical infarct.  ____________________________________________   INITIAL IMPRESSION / ASSESSMENT AND PLAN / ED COURSE  Pertinent labs & imaging results that were available during my care of the patient were reviewed by me and considered in my medical decision making (see chart for details).   Patient presents emergency department for feeling confused/foggy this morning.  Overall the patient appears well, no distress.  Normal physical and neurological exam.  However given the patient's confusion we will check labs, CT scan head and continue to closely  monitor.  We will IV hydrate while awaiting results.  Patient agreeable to plan of care.  MRI shows possible cortical infarct. Given the patient's symptoms and MRI findings we'll admit to the hospitalist for further work-up and treatment.  NIH Stroke Scale   Interval: Baseline Time: 3:19 PM Person Administering Scale: Harvest Dark  Administer stroke scale items in the order listed. Record performance in each category after each subscale exam. Do not go back and change scores. Follow directions provided for each exam technique. Scores should reflect what the patient does, not what the clinician thinks the patient can do. The clinician should record answers while administering the exam and work quickly. Except where indicated, the patient should not be coached (i.e., repeated requests to patient to make a special effort).   1a  Level of consciousness: 0=alert; keenly responsive  1b. LOC questions:  0=Performs both tasks correctly  1c. LOC commands: 0=Performs both tasks correctly  2.  Best Gaze: 0=normal  3.  Visual: 0=No visual loss  4. Facial Palsy: 0=Normal symmetric movement  5a.  Motor left arm: 0=No drift, limb holds 90 (or 45) degrees for full 10 seconds  5b.  Motor right arm: 0=No drift, limb holds 90 (or 45) degrees for full 10 seconds  6a. motor left leg: 0=No drift, limb holds 90 (or 45) degrees for full 10 seconds  6b  Motor right leg:  0=No drift, limb holds 90 (or 45) degrees for full 10 seconds  7. Limb Ataxia: 0=Absent  8.  Sensory: 0=Normal; no sensory loss  9. Best Language:  0=No aphasia, normal  10. Dysarthria: 0=Normal  11. Extinction and Inattention: 0=No abnormality  12. Distal motor function: 0=Normal   Total:   0    Leslie Esparza was evaluated in Emergency Department on 04/23/2020 for the symptoms described in the history of present illness. She was evaluated in the context of the global COVID-19 pandemic, which necessitated consideration that the  patient might be at risk for infection with the SARS-CoV-2 virus that causes COVID-19. Institutional protocols and algorithms that pertain to the evaluation of patients at risk for COVID-19 are in a state of rapid change based on information released by regulatory bodies including the CDC and federal and state organizations. These policies and algorithms were followed during the patient's care in the ED.  ____________________________________________   FINAL CLINICAL IMPRESSION(S) / ED DIAGNOSES  Confusion CVA   Harvest Dark, MD 04/23/20 1512    Harvest Dark, MD 04/23/20 1520

## 2020-04-23 NOTE — ED Notes (Signed)
ED Provider at bedside.  ED [facility] and Admitting doc

## 2020-04-23 NOTE — ED Notes (Signed)
Family at bedside. 

## 2020-04-23 NOTE — ED Notes (Signed)
Provider at bedside

## 2020-04-23 NOTE — ED Notes (Signed)
Patient transported to MRI 

## 2020-04-23 NOTE — ED Notes (Signed)
Attempted to call report

## 2020-04-23 NOTE — ED Triage Notes (Signed)
States she woke up this morning and feels like she is having difficulty getting her words out.  Went to bed last night at [employer] 2200.  AAOx3.  Skin warm and dry. MAE equally and strong.  Facial movement equal.  NAD.  Speech clear.  VAN negative

## 2020-04-23 NOTE — ED Notes (Signed)
Leslie Esparza Husband [phone]

## 2020-04-23 NOTE — ED Notes (Signed)
Advised MRI that patient has a room assigned.

## 2020-04-23 NOTE — Consult Note (Signed)
Neurology Consultation Reason for Consult: Concern for stroke Referring Physician: Agbata, T  CC: Difficulty getting my thoughts out  History is obtained from:Patient  HPI: Leslie Esparza is a 49 y.o. female with a history of recently diagnosed heart failure who presents with difficulty with thoughts that started on awakening this morning.  She states that she just feels like she has trouble getting her words, and "something is wrong" but has difficulty expressing exactly what it is.  She denies any numbness, weakness, visual change, headache or other symptoms.   LKW: 11/9 prior to bed tpa given?: no, outside of window    ROS: A 14 point ROS was performed and is negative except as noted in the HPI.  Past Medical History:  Diagnosis Date  . Allergic rhinitis   . CHF (congestive heart failure) ([place])   . Common migraine   . Hypertension   . Mitral valve prolapse   . OCD (obsessive compulsive disorder)   . Osteoarthritis of thumb   . Recurrent cold sores      Family History  Problem Relation Age of Onset  . Heart disease Paternal Grandfather   . Thyroid disease Father      Social History:  reports that she has never smoked. She has never used smokeless tobacco. She reports that she does not drink alcohol and does not use drugs.   Exam: Current vital signs: BP 135/84   Pulse 80   Temp 97.8 F (36.6 C) (Oral)   Resp 14   Ht 5' 1.5" (1.562 m)   Wt 68.1 kg   LMP 04/13/2020   SpO2 100%   BMI 27.91 kg/m  Vital signs in last 24 hours: Temp:  [97.8 F (36.6 C)] 97.8 F (36.6 C) (11/10 0932) Pulse Rate:  [61-95] 80 (11/10 1500) Resp:  [10-16] 14 (11/10 1500) BP: (114-138)/(61-98) 135/84 (11/10 1500) SpO2:  [91 %-100 %] 100 % (11/10 1500) Weight:  [68.1 kg] 68.1 kg (11/10 0933)   Physical Exam  Constitutional: Appears well-developed and well-nourished.  Psych: Affect appropriate to situation Eyes: No scleral injection HENT: No OP obstrucion MSK: no joint  deformities.  Cardiovascular: Normal rate and regular rhythm.  Respiratory: Effort normal, non-labored breathing GI: Soft.  No distension. There is no tenderness.  Skin: WDI  Neuro: Mental Status: Patient is awake, alert, oriented to person, place, month, year, and situation. Patient is able to give a clear and coherent history. No signs of aphasia or neglect She is able to repeat, able to name simple objects.  She is able to spell world backwards, and perform serial sevens. Cranial Nerves: II: Visual Fields are full. Pupils are equal, round, and reactive to light.   III,IV, VI: EOMI without ptosis or diploplia.  V: Facial sensation is symmetric to temperature VII: I question a mild decrease in the right nasolabial fold VIII: hearing is intact to voice X: Uvula elevates symmetrically XI: Shoulder shrug is symmetric. XII: tongue is midline without atrophy or fasciculations.  Motor: Tone is normal. Bulk is normal. 5/5 strength was present in all four extremities.  Sensory: Sensation is symmetric to light touch and temperature in the arms and legs. Cerebellar: FNF intact bilaterally   I have reviewed labs in epic and the results pertinent to this consultation are: Creatinine 0.55 Sodium 139   I have reviewed the images obtained: MRI brain-punctate focus of restricted diffusion in the left parietal region  Impression: 49 year old female with cognitive slowing with question of diffusion change on MRI.  Certainly the region where this is seen could contribute to some difficulty with language, but it is very small.  I would favor repeating axial, sagittal, coronal diffusion images in that area to confirm that this is truly an area of restricted diffusion.  She does not have any headache to suggest migraine or migrainous infarct as etiology.  With her recent diagnosis of heart failure, there could be concern for cardiac source, but MRI from October showed EF had  normalized.  Recommendations: 1) repeat DWI images focusing on that area of the brain 2) dual antiplatelet therapy with aspirin and Plavix for now, will only need this from a stroke perspective for 3 weeks 3) lipids, A1c 4) repeat echo 5) neurology will continue to follow   Roland Rack, MD [facility] [phone]  If 7pm- 7am, please page neurology on call as listed in [place].

## 2020-04-24 ENCOUNTER — Telehealth: Payer: Self-pay

## 2020-04-24 ENCOUNTER — Observation Stay
Admit: 2020-04-24 | Discharge: 2020-04-24 | Disposition: A | Discharge status: Home / Self Care | Payer: 59 | Attending: Student | Admitting: Student

## 2020-04-24 ENCOUNTER — Other Ambulatory Visit: Payer: Self-pay

## 2020-04-24 DIAGNOSIS — G459 Transient cerebral ischemic attack, unspecified: Secondary | ICD-10-CM

## 2020-04-24 DIAGNOSIS — I5020 Unspecified systolic (congestive) heart failure: Secondary | ICD-10-CM | POA: Diagnosis not present

## 2020-04-24 DIAGNOSIS — Z8669 Personal history of other diseases of the nervous system and sense organs: Secondary | ICD-10-CM | POA: Diagnosis not present

## 2020-04-24 DIAGNOSIS — F32A Depression, unspecified: Secondary | ICD-10-CM

## 2020-04-24 DIAGNOSIS — I639 Cerebral infarction, unspecified: Secondary | ICD-10-CM | POA: Diagnosis not present

## 2020-04-24 DIAGNOSIS — I10 Essential (primary) hypertension: Secondary | ICD-10-CM | POA: Diagnosis not present

## 2020-04-24 DIAGNOSIS — I6389 Other cerebral infarction: Secondary | ICD-10-CM | POA: Diagnosis not present

## 2020-04-24 LAB — HEMOGLOBIN A1C
Hgb A1c MFr Bld: 5.3 % (ref 4.8–5.6)
Mean Plasma Glucose: 105.41 mg/dL

## 2020-04-24 LAB — LIPID PANEL
Cholesterol: 155 mg/dL (ref 0–200)
HDL: 43 mg/dL (ref >40)
LDL Cholesterol: 96 mg/dL (ref 0–99)
Total CHOL/HDL Ratio: 3.6 RATIO
Triglycerides: 78 mg/dL (ref <150)
VLDL: 16 mg/dL (ref 0–40)

## 2020-04-24 MED ORDER — CARVEDILOL 6.25 MG PO TABS
6.2500 mg | ORAL_TABLET | Freq: Two times a day (BID) | ORAL | Status: DC
  Administered 2020-04-24: 6.25 mg via ORAL
  Filled 2020-04-24 (×3): qty 1

## 2020-04-24 MED ORDER — ATORVASTATIN CALCIUM 80 MG PO TABS
80.0000 mg | ORAL_TABLET | Freq: Every day | ORAL | 1 refills | Status: DC
Start: 2020-04-25 — End: 2020-04-24

## 2020-04-24 MED ORDER — ATORVASTATIN CALCIUM 40 MG PO TABS
40.0000 mg | ORAL_TABLET | Freq: Every day | ORAL | 1 refills | Status: DC
Start: 2020-04-25 — End: 2020-07-21

## 2020-04-24 MED ORDER — ASPIRIN EC 81 MG PO TBEC
81.0000 mg | DELAYED_RELEASE_TABLET | Freq: Every day | ORAL | Status: DC
  Filled 2020-04-24: qty 1

## 2020-04-24 MED ORDER — CLOPIDOGREL BISULFATE 75 MG PO TABS
75.0000 mg | ORAL_TABLET | Freq: Every day | ORAL | 0 refills | Status: DC
Start: 2020-04-25 — End: 2020-06-02

## 2020-04-24 MED ORDER — ASPIRIN 81 MG PO TBEC
81.0000 mg | DELAYED_RELEASE_TABLET | Freq: Every day | ORAL | 1 refills | Status: DC
Start: 2020-04-24 — End: 2020-08-11

## 2020-04-24 MED ORDER — SACUBITRIL-VALSARTAN 49-51 MG PO TABS
1.0000 | ORAL_TABLET | Freq: Two times a day (BID) | ORAL | Status: DC
  Administered 2020-04-24 (×2): 1 via ORAL
  Filled 2020-04-24 (×3): qty 1

## 2020-04-24 MED ORDER — ALUM & MAG HYDROXIDE-SIMETH 200-200-20 MG/5ML PO SUSP: 30.0000 mL | ORAL | Status: DC | PRN

## 2020-04-24 NOTE — Progress Notes (Signed)
PT Cancellation Note  Patient Details Name: Leslie Esparza MRN: [id number] DOB: 07/06/1970   Cancelled Treatment:    Reason Eval/Treat Not Completed: PT screened, no needs identified, will sign off. Observed patient ambulating earlier in hallway without difficulty without assistive device with OT. Spoke with the OT and with patient who report patient has no apparent acute PT needs at this time and is hopeful to discharge home today.   Minna Merritts, PT, MPT  Percell Locus 04/24/2020, 2:44 PM

## 2020-04-24 NOTE — Progress Notes (Signed)
SLP Cancellation Note  Patient Details Name: ALANNIE AMODIO MRN: [id number] DOB: 1970/08/03   Cancelled treatment:       Reason Eval/Treat Not Completed: SLP screened, no needs identified, will sign off   SLP met with pt and she reports that all symptoms have cognitive linguistic symptoms have resolved. Education provided on seeking a referral to Outpatient ST should any higher level deficits arise at home.   Elfie Costanza B. Rutherford Nail M.S., CCC-SLP, [place] Office [phone]    Stormy Fabian 04/24/2020, 8:33 AM

## 2020-04-24 NOTE — Progress Notes (Signed)
PROGRESS NOTE  Leslie Esparza LKG:[id number] DOB: April 19, 1971   PCP: Cyndi Bender, PA-C  Patient is from: Home  DOA: 04/23/2020 LOS: 0  Chief complaints: "Not feeling right, word finding difficulty, sluggishness"  Brief Narrative / Interim history: 49 year old female with history of combined CHF, PSVT, HTN and migraine headache presenting with not feeling right, word finding difficulty and "sluggishness".  She was admitted for TIA/CVA work-up.  CTH and CTA head and neck without acute finding but nonenlarged and heterogeneous thyroid gland with multiple nodules, reportedly unchanged from 2019 and before.  Initial MRI brain without contrast concerning for possible small cortical infarct in the high posterior left frontal cortex.  Neurology consulted.  Repeat MRI obtained and did not reveal what was noted on initial MRI or any other acute finding.  Twelve-lead EKG with sinus rhythm with minimal ST depression in lateral leads.  Troponin negative.  Patient had no chest pain.  LDL 96.  A1c 5.3%.  TSH within normal.   The next day, no focal neuro symptoms.  Clear speech without aphasia or dysarthria.  Neurology recommended DAPT with Plavix and low-dose aspirin for 3 weeks followed by low-dose aspirin alone and an event monitor.  Cardiology consulted and will arrange an event monitor at home.  She was also started on atorvastatin.  Evaluated by therapy and no need was identified.  Waiting on TTE.  Subjective: Seen and examined earlier this morning and later this afternoon.  No major events overnight of this morning.  No complaint this morning.  Denies headache, vision change, focal numbness, tingling or weakness.  Denies chest pain, shortness of breath, palpitation, GI or UTI symptoms  Objective: Vitals:   04/24/20 0750 04/24/20 1130 04/24/20 1649 04/24/20 1941  BP: 110/64 110/77 104/71 102/67  Pulse: 70 82 78 78  Resp: 18 18 18 16   Temp: 98 F (36.7 C) 98 F (36.7 C) 97.6 F (36.4 C)  98.4 F (36.9 C)  TempSrc: Oral Oral  Oral  SpO2: 100% 100% 98% 97%  Weight:      Height:        Intake/Output Summary (Last 24 hours) at 04/24/2020 2047 Last data filed at 04/24/2020 1850 Gross per 24 hour  Intake 1080 ml  Output 0 ml  Net 1080 ml   Filed Weights   04/23/20 0933 04/23/20 1905  Weight: 68.1 kg 63.7 kg    Examination:  GENERAL: No apparent distress.  Nontoxic. HEENT: MMM.  Vision and hearing grossly intact.  NECK: Supple.  No apparent JVD.  RESP:  No IWOB.  Fair aeration bilaterally. CVS:  RRR. Heart sounds normal.  ABD/GI/GU: BS+. Abd soft, NTND.  MSK/EXT:  Moves extremities. No apparent deformity. No edema.  SKIN: no apparent skin lesion or wound NEURO: Awake, alert and oriented appropriately.  Cranial, motor, sensory, reflexes and rapid alternating movements intact.  No pronator drift.  Finger-to-nose intact. PSYCH: Calm. Normal affect.   Procedures:  None  Microbiology summarized: Limited RVP nonreactive.  Assessment & Plan: TIA/strokelike symptoms: Work-up as above. Initially MRI raised concern but repeat MRI negative.  Atypical migraine?  Neuro symptoms resolved.   -Discharged on DAPT with Plavix and aspirin for 3 weeks followed by aspirin -Atorvastatin 80 mg daily -Cardiology to arrange an event monitor -Follow TTE  Chronic combined CHF CHF: Stable.  Appears euvolemic.  Not on diuretics at baseline. -Continue home Coreg and Entresto -Follow TTE  Essential hypertension: Normotensive. -Cardiac meds as above  History of migraine headache -Continue home Imitrex as needed  Mood disorder: Stable. -Continue home Prozac   Body mass index is 26.12 kg/m.         DVT prophylaxis:  SCD's Start: 04/23/20 1504  Code Status: Full code Family Communication: Updated patient's husband in person. Status is: Observation  The patient remains OBS appropriate and will d/c before 2 midnights.  Dispo: The patient is from: Home               Anticipated d/c is to: Home              Anticipated d/c date is: 1 day              Patient currently is medically stable to d/c.       Consultants:  Neurology Cardiology for an event monitor   Sch Meds:  Scheduled Meds: .  stroke: mapping our early stages of recovery book   Does not apply Once  . aspirin EC  81 mg Oral Daily  . atorvastatin  80 mg Oral Daily  . carvedilol  6.25 mg Oral BID  . cholecalciferol  5,000 Units Oral Daily  . clopidogrel  300 mg Oral Once  . clopidogrel  75 mg Oral Daily  . FLUoxetine  80 mg Oral Daily  . sacubitril-valsartan  1 tablet Oral BID   Continuous Infusions: . sodium chloride 10 mL/hr at 04/23/20 1613   PRN Meds:.acetaminophen **OR** acetaminophen (TYLENOL) oral liquid 160 mg/5 mL **OR** acetaminophen, famotidine, loratadine, SUMAtriptan  Antimicrobials: Anti-infectives (From admission, onward)   None       I have personally reviewed the following labs and images: CBC: Recent Labs  Lab 04/23/20 1020  WBC 6.6  HGB 13.3  HCT 39.5  MCV 86.8  PLT 350   BMP &GFR Recent Labs  Lab 04/23/20 1020  NA 139  K 4.0  CL 105  CO2 24  GLUCOSE 103*  BUN 17  CREATININE 0.55  CALCIUM 9.5   Estimated Creatinine Clearance: 72.1 mL/min (by C-G formula based on SCr of 0.55 mg/dL). Liver & Pancreas: Recent Labs  Lab 04/23/20 1020  AST 36  ALT 69*  ALKPHOS 47  BILITOT 0.7  PROT 7.0  ALBUMIN 4.1   No results for input(s): LIPASE, AMYLASE in the last 168 hours. Recent Labs  Lab 04/23/20 1908  AMMONIA 10   Diabetic: Recent Labs    04/24/20 0431  HGBA1C 5.3   No results for input(s): GLUCAP in the last 168 hours. Cardiac Enzymes: No results for input(s): CKTOTAL, CKMB, CKMBINDEX, TROPONINI in the last 168 hours. No results for input(s): PROBNP in the last 8760 hours. Coagulation Profile: No results for input(s): INR, PROTIME in the last 168 hours. Thyroid Function Tests: Recent Labs    04/23/20 1908  TSH 1.471    Lipid Profile: Recent Labs    04/24/20 0431  CHOL 155  HDL 43  LDLCALC 96  TRIG 78  CHOLHDL 3.6   Anemia Panel: No results for input(s): VITAMINB12, FOLATE, FERRITIN, TIBC, IRON, RETICCTPCT in the last 72 hours. Urine analysis:    Component Value Date/Time   COLORURINE STRAW (A) 04/23/2020 1020   APPEARANCEUR CLEAR (A) 04/23/2020 1020   LABSPEC 1.003 (L) 04/23/2020 1020   PHURINE 8.0 04/23/2020 1020   GLUCOSEU NEGATIVE 04/23/2020 1020   HGBUR MODERATE (A) 04/23/2020 1020   BILIRUBINUR NEGATIVE 04/23/2020 1020   KETONESUR NEGATIVE 04/23/2020 1020   PROTEINUR NEGATIVE 04/23/2020 1020   NITRITE NEGATIVE 04/23/2020 1020   LEUKOCYTESUR NEGATIVE 04/23/2020 1020   Sepsis  Labs: Invalid input(s): PROCALCITONIN, [place]  Microbiology: Recent Results (from the past 240 hour(s))  Respiratory Panel by RT PCR (Flu A&B, Covid) - Nasopharyngeal Swab     Status: None   Collection Time: 04/23/20 10:20 AM   Specimen: Nasopharyngeal Swab  Result Value Ref Range Status   SARS Coronavirus 2 by RT PCR NEGATIVE NEGATIVE Final    Comment: (NOTE) SARS-CoV-2 target nucleic acids are NOT DETECTED.  The SARS-CoV-2 RNA is generally detectable in upper respiratoy specimens during the acute phase of infection. The lowest concentration of SARS-CoV-2 viral copies this assay can detect is 131 copies/mL. A negative result does not preclude SARS-Cov-2 infection and should not be used as the sole basis for treatment or other patient management decisions. A negative result may occur with  improper specimen collection/handling, submission of specimen other than nasopharyngeal swab, presence of viral mutation(s) within the areas targeted by this assay, and inadequate number of viral copies (<131 copies/mL). A negative result must be combined with clinical observations, patient history, and epidemiological information. The expected result is Negative.  Fact Sheet for Patients:   PinkCheek.be  Fact Sheet for Healthcare Providers:  GravelBags.it  This test is no t yet approved or cleared by the [place] FDA and  has been authorized for detection and/or diagnosis of SARS-CoV-2 by FDA under an Emergency Use Authorization (EUA). This EUA will remain  in effect (meaning this test can be used) for the duration of the COVID-19 declaration under Section 564(b)(1) of the Act, 21 U.S.C. section 360bbb-3(b)(1), unless the authorization is terminated or revoked sooner.     Influenza A by PCR NEGATIVE NEGATIVE Final   Influenza B by PCR NEGATIVE NEGATIVE Final    Comment: (NOTE) The Xpert Xpress SARS-CoV-2/FLU/RSV assay is intended as an aid in  the diagnosis of influenza from Nasopharyngeal swab specimens and  should not be used as a sole basis for treatment. Nasal washings and  aspirates are unacceptable for Xpert Xpress SARS-CoV-2/FLU/RSV  testing.  Fact Sheet for Patients: PinkCheek.be  Fact Sheet for Healthcare Providers: GravelBags.it  This test is not yet approved or cleared by the [place] FDA and  has been authorized for detection and/or diagnosis of SARS-CoV-2 by  FDA under an Emergency Use Authorization (EUA). This EUA will remain  in effect (meaning this test can be used) for the duration of the  Covid-19 declaration under Section 564(b)(1) of the Act, 21  U.S.C. section 360bbb-3(b)(1), unless the authorization is  terminated or revoked. Performed at [facility], [address], [place], [place] [address]     Radiology Studies: No results found.    Murlean Seelye T. [place]  If 7PM-7AM, please contact night-coverage [email or web address] 04/24/2020, 8:47 PM

## 2020-04-24 NOTE — Plan of Care (Signed)

## 2020-04-24 NOTE — Progress Notes (Signed)
Mobility Specialist - Progress Note   04/24/20 1146  Mobility  Activity Ambulated in room  Level of Assistance Independent  Assistive Device None  Distance Ambulated (ft) 100 ft  Mobility Response Tolerated well  Mobility performed by Mobility specialist  $Mobility charge 1 Mobility    Pre-mobility: 96 HR, 98% SpO2 Post-mobility: 96 HR, 100% SpO2   Pt was lying in bed with husband present in room upon arrival. Pt agreed to session. Pt denied any pain, nausea, or fatigue. Pt is independent in all transfers this date, including ambulation. Pt was able to don shoes prior to ambulation, independently. Pt ambulated 100' in room with no AD. Pt denied dizziness or weakness during ambulation. No LOB noted. Pt denied SOB. O2 maintained high 90s throughout session. Overall, pt tolerated session well. Pt was left in bed with all needs in reach.   Kathee Delton Mobility Specialist 04/24/20, 11:49 AM

## 2020-04-24 NOTE — Progress Notes (Signed)
Pt admitted to unit. Report given to [facility] from ED nurse. Pt stable. No acute complaints. Pt oriented to unit, belongings at bedside.

## 2020-04-24 NOTE — Telephone Encounter (Signed)
Secure chat received from Dr. Rockey Situ.  Veva Holes, would you or someone else help me? I need to send a zio monitor in the mail to this inpt for TIA, rule out atrial fib/embolism XT fine thx (14) days  Order placed in Epic for zio XT 14 day. Order placed on the [employer] website for the monitor to be mailed to the patients home.

## 2020-04-24 NOTE — Evaluation (Signed)
Occupational Therapy Evaluation Patient Details Name: Leslie Esparza MRN: [id number] DOB: [birth year] Today's Date: 04/24/2020    History of Present Illness 49 y.o. female with medical history significant for chronic systolic heart failure, hypertension, migraine headaches who was brought in by her husband for evaluation of "not feeling right". Patient's husband states that she woke up at 7.30 am and by 7.45am he noted that she had difficulty finding her words. He states that she di not have slurred speech and so he decided to observe her. Patient remained sluggish throughout the course of the day which was concerning to him. He was worried that she may be having a side effect from recently introduced medication and so decided to bring her to the ER for evaluation.   Clinical Impression   Pt reports living at home with husband and 2 adult children. She works part time and is independent in all aspects of care without use of AD. Pt demonstrated independence in ADLs and functional mobility this session without use of AD. No LOB noted.  Pt reports feeling back to baseline at this time. Pt and husband, present in the room, with several questions regarding care and medications and OT directed them to discuss with providers. Pt with no further concerns at this time. OT tp SIGN OFF. Thank you for this referral.     Follow Up Recommendations  No OT follow up    Equipment Recommendations  None recommended by OT       Precautions / Restrictions Precautions Precautions: None      Mobility Bed Mobility Overal bed mobility: Modified Independent      General bed mobility comments: HOB elevated    Transfers Overall transfer level: Modified independent Equipment used: None           Balance Overall balance assessment: Modified Independent           ADL either performed or assessed with clinical judgement   ADL Overall ADL's : Modified independent            Vision Baseline  Vision/History: Wears glasses Wears Glasses: At all times Patient Visual Report: No change from baseline              Pertinent Vitals/Pain Pain Assessment: No/denies pain     Hand Dominance Right   Extremity/Trunk Assessment Upper Extremity Assessment Upper Extremity Assessment: Overall WFL for tasks assessed   Lower Extremity Assessment Lower Extremity Assessment: Overall WFL for tasks assessed   Cervical / Trunk Assessment Cervical / Trunk Assessment: Normal   Communication Communication Communication: No difficulties   Cognition Arousal/Alertness: Awake/alert Behavior During Therapy: WFL for tasks assessed/performed Overall Cognitive Status: Within Functional Limits for tasks assessed                   Home Living Family/patient expects to be discharged to:: Private residence Living Arrangements: Spouse/significant other Available Help at Discharge: Family;Available 24 hours/day Type of Home: House Home Access: Stairs to enter [employer] of Steps: 3 STE Entrance Stairs-Rails: Can reach both Home Layout: One level     Bathroom Shower/Tub: Teacher, early years/pre: Standard     Home Equipment: None          Prior Functioning/Environment Level of Independence: Independent                          OT Goals(Current goals can be found in the care plan section) Acute Rehab OT Goals  Patient Stated Goal: to go home OT Goal Formulation: With patient   AM-PAC OT "6 Clicks" Daily Activity     Outcome Measure Help from another person eating meals?: None Help from another person taking care of personal grooming?: None Help from another person toileting, which includes using toliet, bedpan, or urinal?: None Help from another person bathing (including washing, rinsing, drying)?: None Help from another person to put on and taking off regular upper body clothing?: None Help from another person to put on and taking off regular  lower body clothing?: None 6 Click Score: 24   End of Session    Activity Tolerance: Patient tolerated treatment well Patient left: in bed;with family/visitor present                   Time: 1022-1039 OT Time Calculation (min): 17 min Charges:  OT General Charges $OT Visit: 1 Visit OT Evaluation $OT Eval Low Complexity: 1 Low OT Treatments $Self Care/Home Management : 8-22 mins  Darleen Crocker, MS, OTR/L , CBIS ascom [phone]  04/24/20, 10:51 AM

## 2020-04-24 NOTE — Consult Note (Signed)
Subjective: Feel sback to normal.   Exam: Vitals:   04/24/20 0445 04/24/20 0750  BP: 109/72 110/64  Pulse: 66 70  Resp: 16 18  Temp: 97.7 F (36.5 C) 98 F (36.7 C)  SpO2: 99% 100%   Gen: In bed, NAD Resp: non-labored breathing, no acute distress Abd: soft, nt  Neuro: MS: Awake, alert, oriented CN: Pupils equal round and reactive to light, visual fields full, extraocular movements intact, she has mild flattening of the right nasolabial fold, but reviewing pictures on her phone, I suspect that this is old Motor: 5/5 throughout Sensory: Intact light touch  Pertinent Labs: LDL 96  Impression: 49 year old female with a full day of difficulty with full day of cognitive slowing, unclear etiology.  Findings on the initial MRI have raised the possibility of ischemic as an etiology for her findings, but a repeat MRI did not confirm this.  Given the duration of her symptoms, I would have expected some persistent diffusion change, though difficult to be absolutely certain.  I think that the repeat MRI does raise the possibility that this could be some other etiology such as complicated migraine.  With it being back to normal at this point, I think that further testing exploring etiologies other than ischemia is unlikely to be significantly beneficial unless she were to have further episodes.  With her history of cardiac issues, I do think that repeating an echocardiogram as well as consideration of a prolonged heart monitor would be prudent.  If she were to have atrial fibrillation for her ejection fraction were to have reduced significantly, then I think ischemia would be significantly more likely.  Recommendations: 1) aspirin 81 mg and Plavix 75 mg x3 weeks followed by 81 mg of aspirin daily 2) echocardiogram 3) consider outpatient prolonged event monitor 4) if echo and prolonged monitor are negative, I think this does make ischemia less likely 5) she could follow-up as an outpatient  with neurology  Roland Rack, MD [facility] [phone]  If 7pm- 7am, please page neurology on call as listed in [place].

## 2020-04-25 DIAGNOSIS — Z8669 Personal history of other diseases of the nervous system and sense organs: Secondary | ICD-10-CM | POA: Diagnosis not present

## 2020-04-25 DIAGNOSIS — F32A Depression, unspecified: Secondary | ICD-10-CM | POA: Diagnosis not present

## 2020-04-25 DIAGNOSIS — I5020 Unspecified systolic (congestive) heart failure: Secondary | ICD-10-CM | POA: Diagnosis not present

## 2020-04-25 DIAGNOSIS — I5042 Chronic combined systolic (congestive) and diastolic (congestive) heart failure: Secondary | ICD-10-CM

## 2020-04-25 DIAGNOSIS — I10 Essential (primary) hypertension: Secondary | ICD-10-CM | POA: Diagnosis not present

## 2020-04-25 LAB — ECHOCARDIOGRAM COMPLETE
Area-P 1/2: 3.24 cm2
Calc EF: 32.9 %
Height: 61.5 in
S' Lateral: 3.71 cm
Single Plane A2C EF: 27.5 %
Single Plane A4C EF: 33.7 %
Weight: 2248 oz

## 2020-04-25 NOTE — Discharge Summary (Signed)
Physician Discharge Summary  Leslie Esparza LKG:[id number] DOB: 1971-05-14 DOA: 04/23/2020  PCP: Cyndi Bender, PA-C  Admit date: 04/23/2020 Discharge date: 04/25/2020  Admitted From: Home Disposition: Home  Recommendations for Outpatient Follow-up:  1. Follow ups as below. 2. Please obtain CBC/BMP/Mag at follow up 3. Please follow up on the following pending results: TTE  Home Health: None required Equipment/Devices: None required  Discharge Condition: Stable CODE STATUS: Full code   Hospital Course: 49 year old female with history of combined CHF, PSVT, HTN and migraine headache presenting with not feeling right, word finding difficulty and "sluggishness".She was admitted for TIA/CVA work-up.  CTHand CTA head and neck without acute finding but nonenlarged and heterogeneous thyroid gland with multiple nodules, reportedly unchanged from 2019 and before.Initial MRI brain without contrast concerning for possible small cortical infarct in the high posterior left frontal cortex.Neurology consulted. Repeat MRI obtained and did not reveal what was noted on initial MRI or any other acute finding. Twelve-lead EKG with sinus rhythm with minimal ST depression in lateral leads. Troponin negative. Patient had no chest pain.LDL 96. A1c 5.3%. TSH within normal.   The next day, no focal neuro symptoms. Clear speech without aphasia or dysarthria. Neurology recommended DAPT with Plavix and low-dose aspirin for 3 weeks followed by low-dose aspirin alone andan event monitor. Cardiology consulted and will arrange an event monitor at home.She was also started on atorvastatin.Evaluated by therapy and no need was identified.      On the day of discharge, patient remained stable.  TTE with LVEF of 35 to 40%, global hypokinesis, G1-DD and RVSP of 29.6 mmHg.  TTE finding basically unchanged from prior TTE in 01/2020.  See individual problem list below for more on hospital  course.  Discharge Diagnoses:  TIA/strokelike symptoms: Work-up as above. Initially MRI raised concern but repeat MRI negative. Atypical migraine? Neuro symptoms resolved.  -Discharged on DAPT with Plavix and aspirin for 3 weeks followed by aspirin -Atorvastatin 40 mg daily -Cardiology to arrange an event monitor  Chronic combined CHF CHF: Stable.  TTE unchanged from prior. Appears euvolemic. Not on diuretics at baseline. -Continue home CoregandEntresto -Follow TTE  Essential hypertension: Normotensive. -Cardiac meds as above  History of migraine headache -Continue home Imitrex as needed  Mood disorder: Stable. -Continue home Prozac   Body mass index is 26.02 kg/m.            Discharge Exam: Vitals:   04/25/20 0408 04/25/20 0842  BP: 108/67 114/77  Pulse: 65 82  Resp: 16 16  Temp: 98.2 F (36.8 C) 97.7 F (36.5 C)  SpO2: 99% 100%    GENERAL: No apparent distress.  Nontoxic. HEENT: MMM.  Vision and hearing grossly intact.  NECK: Supple.  No apparent JVD.  RESP:  No IWOB.  Fair aeration bilaterally. CVS:  RRR. Heart sounds normal.  ABD/GI/GU: Bowel sounds present. Soft. Non tender.  MSK/EXT:  Moves extremities. No apparent deformity. No edema.  SKIN: no apparent skin lesion or wound NEURO: Awake, alert and oriented appropriately.  No apparent focal neuro deficit. PSYCH: Calm. Normal affect.  Discharge Instructions  Discharge Instructions    Diet - low sodium heart healthy   Complete by: As directed    Discharge instructions   Complete by: As directed    It has been a pleasure taking care of you!  You were hospitalized with strokelike symptoms likely transient ischemic attack (TIA).  Your initial MRI was inconclusive.  However, your repeat MRI did not show stroke.  We  have started you on aspirin, Plavix and cholesterol medication to reduce your risk of future stroke.  Please review your new medication list and the directions on your medications  before you take them.  Your cardiologist office will call you in the next few days to arrange for an event monitor to make sure there is no abnormal heart rhythm that could have contributed to your symptoms.    Take care,   Increase activity slowly   Complete by: As directed      Allergies as of 04/25/2020      Reactions   Penicillins Rash   had reaction as a child      Medication List    TAKE these medications   aspirin 81 MG EC tablet Take 1 tablet (81 mg total) by mouth daily. Swallow whole.   atorvastatin 40 MG tablet Commonly known as: LIPITOR Take 1 tablet (40 mg total) by mouth daily.   carvedilol 6.25 MG tablet Commonly known as: COREG Take 1 tablet (6.25 mg total) by mouth 2 (two) times daily.   clopidogrel 75 MG tablet Commonly known as: PLAVIX Take 1 tablet (75 mg total) by mouth daily.   Entresto 49-51 MG Generic drug: sacubitril-valsartan Take 1 tablet by mouth 2 (two) times daily.   famotidine 40 MG tablet Commonly known as: PEPCID Take 40 mg by mouth 2 (two) times daily as needed for heartburn or indigestion.   FLUoxetine 40 MG capsule Commonly known as: PROZAC Take 80 mg by mouth daily.   loratadine 10 MG tablet Commonly known as: CLARITIN Take 10 mg by mouth daily as needed for allergies.   NALTREXONE HCL PO Take 4.5 mg by mouth daily as needed (lower back pain).   SUMAtriptan 100 MG tablet Commonly known as: IMITREX Take 100 mg by mouth every 2 (two) hours as needed for migraine or headache. May repeat in 2 hours if headache persists or recurs.   Vitamin D 125 MCG (5000 UT) Caps Take 5,000 Units by mouth daily.       Consultations:  Neurology  Cardiology  Procedures/Studies:  2D Echo on 04/24/2020 1. Left ventricular ejection fraction, by estimation, is 35 to 40%. The  left ventricle has moderately decreased function. The left ventricle  demonstrates global hypokinesis. Left ventricular diastolic parameters are  consistent  with Grade I diastolic  dysfunction (impaired relaxation).  2. Right ventricular systolic function is normal. The right ventricular  size is normal. There is normal pulmonary artery systolic pressure. The  estimated right ventricular systolic pressure is 51.0 mmHg.  3. The mitral valve is normal in structure. Mild mitral valve  regurgitation.    CT Angio Head W or Wo Contrast  Result Date: 04/23/2020 CLINICAL DATA:  Woke up today with speech disturbance. Tiny left parietal infarction suggested by brain MRI. EXAM: CT ANGIOGRAPHY HEAD AND NECK TECHNIQUE: Multidetector CT imaging of the head and neck was performed using the standard protocol during bolus administration of intravenous contrast. Multiplanar CT image reconstructions and MIPs were obtained to evaluate the vascular anatomy. Carotid stenosis measurements (when applicable) are obtained utilizing NASCET criteria, using the distal internal carotid diameter as the denominator. CONTRAST:  48mL OMNIPAQUE IOHEXOL 350 MG/ML SOLN COMPARISON:  Brain MRI same day. Multiple previous thyroid ultrasound studies 2018 and before. Previous neck CT 07/01/2017 FINDINGS: CTA NECK FINDINGS Aortic arch: The aortic arch does not show atherosclerotic disease. Branching pattern is normal without origin stenosis. Right carotid system: Common carotid artery widely patent to the bifurcation. Carotid  bifurcation is normal without soft or calcified plaque. Cervical ICA is tortuous but widely patent. Left carotid system: Common carotid artery widely patent to the bifurcation. No soft or calcified plaque at the carotid bifurcation. No stenosis. Cervical ICA is tortuous but widely patent. Vertebral arteries: Both vertebral artery origins are widely patent. Both vertebral arteries are normal through the cervical region to the foramen magnum. Skeleton: Minimal cervical spondylosis. Other neck: Enlarged and heterogeneous thyroid gland with multiple nodules, unchanged since the  study of January 2019. Largest nodule on the right measures 1.7 cm. Upper chest: Normal Review of the MIP images confirms the above findings CTA HEAD FINDINGS Anterior circulation: Both internal carotid arteries widely patent through the skull base and siphon regions. The anterior and middle cerebral vessels are normal without proximal stenosis, aneurysm or vascular malformation. Small developmental venous anomaly in the left posterior frontal deep white matter. Posterior circulation: Both vertebral arteries widely patent to the basilar. No basilar stenosis. Posterior circulation branch vessels are normal. Venous sinuses: Patent and normal. Anatomic variants: None significant. Review of the MIP images confirms the above findings IMPRESSION: 1. Negative CT angiography of the neck and head. No atherosclerotic disease. No large or medium vessel occlusion. 2. Enlarged and heterogeneous thyroid gland with multiple nodules, unchanged since January 2019 and before. Largest nodule on the right measures 1.7 cm. Nodules previously evaluated with ultrasound and biopsy. Aortic Atherosclerosis (ICD10-I70.0). Electronically Signed   By: Nelson Chimes M.D.   On: 04/23/2020 15:43   CT Head Wo Contrast  Result Date: 04/23/2020 CLINICAL DATA:  Vertigo and dysarthria EXAM: CT HEAD WITHOUT CONTRAST TECHNIQUE: Contiguous axial images were obtained from the base of the skull through the vertex without intravenous contrast. COMPARISON:  None. FINDINGS: Brain: Ventricles and sulci are normal in size and configuration. There is no intracranial mass, hemorrhage, extra-axial fluid collection, or midline shift. The brain parenchyma appears unremarkable. No evident acute infarct. Vascular: No hyperdense vessel.  No evident vascular calcification. Skull: Bony calvarium appears intact. Sinuses/Orbits: There is mucosal thickening and opacification in several ethmoid air cells. Other visualized paranasal sinuses are clear. Visualized orbits  appear symmetric bilaterally. Other: Visualized mastoid air cells are clear. IMPRESSION: Foci of ethmoid sinus disease noted.  Study otherwise unremarkable. Electronically Signed   By: Lowella Grip III M.D.   On: 04/23/2020 11:00   CT Angio Neck W and/or Wo Contrast  Result Date: 04/23/2020 CLINICAL DATA:  Woke up today with speech disturbance. Tiny left parietal infarction suggested by brain MRI. EXAM: CT ANGIOGRAPHY HEAD AND NECK TECHNIQUE: Multidetector CT imaging of the head and neck was performed using the standard protocol during bolus administration of intravenous contrast. Multiplanar CT image reconstructions and MIPs were obtained to evaluate the vascular anatomy. Carotid stenosis measurements (when applicable) are obtained utilizing NASCET criteria, using the distal internal carotid diameter as the denominator. CONTRAST:  32mL OMNIPAQUE IOHEXOL 350 MG/ML SOLN COMPARISON:  Brain MRI same day. Multiple previous thyroid ultrasound studies 2018 and before. Previous neck CT 07/01/2017 FINDINGS: CTA NECK FINDINGS Aortic arch: The aortic arch does not show atherosclerotic disease. Branching pattern is normal without origin stenosis. Right carotid system: Common carotid artery widely patent to the bifurcation. Carotid bifurcation is normal without soft or calcified plaque. Cervical ICA is tortuous but widely patent. Left carotid system: Common carotid artery widely patent to the bifurcation. No soft or calcified plaque at the carotid bifurcation. No stenosis. Cervical ICA is tortuous but widely patent. Vertebral arteries: Both vertebral artery origins are  widely patent. Both vertebral arteries are normal through the cervical region to the foramen magnum. Skeleton: Minimal cervical spondylosis. Other neck: Enlarged and heterogeneous thyroid gland with multiple nodules, unchanged since the study of January 2019. Largest nodule on the right measures 1.7 cm. Upper chest: Normal Review of the MIP images  confirms the above findings CTA HEAD FINDINGS Anterior circulation: Both internal carotid arteries widely patent through the skull base and siphon regions. The anterior and middle cerebral vessels are normal without proximal stenosis, aneurysm or vascular malformation. Small developmental venous anomaly in the left posterior frontal deep white matter. Posterior circulation: Both vertebral arteries widely patent to the basilar. No basilar stenosis. Posterior circulation branch vessels are normal. Venous sinuses: Patent and normal. Anatomic variants: None significant. Review of the MIP images confirms the above findings IMPRESSION: 1. Negative CT angiography of the neck and head. No atherosclerotic disease. No large or medium vessel occlusion. 2. Enlarged and heterogeneous thyroid gland with multiple nodules, unchanged since January 2019 and before. Largest nodule on the right measures 1.7 cm. Nodules previously evaluated with ultrasound and biopsy. Aortic Atherosclerosis (ICD10-I70.0). Electronically Signed   By: Nelson Chimes M.D.   On: 04/23/2020 15:43   MR BRAIN WO CONTRAST  Result Date: 04/23/2020 CLINICAL DATA:  49 year old female with vertigo and dysarthria. Questionable punctate DWI abnormality in the left middle frontal gyrus on routine brain MRI earlier today. Repeat limited study with thin DWI requested. EXAM: MRI HEAD WITHOUT CONTRAST LIMITED TECHNIQUE: Multiplanar thin slice dwi obtained without intravenous contrast. COMPARISON:  MRI 1441 hours today. FINDINGS: Three plane thin slice DWI imaging. The questioned punctate focus of restriction in the left middle frontal gyrus does not persist on these images. A conventional axial 3 mm DWI sequence was also repeated, and too appears normal. No restricted or abnormal diffusion is identified. Visible cerebral morphology is unchanged. No intracranial mass effect or ventriculomegaly. IMPRESSION: Three plane thin slice DWI imaging appears normal. The  questioned punctate lesion in the left middle frontal gyrus does not persist. Electronically Signed   By: Genevie Ann M.D.   On: 04/23/2020 18:53   MR BRAIN WO CONTRAST  Result Date: 04/23/2020 CLINICAL DATA:  Mental status change, unknown cause. EXAM: MRI HEAD WITHOUT CONTRAST TECHNIQUE: Multiplanar, multiecho pulse sequences of the brain and surrounding structures were obtained without intravenous contrast. COMPARISON:  CT head from the same day. FINDINGS: Brain: Small focus of apparent restricted diffusion in the high posterior left frontal lobe (series 5 and 6, image 34). This area is not confirmed on coronal diffusion weighted imaging, likely because it is not covered by the slice selection. No edema or mass effect. No acute hemorrhage, hydrocephalus, extra-axial collection or mass lesion. Focal T2/FLAIR hyperintensity in the posterior left frontal subcortical white matter likely represents gliosis surrounding a developmental venous anomaly given characteristic prominent draining vein on SWI in this region (series 13, image 35). Vascular: Major arterial flow voids are maintained at the skull base. Skull and upper cervical spine: Normal marrow signal. Sinuses/Orbits: Pansinus mucosal thickening. Other: No mastoid effusions. IMPRESSION: 1. Possible small cortical infarct in the high posterior left frontal cortex (series 5 and 6, image 34). No associated edema or mass effect. 2. Focal T2/FLAIR hyperintensity in the posterior left frontal subcortical white matter is compatible with gliosis surrounding a developmental venous anomaly given characteristic prominent draining vein on SWI in this region. 3. Pansinus mucosal thickening without air-fluid levels. Electronically Signed   By: Margaretha Sheffield MD   On: 04/23/2020  14:35   ECHOCARDIOGRAM COMPLETE  Result Date: 04/25/2020    ECHOCARDIOGRAM REPORT   Patient Name:   Leslie Esparza Date of Exam: 04/24/2020 Medical Rec #:  [id number]          Height:        61.5 in Accession #:    [id number]         Weight:       140.5 lb Date of Birth:  March 30, 1971         BSA:          1.635 m Patient Age:    26 years           BP:           101/73 mmHg Patient Gender: F                  HR:           74 bpm. Exam Location:  [facility] Procedure: 2D Echo Indications:     Stroke 434.91/I163.9  History:         Patient has prior history of Echocardiogram examinations, most                  recent 02/01/2020. Stroke; Risk Factors:Hypertension. Heart                  failure with reduce EF.  Sonographer:     Avanell Shackleton Referring Phys:  [id number] Mercy Riding Diagnosing Phys: Ida Rogue MD IMPRESSIONS  1. Left ventricular ejection fraction, by estimation, is 35 to 40%. The left ventricle has moderately decreased function. The left ventricle demonstrates global hypokinesis. Left ventricular diastolic parameters are consistent with Grade I diastolic dysfunction (impaired relaxation).  2. Right ventricular systolic function is normal. The right ventricular size is normal. There is normal pulmonary artery systolic pressure. The estimated right ventricular systolic pressure is 94.8 mmHg.  3. The mitral valve is normal in structure. Mild mitral valve regurgitation. FINDINGS  Left Ventricle: Left ventricular ejection fraction, by estimation, is 35 to 40%. The left ventricle has moderately decreased function. The left ventricle demonstrates global hypokinesis. The left ventricular internal cavity size was normal in size. There is no left ventricular hypertrophy. Left ventricular diastolic parameters are consistent with Grade I diastolic dysfunction (impaired relaxation). Right Ventricle: The right ventricular size is normal. No increase in right ventricular wall thickness. Right ventricular systolic function is normal. There is normal pulmonary artery systolic pressure. The tricuspid regurgitant velocity is 2.48 m/s, and  with an assumed right atrial pressure of 5 mmHg, the estimated right  ventricular systolic pressure is 54.6 mmHg. Left Atrium: Left atrial size was normal in size. Right Atrium: Right atrial size was normal in size. Pericardium: There is no evidence of pericardial effusion. Mitral Valve: The mitral valve is normal in structure. Mild mitral valve regurgitation. No evidence of mitral valve stenosis. Tricuspid Valve: The tricuspid valve is normal in structure. Tricuspid valve regurgitation is mild . No evidence of tricuspid stenosis. Aortic Valve: The aortic valve is normal in structure. Aortic valve regurgitation is not visualized. No aortic stenosis is present. Pulmonic Valve: The pulmonic valve was normal in structure. Pulmonic valve regurgitation is not visualized. No evidence of pulmonic stenosis. Aorta: The aortic root is normal in size and structure. Venous: The inferior vena cava is normal in size with greater than 50% respiratory variability, suggesting right atrial pressure of 3 mmHg. IAS/Shunts: No atrial level shunt detected by color flow Doppler.  LEFT VENTRICLE PLAX 2D LVIDd:         4.85 cm      Diastology LVIDs:         3.71 cm      LV e' medial:    5.55 cm/s LV PW:         0.68 cm      LV E/e' medial:  11.4 LV IVS:        0.66 cm      LV e' lateral:   11.10 cm/s                             LV E/e' lateral: 5.7  LV Volumes (MOD) LV vol d, MOD A2C: 133.0 ml LV vol d, MOD A4C: 142.0 ml LV vol s, MOD A2C: 96.4 ml LV vol s, MOD A4C: 94.2 ml LV SV MOD A2C:     36.6 ml LV SV MOD A4C:     142.0 ml LV SV MOD BP:      47.1 ml IVC IVC diam: 0.87 cm LEFT ATRIUM           Index       RIGHT ATRIUM           Index LA diam:      3.80 cm 2.32 cm/m  RA Area:     10.30 cm LA Vol (A4C): 29.5 ml 18.04 ml/m RA Volume:   17.80 ml  10.88 ml/m   AORTA Ao Root diam: 3.00 cm MITRAL VALVE               TRICUSPID VALVE MV Area (PHT): 3.24 cm    TR Peak grad:   24.6 mmHg MV Decel Time: 234 msec    TR Vmax:        248.00 cm/s MV E velocity: 63.40 cm/s MV A velocity: 84.80 cm/s MV E/A ratio:  0.75  Ida Rogue MD Electronically signed by Ida Rogue MD Signature Date/Time: 04/25/2020/2:02:05 PM    Final         The results of significant diagnostics from this hospitalization (including imaging, microbiology, ancillary and laboratory) are listed below for reference.     Microbiology: Recent Results (from the past 240 hour(s))  Respiratory Panel by RT PCR (Flu A&B, Covid) - Nasopharyngeal Swab     Status: None   Collection Time: 04/23/20 10:20 AM   Specimen: Nasopharyngeal Swab  Result Value Ref Range Status   SARS Coronavirus 2 by RT PCR NEGATIVE NEGATIVE Final    Comment: (NOTE) SARS-CoV-2 target nucleic acids are NOT DETECTED.  The SARS-CoV-2 RNA is generally detectable in upper respiratoy specimens during the acute phase of infection. The lowest concentration of SARS-CoV-2 viral copies this assay can detect is 131 copies/mL. A negative result does not preclude SARS-Cov-2 infection and should not be used as the sole basis for treatment or other patient management decisions. A negative result may occur with  improper specimen collection/handling, submission of specimen other than nasopharyngeal swab, presence of viral mutation(s) within the areas targeted by this assay, and inadequate number of viral copies (<131 copies/mL). A negative result must be combined with clinical observations, patient history, and epidemiological information. The expected result is Negative.  Fact Sheet for Patients:  PinkCheek.be  Fact Sheet for Healthcare Providers:  GravelBags.it  This test is no t yet approved or cleared by the [place] FDA and  has been authorized for detection and/or diagnosis of SARS-CoV-2  by FDA under an Emergency Use Authorization (EUA). This EUA will remain  in effect (meaning this test can be used) for the duration of the COVID-19 declaration under Section 564(b)(1) of the Act, 21 U.S.C. section  360bbb-3(b)(1), unless the authorization is terminated or revoked sooner.     Influenza A by PCR NEGATIVE NEGATIVE Final   Influenza B by PCR NEGATIVE NEGATIVE Final    Comment: (NOTE) The Xpert Xpress SARS-CoV-2/FLU/RSV assay is intended as an aid in  the diagnosis of influenza from Nasopharyngeal swab specimens and  should not be used as a sole basis for treatment. Nasal washings and  aspirates are unacceptable for Xpert Xpress SARS-CoV-2/FLU/RSV  testing.  Fact Sheet for Patients: PinkCheek.be  Fact Sheet for Healthcare Providers: GravelBags.it  This test is not yet approved or cleared by the [place] FDA and  has been authorized for detection and/or diagnosis of SARS-CoV-2 by  FDA under an Emergency Use Authorization (EUA). This EUA will remain  in effect (meaning this test can be used) for the duration of the  Covid-19 declaration under Section 564(b)(1) of the Act, 21  U.S.C. section 360bbb-3(b)(1), unless the authorization is  terminated or revoked. Performed at [facility], [address] [place],  [address]      Labs: BNP (last 3 results) No results for input(s): BNP in the last 8760 hours. Basic Metabolic Panel: Recent Labs  Lab 04/23/20 1020  NA 139  K 4.0  CL 105  CO2 24  GLUCOSE 103*  BUN 17  CREATININE 0.55  CALCIUM 9.5   Liver Function Tests: Recent Labs  Lab 04/23/20 1020  AST 36  ALT 69*  ALKPHOS 47  BILITOT 0.7  PROT 7.0  ALBUMIN 4.1   No results for input(s): LIPASE, AMYLASE in the last 168 hours. Recent Labs  Lab 04/23/20 1908  AMMONIA 10   CBC: Recent Labs  Lab 04/23/20 1020  WBC 6.6  HGB 13.3  HCT 39.5  MCV 86.8  PLT 350   Cardiac Enzymes: No results for input(s): CKTOTAL, CKMB, CKMBINDEX, TROPONINI in the last 168 hours. BNP: Invalid input(s): POCBNP CBG: No results for input(s): GLUCAP in the last 168 hours. D-Dimer No results for  input(s): DDIMER in the last 72 hours. Hgb A1c Recent Labs    04/24/20 0431  HGBA1C 5.3   Lipid Profile Recent Labs    04/24/20 0431  CHOL 155  HDL 43  LDLCALC 96  TRIG 78  CHOLHDL 3.6   Thyroid function studies Recent Labs    04/23/20 1908  TSH 1.471   Anemia work up No results for input(s): VITAMINB12, FOLATE, FERRITIN, TIBC, IRON, RETICCTPCT in the last 72 hours. Urinalysis    Component Value Date/Time   COLORURINE STRAW (A) 04/23/2020 1020   APPEARANCEUR CLEAR (A) 04/23/2020 1020   LABSPEC 1.003 (L) 04/23/2020 1020   PHURINE 8.0 04/23/2020 1020   GLUCOSEU NEGATIVE 04/23/2020 1020   HGBUR MODERATE (A) 04/23/2020 1020   BILIRUBINUR NEGATIVE 04/23/2020 1020   KETONESUR NEGATIVE 04/23/2020 1020   PROTEINUR NEGATIVE 04/23/2020 1020   NITRITE NEGATIVE 04/23/2020 1020   LEUKOCYTESUR NEGATIVE 04/23/2020 1020   Sepsis Labs Invalid input(s): PROCALCITONIN,  WBC,  LACTICIDVEN   Time coordinating discharge: 35 minutes  SIGNED:  Mercy Riding, MD  [facility] 04/25/2020, 3:18 PM  If 7PM-7AM, please contact night-coverage [email or web address]

## 2020-04-25 NOTE — Plan of Care (Signed)

## 2020-04-25 NOTE — Plan of Care (Signed)
Pt ready for discharge IV removed Discharge instructions reviewed with patient and spouse Time allowed for questions and concerns Verbalizes an understanding on discharge plan  Denies any additional wants or needs at this time  Problem: Education: Goal: Knowledge of General Education information will improve Description: Including pain rating scale, medication(s)/side effects and non-pharmacologic comfort measures Outcome: Adequate for Discharge   Problem: Health Behavior/Discharge Planning: Goal: Ability to manage health-related needs will improve Outcome: Adequate for Discharge   Problem: Clinical Measurements: Goal: Ability to maintain clinical measurements within normal limits will improve Outcome: Adequate for Discharge Goal: Will remain free from infection Outcome: Adequate for Discharge Goal: Diagnostic test results will improve Outcome: Adequate for Discharge Goal: Respiratory complications will improve Outcome: Adequate for Discharge Goal: Cardiovascular complication will be avoided Outcome: Adequate for Discharge   Problem: Activity: Goal: Risk for activity intolerance will decrease Outcome: Adequate for Discharge   Problem: Nutrition: Goal: Adequate nutrition will be maintained Outcome: Adequate for Discharge   Problem: Coping: Goal: Level of anxiety will decrease Outcome: Adequate for Discharge   Problem: Elimination: Goal: Will not experience complications related to bowel motility Outcome: Adequate for Discharge Goal: Will not experience complications related to urinary retention Outcome: Adequate for Discharge   Problem: Pain Managment: Goal: General experience of comfort will improve Outcome: Adequate for Discharge   Problem: Safety: Goal: Ability to remain free from injury will improve Outcome: Adequate for Discharge   Problem: Skin Integrity: Goal: Risk for impaired skin integrity will decrease Outcome: Adequate for Discharge   Problem:  Education: Goal: Knowledge of disease or condition will improve Outcome: Adequate for Discharge Goal: Knowledge of secondary prevention will improve Outcome: Adequate for Discharge

## 2020-04-28 ENCOUNTER — Telehealth: Payer: Self-pay | Admitting: Cardiology

## 2020-04-28 MED ORDER — CARVEDILOL 6.25 MG PO TABS
6.2500 mg | ORAL_TABLET | Freq: Two times a day (BID) | ORAL | 3 refills | Status: DC
Start: 2020-04-28 — End: 2020-06-18

## 2020-04-28 NOTE — Telephone Encounter (Signed)
Received fax from [facility] in [facility] requesting refills of Carvedilol 6.25mg  BID. Rx request sent to pharmacy.

## 2020-05-02 ENCOUNTER — Ambulatory Visit (INDEPENDENT_AMBULATORY_CARE_PROVIDER_SITE_OTHER): Payer: 59

## 2020-05-02 DIAGNOSIS — G459 Transient cerebral ischemic attack, unspecified: Secondary | ICD-10-CM

## 2020-05-15 ENCOUNTER — Telehealth: Payer: Self-pay

## 2020-05-15 NOTE — Telephone Encounter (Signed)
Spoke with patient regarding her below copied and pasted MyChart message:  Hi, I was hospitalized at [facility] on 04/23/20 through 04/25/20 for strokelike symptoms. As part of my outpatient care I was mailed a heart monitor and started wearing it on November 19th. How long do I need to wear it? Do I need to make a follow up appointment with your office?  Also, Dr. Cyndia Skeeters, at [facility] prescribed Plavix and aspirin. I have 4 Plavix left. My understanding was that I would take it for three weeks and then discontinue it, but continue with the aspirin. Can you advise me on this? Thank you.  I informed her to remove the monitor tomorrow (05/16/20) as that will be 14 days. I also told her per the inpatient notes I read that she is to stop the Plavix when she runs out, it was for a 15 day course and then continue with low dose Aspirin. I scheduled her for a follow up with Laurann Montana on 06/02/20 to follow up with Zio report and recent hospital admission.  Patient verbalized understanding and agreed with plan.

## 2020-06-02 ENCOUNTER — Ambulatory Visit (INDEPENDENT_AMBULATORY_CARE_PROVIDER_SITE_OTHER): Payer: 59 | Admitting: Family

## 2020-06-02 ENCOUNTER — Other Ambulatory Visit: Payer: Self-pay

## 2020-06-02 ENCOUNTER — Encounter: Payer: Self-pay | Admitting: Family

## 2020-06-02 VITALS — BP 110/68 | HR 68 | Ht 61.5 in | Wt 138.4 lb

## 2020-06-02 DIAGNOSIS — E782 Mixed hyperlipidemia: Secondary | ICD-10-CM

## 2020-06-02 DIAGNOSIS — Z8673 Personal history of transient ischemic attack (TIA), and cerebral infarction without residual deficits: Secondary | ICD-10-CM

## 2020-06-02 DIAGNOSIS — G43909 Migraine, unspecified, not intractable, without status migrainosus: Secondary | ICD-10-CM

## 2020-06-02 DIAGNOSIS — I428 Other cardiomyopathies: Secondary | ICD-10-CM

## 2020-06-02 DIAGNOSIS — I5020 Unspecified systolic (congestive) heart failure: Secondary | ICD-10-CM | POA: Diagnosis not present

## 2020-06-02 DIAGNOSIS — I10 Essential (primary) hypertension: Secondary | ICD-10-CM | POA: Diagnosis not present

## 2020-06-02 MED ORDER — SPIRONOLACTONE 25 MG PO TABS: 12.5000 mg | ORAL_TABLET | Freq: Every day | ORAL | 6 refills | Status: DC

## 2020-06-02 NOTE — Progress Notes (Signed)
Office Visit    Patient Name: Leslie Esparza Date of Encounter: 06/02/2020  Primary Care Provider:  Cyndi Bender, PA-C Primary Cardiologist:  Kate Sable, MD Electrophysiologist:  None   Chief Complaint    Leslie Esparza is a 49 y.o. female with a hx of migraine, hypertension, hyperlipidemia, nonischemic cardiomyopathy/heart failure with reduced ejection fraction presents today for follow-up after ZIO monitor.  Past Medical History    Past Medical History:  Diagnosis Date  . Allergic rhinitis   . CHF (congestive heart failure) ([place])   . Common migraine   . Hypertension   . Mitral valve prolapse   . OCD (obsessive compulsive disorder)   . Osteoarthritis of thumb   . Recurrent cold sores    Past Surgical History:  Procedure Laterality Date  . CESAREAN SECTION    . LEFT HEART CATH AND CORONARY ANGIOGRAPHY Left 02/11/2020   Procedure: LEFT HEART CATH AND CORONARY ANGIOGRAPHY;  Surgeon: Wellington Hampshire, MD;  Location: [facility];  Service: Cardiovascular;  Laterality: Left;  . WISDOM TOOTH EXTRACTION      Allergies  Allergies  Allergen Reactions  . Penicillins Rash    had reaction as a child    History of Present Illness    Leslie Esparza is a 49 y.o. female with a hx of migraine, hypertension, hyperlipidemia, nonischemic cardiomyopathy/heart failure reduced ejection fraction last seen 04/03/2020 by Dr Garen Lah.  She had echocardiogram 02/01/2020 with moderately reduced LVEF 35-40%.  Subsequent LHC 02/11/2020 with no evidence of coronary disease.  She underwent cardiac MRI 03/2020 with LVEF 57% and no evidence for scar or infiltrative disease.  At last clinic visit 04/03/20 her doses of Coreg and Entresto were continued and she was recommended for repeat echo in 6 months.  She was admitted 04/23/2020 to 04/25/2020 for TIA/CVA workup after presenting with word finding difficulty and sluggishness.. CTH and CTA head and neck without acute  finding but nonenlarged and heterogenous thyroid gland with multiple nodules, reportedly unchanged from 2019 and before.  Initial MRI brain without contrast concerning for possible small cortical infarct in the high posterior left frontal cortex.  Neurology consulted.  Repeat MRI with no abnormality.  LDL 96, A1c 5.3, TSH normal.  Seen by neurology and recommended for Plavix for 3 weeks then low-dose aspirin and event monitor.  She was also started on atorvastatin.  Echocardiogram on day of discharge with LVEF 16-07%, grade 1 diastolic dysfunction.  Discharge summary notes TIA versus atypical migraine as etiology of the event.  Seen today in follow-up. 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  She has been working very hard on her diet to lose weight and has lost 12 pounds since last seen 2 months ago.  Reports no chest pain, pressure, tightness.  Reports no shortness of breath at rest no dyspnea exertion. No edema, orthopnea, PND. Tells me her neurological symptoms have resolved and not recurred.  She does not have follow-up with neurology but is interested in neurology follow-up.  We reviewed the preliminary report of her monitor showing predominantly NSR with average heart rate of 71 bpm. 1 run of SVT which was 13 beats with max rate of 136bpm. She had rare PAC/PVC. No evidence of atrial fibrillation.   EKGs/Labs/Other Studies Reviewed:   The following studies were reviewed today:  EKG:  No EKG today.  Recent Labs: 04/23/2020: ALT  69; BUN 17; Creatinine, Ser 0.55; Hemoglobin 13.3; Platelets 350; Potassium 4.0; Sodium 139; TSH 1.471  Recent Lipid Panel    Component Value Date/Time   CHOL 155 04/24/2020 0431   TRIG 78 04/24/2020 0431   HDL 43 04/24/2020 0431   CHOLHDL 3.6 04/24/2020 0431   VLDL 16 04/24/2020 0431   LDLCALC 96 04/24/2020 0431    Home Medications   Current Meds  Medication Sig  .  aspirin EC 81 MG EC tablet Take 1 tablet (81 mg total) by mouth daily. Swallow whole.  Marland Kitchen atorvastatin (LIPITOR) 40 MG tablet Take 1 tablet (40 mg total) by mouth daily.  . carvedilol (COREG) 6.25 MG tablet Take 1 tablet (6.25 mg total) by mouth 2 (two) times daily.  . Cholecalciferol (VITAMIN D) 125 MCG (5000 UT) CAPS Take 5,000 Units by mouth daily.  . famotidine (PEPCID) 40 MG tablet Take 40 mg by mouth 2 (two) times daily as needed for heartburn or indigestion.  Marland Kitchen FLUoxetine (PROZAC) 40 MG capsule Take 80 mg by mouth daily.  Marland Kitchen loratadine (CLARITIN) 10 MG tablet Take 10 mg by mouth daily as needed for allergies.  Marland Kitchen NALTREXONE HCL PO Take 4.5 mg by mouth daily as needed (lower back pain).   . sacubitril-valsartan (ENTRESTO) 49-51 MG Take 1 tablet by mouth 2 (two) times daily.  . SUMAtriptan (IMITREX) 100 MG tablet Take 100 mg by mouth every 2 (two) hours as needed for migraine or headache. May repeat in 2 hours if headache persists or recurs.  . [DISCONTINUED] clopidogrel (PLAVIX) 75 MG tablet Take 1 tablet (75 mg total) by mouth daily.     Review of Systems      All other systems reviewed and are otherwise negative except as noted above.  Physical Exam    VS:  BP 110/68 (BP Location: Left Arm, Patient Position: Sitting, Cuff Size: Normal)   Pulse 68   Ht 5' 1.5" (1.562 m)   Wt 138 lb 6.4 oz (62.8 kg)   SpO2 98%   BMI 25.73 kg/m  , BMI Body mass index is 25.73 kg/m.  Wt Readings from Last 3 Encounters:  06/02/20 138 lb 6.4 oz (62.8 kg)  04/25/20 140 lb (63.5 kg)  04/03/20 150 lb 2 oz (68.1 kg)    GEN: Well nourished, well developed, in no acute distress. HEENT: normal. Neck: Supple, no JVD, carotid bruits, or masses. Cardiac: RRR, no murmurs, rubs, or gallops. No clubbing, cyanosis, edema.  Radials/PT 2+ and equal bilaterally.  Respiratory:  Respirations regular and unlabored, clear to auscultation bilaterally. GI: Soft, nontender, nondistended. MS: No deformity or  atrophy. Skin: Warm and dry, no rash. Neuro:  Strength and sensation are intact. Psych: Normal affect.  Assessment & Plan    1. HFrEF / NICM - Echo 02/11/20 LVEF 35-40%. Cardiac MRI 04/2020 with LVEF 57% and no infiltrative abnormality. Maintained on Entresto and Coreg. Repeat echo 05/29/20 during admission with LVEF 35-40%. Images reviewed by Dr. Garen Lah in clinic, poor quality imaging limiting interpretation of LVEF. Discussed plan of care with him and patient. Low suspicion EF has reduced significantly from cardiac MRI. Particularly as she is NYHA I with no edema, orthopnea, shortness of breath and down 13 pounds due to dietary changes. Continue Coreg, Entresto. Start Spironolactone 12.5mg  daily. BMP in 1[facility]2 weeks at the [facility]. Repeat echo in April 2022 as scheduled.   2. ?TIA vs complex migraine - Recent admission with questionable TIA versus complex migraine. Will refer to neurology for follow  up.She has completed 3 weeks of Plavix and is maintained on Aspirin. Reports no recurrent neurological symptoms. 2 week ZIO monitor detailed above with no evidence of atrial fibrillation nor significant arrhythmia.   3. HTN - BP well controlled. Continue current antihypertensive regimen. Continue Coreg and Entresto.  4. HLD - Lipid panel 02/07/20 with LDL 165. Repeat lipid panel 04/24/20 LDL 96. She was started on Atorvastatin during recent admission for questionable TIA. Tolerating without difficulty.   Disposition: Follow up in April after echocardiogram with Dr. Garen Lah   Signed, Loel Dubonnet, NP 06/02/2020, 8:17 PM [place]

## 2020-06-02 NOTE — Patient Instructions (Addendum)
Medication Instructions:  Your physician has recommended you make the following change in your medication:   START Spironolactone 12.5mg  once daily - An Rx was sent to your pharmacy (Spironolactone 25mg  take 1/2 tablet once daily)  *If you need a refill on your cardiac medications before your next appointment, please call your pharmacy*   Lab Work:  -  Your physician recommends that you return for lab work at the [facility] in 1-2 weeks: [facility]  -  Please go to the [facility]. You will check in at the front desk to the right as you walk into the atrium. Valet Parking is offered if needed. - No appointment needed. You may go any day between 7 am and 6 pm.     Testing/Procedures: We have referred you to neurology for routine follow up.  Loel Dubonnet, NP will check with Dr. Garen Lah if he wants you to have the repeat echocardiogram in April. She will send you a MyChart message with an update.   Follow-Up: At [facility], you and your health needs are our priority.  As part of our continuing mission to provide you with exceptional heart care, we have created designated Provider Care Teams.  These Care Teams include your primary Cardiologist (physician) and Advanced Practice Providers (APPs -  Physician Assistants and Nurse Practitioners) who all work together to provide you with the care you need, when you need it.  We recommend signing up for the patient portal called "MyChart".  Sign up information is provided on this After Visit Summary.  MyChart is used to connect with patients for Virtual Visits (Telemedicine).  Patients are able to view lab/test results, encounter notes, upcoming appointments, etc.  Non-urgent messages can be sent to your provider as well.   To learn more about what you can do with MyChart, go to [email or web address].    Your next appointment:   In April  The format for your next appointment:   In Person  Provider:   You may see Kate Sable, MD or one of the following Advanced Practice Providers on your designated Care Team:    Murray Hodgkins, NP  Christell Faith, PA-C  Marrianne Mood, PA-C  Cadence Netcong, [place]  Laurann Montana, NP  Other Instructions  Keep up the good work with your weight loss!

## 2020-06-16 ENCOUNTER — Telehealth: Payer: Self-pay

## 2020-06-16 NOTE — Telephone Encounter (Signed)
Able to reach pt regarding her recent ZIO monitor wear, Dr. Mariah Milling had a chance to review his results and advised   "Very rare episode of tachycardia, one episode over the duration of the monitor short-lived only 14 beats  Most triggers not associated with significant arrhythmia"  No new medications or recommendation at this time, otherwise all questions or concerns were address and no additional concerns at this time. Will call back for anything further.

## 2020-06-16 NOTE — Telephone Encounter (Signed)
-----   Message from Antonieta Iba, MD sent at 06/15/2020  4:48 PM EST ----- Event monitor Very rare episode of tachycardia, one episode over the duration of the monitor short-lived only 14 beats Most triggers not associated with significant arrhythmia

## 2020-06-17 ENCOUNTER — Other Ambulatory Visit
Admission: RE | Admit: 2020-06-17 | Discharge: 2020-06-17 | Disposition: A | Discharge status: Home / Self Care | Payer: 59 | Attending: Family | Admitting: Family

## 2020-06-17 DIAGNOSIS — I5020 Unspecified systolic (congestive) heart failure: Secondary | ICD-10-CM | POA: Insufficient documentation

## 2020-06-17 LAB — BASIC METABOLIC PANEL
Anion gap: 10 (ref 5–15)
BUN: 22 mg/dL — ABNORMAL HIGH (ref 6–20)
CO2: 25 mmol/L (ref 22–32)
Calcium: 9.5 mg/dL (ref 8.9–10.3)
Chloride: 101 mmol/L (ref 98–111)
Creatinine, Ser: 0.63 mg/dL (ref 0.44–1.00)
GFR, Estimated: >60 mL/min (ref >60)
Glucose, Bld: 127 mg/dL — ABNORMAL HIGH (ref 70–99)
Potassium: 4.3 mmol/L (ref 3.5–5.1)
Sodium: 136 mmol/L (ref 135–145)

## 2020-06-18 MED ORDER — CARVEDILOL 3.125 MG PO TABS
3.1250 mg | ORAL_TABLET | Freq: Two times a day (BID) | ORAL | Status: DC
Start: 2020-06-18 — End: 2020-07-11

## 2020-06-18 NOTE — Telephone Encounter (Signed)
Called the patient after receiving her mychart message.  Patient sts that she had an episode of pre-syncope while taking a shower this morning. She denies syncope, injury other symptoms. Confirmed that she is taking Carvedilol 6.25 mg bid Entresto 49/51 mg bid Spironolactone 12.5 mg daily. (marked as HOLD in the pt chart, did not remove form the med list) She has held carvedilol and spironolactone this morning. She rechecked her BP around noon 88/58.  Adv the patient that I will discuss with Dr. Azucena Cecil and call back with his recommendation.  Per Dr. Myriam Forehand- [place] Pt should  Reduce carvedilol to 3.125 mg bid Hold spironolactone for now Continue entresto 49/51 bid as prescribed. She should continue to monitor her BP and contact the office if it remains low.  Adv the patient to contact the office if she develops swelling, sob, or has an episode of pre-syncope or syncope.   Patient verbalized understanding and voiced appreciation for the call.

## 2020-06-20 ENCOUNTER — Telehealth (HOSPITAL_COMMUNITY): Payer: Self-pay | Admitting: Vascular Surgery

## 2020-06-20 NOTE — Telephone Encounter (Signed)
Left pt message giving new pt appt

## 2020-07-11 ENCOUNTER — Telehealth: Payer: Self-pay | Admitting: Cardiology

## 2020-07-11 MED ORDER — CARVEDILOL 3.125 MG PO TABS
3.1250 mg | ORAL_TABLET | Freq: Two times a day (BID) | ORAL | 3 refills | Status: DC
Start: 2020-07-11 — End: 2020-10-06

## 2020-07-11 NOTE — Telephone Encounter (Signed)
Received fax from [facility] in [place] requesting refills for Carvedilol 3.125mg . Rx request sent to pharmacy.

## 2020-07-21 ENCOUNTER — Other Ambulatory Visit: Payer: Self-pay

## 2020-07-21 ENCOUNTER — Telehealth: Payer: Self-pay

## 2020-07-21 MED ORDER — ATORVASTATIN CALCIUM 40 MG PO TABS
40.0000 mg | ORAL_TABLET | Freq: Every day | ORAL | 5 refills | Status: DC
Start: 2020-07-21 — End: 2020-10-01

## 2020-07-21 NOTE — Telephone Encounter (Signed)
Refilled patients Lipitor as requested in her MyChart message request, and clarified the dose she needs to be on.

## 2020-08-10 NOTE — Progress Notes (Signed)
ADVANCED HF CLINIC CONSULT NOTE  Referring Physician: Kate Sable, MD Primary Care: Cyndi Bender, PA-C Primary Cardiologist: Kate Sable, MD   HPI:  Leslie Esparza is a 50 y.o. with migraines, HTN, HL and systolic HF due to NICM.  Began to have palpitations in 6/21. She had echocardiogram 8/21 with moderately reduced LVEF 35-40%.  Subsequent LHC 02/11/2020 with no evidence of CAD.  She underwent cardiac MRI 03/2020 with LVEF 57% and no evidence for scar or infiltrative disease.   Admitted 11/21 for TIA/CVA workup after presenting with word finding difficulty and sluggishness.. CTH and CTA head and neck without acute finding but nonenlarged and heterogenous thyroid gland with multiple nodules, reportedly unchanged from 2019 and before.  Initial MRI brain without contrast concerning for possible small cortical infarct in the high posterior left frontal cortex.  Neurology consulted.  Repeat MRI with no abnormality.  Echo on day of discharge with LVEF 83-41%, grade 1 diastolic dysfunction. I reviewed personally and EF felt to be 40-45% with septal dyssynergy and inferior wall HK  Here with her husband. Says she spends most of her time at home. Able to do housework without too much problem. Not very active. Wakes up feeling tired but can go and do her things. Can walk 1 mile without too much difficulty. No edema, orthopnea or PND. Complaint with meds. Had episdoe about 2 months where she almost passed out in shower and diuretic was stopped and carvedilol cut in half. Feels dizzy if she moves too fast. Has lost weight and now snores minimally.   Zio patch 7/21: SR. Occasional SVT. Rare PVCs.   Zio patch 12/21: SR. One 14 beat run SVT. Rare PACs/PVCs   FHx: Paternal uncle had HF Paternal had CAD    Review of Systems: [y] = yes, [ ]  = no   General: Weight gain [ ] ; Weight loss [ ] ; Anorexia [ ] ; Fatigue [ ] ; Fever [ ] ; Chills [ ] ; Weakness [ y]  Cardiac: Chest  pain/pressure [ ] ; Resting SOB [ ] ; Exertional SOB [ y]; Orthopnea [ ] ; Pedal Edema [ ] ; Palpitations Blue.Reese ]; Syncope Blue.Reese ]; Presyncope Blue.Reese ]; Paroxysmal nocturnal dyspnea[ ]   Pulmonary: Cough [ ] ; Wheezing[ ] ; Hemoptysis[ ] ; Sputum [ ] ; Snoring Blue.Reese ]  GI: Vomiting[ ] ; Dysphagia[ ] ; Melena[ ] ; Hematochezia [ ] ; Heartburn[ ] ; Abdominal pain [ ] ; Constipation [ ] ; Diarrhea [ ] ; BRBPR [ ]   GU: Hematuria[ ] ; Dysuria [ ] ; Nocturia[ ]   Vascular: Pain in legs with walking [ ] ; Pain in feet with lying flat [ ] ; Non-healing sores [ ] ; Stroke [ ] ; TIA [ ] ; Slurred speech [ ] ;  Neuro: Headaches[y ]; Vertigo[ ] ; Seizures[ ] ; Paresthesias[ ] ;Blurred vision [ ] ; Diplopia [ ] ; Vision changes [ ]   Ortho/Skin: Arthritis Blue.Reese ]; Joint pain Blue.Reese ]; Muscle pain [ ] ; Joint swelling [ ] ; Back Pain [ ] ; Rash [ ]   Psych: Depression[ ] ; Anxiety[ ]   Heme: Bleeding problems [ ] ; Clotting disorders [ ] ; Anemia [ ]   Endocrine: Diabetes [ ] ; Thyroid dysfunction[ ]    Past Medical History:  Diagnosis Date  . Allergic rhinitis   . CHF (congestive heart failure) ([place])   . Common migraine   . Hypertension   . Mitral valve prolapse   . OCD (obsessive compulsive disorder)   . Osteoarthritis of thumb   . Recurrent cold sores     Current Outpatient Medications  Medication Sig Dispense Refill  . aspirin EC 81 MG EC  tablet Take 1 tablet (81 mg total) by mouth daily. Swallow whole. 90 tablet 1  . atorvastatin (LIPITOR) 40 MG tablet Take 1 tablet (40 mg total) by mouth daily. 30 tablet 5  . carvedilol (COREG) 3.125 MG tablet Take 1 tablet (3.125 mg total) by mouth 2 (two) times daily. 60 tablet 3  . Cholecalciferol (VITAMIN D) 125 MCG (5000 UT) CAPS Take 5,000 Units by mouth daily.    . famotidine (PEPCID) 40 MG tablet Take 40 mg by mouth 2 (two) times daily as needed for heartburn or indigestion.    Marland Kitchen FLUoxetine (PROZAC) 40 MG capsule Take 80 mg by mouth daily.    Marland Kitchen loratadine (CLARITIN) 10 MG tablet Take 10 mg by mouth daily as  needed for allergies.    Marland Kitchen NALTREXONE HCL PO Take 4.5 mg by mouth daily as needed (lower back pain).     . sacubitril-valsartan (ENTRESTO) 49-51 MG Take 1 tablet by mouth 2 (two) times daily. 60 tablet 5  . spironolactone (ALDACTONE) 25 MG tablet Take 0.5 tablets (12.5 mg total) by mouth daily. 15 tablet 6  . SUMAtriptan (IMITREX) 100 MG tablet Take 100 mg by mouth every 2 (two) hours as needed for migraine or headache. May repeat in 2 hours if headache persists or recurs.     No current facility-administered medications for this encounter.    Allergies  Allergen Reactions  . Penicillins Rash    had reaction as a child      Social History   Socioeconomic History  . Marital status: Married    Spouse name: Not on file  . Number of children: Not on file  . Years of education: Not on file  . Highest education level: Not on file  Occupational History  . Not on file  Tobacco Use  . Smoking status: Never Smoker  . Smokeless tobacco: Never Used  Vaping Use  . Vaping Use: Never used  Substance and Sexual Activity  . Alcohol use: No    Alcohol/week: 0.0 standard drinks  . Drug use: No  . Sexual activity: Not on file  Other Topics Concern  . Not on file  Social History Narrative  . Not on file   Social Determinants of Health   Financial Resource Strain: Not on file  Food Insecurity: Not on file  Transportation Needs: Not on file  Physical Activity: Not on file  Stress: Not on file  Social Connections: Not on file  Intimate Partner Violence: Not on file      Family History  Problem Relation Age of Onset  . Heart disease Paternal Grandfather   . Thyroid disease Father     Vitals:   08/11/20 1129  BP: 110/68  Pulse: 70  SpO2: 98%  Weight: 59.8 kg (131 lb 12.8 oz)    PHYSICAL EXAM: General:  Well appearing. No respiratory difficulty HEENT: normal Neck: supple. no JVD. Carotids 2+ bilat; no bruits. No lymphadenopathy or thryomegaly appreciated. Cor: PMI  nondisplaced. Regular rate & rhythm. No rubs, gallops or murmurs. Lungs: clear Abdomen: soft, nontender, nondistended. No hepatosplenomegaly. No bruits or masses. Good bowel sounds. Extremities: no cyanosis, clubbing, rash, edema Neuro: alert & oriented x 3, cranial nerves grossly intact. moves all 4 extremities w/o difficulty. Affect pleasant.  ECG 04/23/20 NSR 78 LBBB 182ms with variable high lateral q waves of unclear significance  Personally reviewed   ASSESSMENT & PLAN:  1. Chronic systolic HF due to NICM - Cath 8/21 normal cors - Echo 8/21 EF  35-40%. - cardiac MRI 03/2020 with LVEF 57% and no evidence for scar or infiltrative disease.  - Echo 11/21 in setting of TIA/atypical migraine EF 35-40%, grade 1DD.  I reviewed personally and EF felt to be 40-45% with septal dyssynergy and inferior wall HK - Doing well NYHA II. Volume status ok - Continue Entresto 49/51 - Continue carvedilol 3.125 bid - Off spiro due to volume depletion   - I reviewed all studies in detail with her and her husband and suspect that she likely has LBBB cardiomyopathy despite her QRS being only in the 123ms range as she has marked dyssynchrony on ech. Suspect cMRI may be underwhelming due to the fact that her ECGs are so dynamic. I have reviewed clinical situation and ECGs with Dr. Caryl Comes and he ahs agreed to evaluate her.  - Will not titrate meds today given recent orthostasis - I have cleared her to start moderate exercise program  2. TIA/Atypical migraine - Brain MRI normal 11/21  3. HTN - controlled  Total time spent ~60 minutes. Over half that time spent discussing above.   Glori Bickers, MD  9:59 PM

## 2020-08-11 ENCOUNTER — Other Ambulatory Visit: Payer: Self-pay

## 2020-08-11 ENCOUNTER — Encounter (HOSPITAL_COMMUNITY): Payer: Self-pay

## 2020-08-11 ENCOUNTER — Ambulatory Visit (HOSPITAL_COMMUNITY)
Admission: RE | Admit: 2020-08-11 | Discharge: 2020-08-11 | Disposition: A | Discharge status: Home / Self Care | Payer: 59 | Source: Ambulatory Visit | Attending: Internal Medicine | Admitting: Internal Medicine

## 2020-08-11 ENCOUNTER — Encounter (HOSPITAL_COMMUNITY): Payer: Self-pay | Admitting: Internal Medicine

## 2020-08-11 VITALS — BP 110/68 | HR 70 | Wt 131.8 lb

## 2020-08-11 DIAGNOSIS — I5020 Unspecified systolic (congestive) heart failure: Secondary | ICD-10-CM

## 2020-08-11 DIAGNOSIS — R002 Palpitations: Secondary | ICD-10-CM

## 2020-08-11 DIAGNOSIS — I447 Left bundle-branch block, unspecified: Secondary | ICD-10-CM | POA: Diagnosis not present

## 2020-08-11 DIAGNOSIS — M5416 Radiculopathy, lumbar region: Secondary | ICD-10-CM | POA: Insufficient documentation

## 2020-08-11 DIAGNOSIS — I10 Essential (primary) hypertension: Secondary | ICD-10-CM

## 2020-08-11 DIAGNOSIS — G894 Chronic pain syndrome: Secondary | ICD-10-CM | POA: Insufficient documentation

## 2020-08-11 NOTE — Patient Instructions (Signed)
You have been referred to Dr Caryl Comes in the Electrophysiology office.  His office will give you a call to schedule an appointment.  No labs were done today  Your physician recommends that you schedule a follow-up appointment in: 6 months with Dr Haroldine Laws.  Our office will call you to schedule an appointment  Please call office at [phone] option 2 if you have any questions or concerns.   At the [facility], you and your health needs are our priority. As part of our continuing mission to provide you with exceptional heart care, we have created designated Provider Care Teams. These Care Teams include your primary Cardiologist (physician) and Advanced Practice Providers (APPs- Physician Assistants and Nurse Practitioners) who all work together to provide you with the care you need, when you need it.   You may see any of the following providers on your designated Care Team at your next follow up: Marland Kitchen Dr Glori Bickers . Dr Loralie Champagne . Dr Vickki Muff . Darrick Grinder, NP . Lyda Jester, [place] . Audry Riles, PharmD   Please be sure to bring in all your medications bottles to every appointment.

## 2020-08-11 NOTE — Progress Notes (Signed)
avs sent via my chart

## 2020-08-15 ENCOUNTER — Other Ambulatory Visit: Payer: Self-pay

## 2020-08-15 ENCOUNTER — Ambulatory Visit (INDEPENDENT_AMBULATORY_CARE_PROVIDER_SITE_OTHER): Payer: 59 | Admitting: Diagnostic Neuroimaging

## 2020-08-15 ENCOUNTER — Encounter: Payer: Self-pay | Admitting: Diagnostic Neuroimaging

## 2020-08-15 VITALS — BP 100/67 | HR 73 | Ht 61.5 in | Wt 130.0 lb

## 2020-08-15 DIAGNOSIS — G43109 Migraine with aura, not intractable, without status migrainosus: Secondary | ICD-10-CM

## 2020-08-15 DIAGNOSIS — G459 Transient cerebral ischemic attack, unspecified: Secondary | ICD-10-CM

## 2020-08-15 NOTE — Patient Instructions (Signed)
complicated migraine phenomenon (TIA less likely) - continue atorvastatin for now; numbers had already improved prior to statin initiation with lifestyle changes; recheck with PCP in summer 2022 and then could consider to come off - has come off aspirin; likely ok to stay off  CHF / LOW EF (35-40%) - per cardiology  migraine without aura - stop triptan (not effective); may use ibuprofen (which has worked); also consider nurtec in future

## 2020-08-15 NOTE — Progress Notes (Signed)
[facility]  PATIENT: Leslie Esparza DOB: [birth year]  REFERRING CLINICIAN: Loel Dubonnet, NP HISTORY FROM: patient  REASON FOR VISIT: new consult    HISTORICAL  CHIEF COMPLAINT:  Chief Complaint  Patient presents with  . New Patient (Initial Visit)    Possible migraine vs TIA. Pt states the day of the episode she woke up and felt "really weird". She called her husband and said she just didn't feel right. Her husband states she said she was aware but couldn't get her thoughts together to decide what she needed to do for the day. No weakness or vision trouble. No slurred speech, just brain fog.   Marland Kitchen Room 7    Here with husband     HISTORY OF PRESENT ILLNESS:   50 year old female here for evaluation of TIA versus migraine.  November 2021 patient woke up and had brain fog, confusion, did not feel right.  She went to the hospital for evaluation.  Had MRI of the brain which initially showed a punctate DWI hyperintensity in the left frontal region, but follow-up MRI was negative.  This is felt to be artifactual.  Symptoms lasted 12 to 24 hours and then resolved.  Stroke work-up was completed.  She was discharged on aspirin and Plavix for 3 weeks and then aspirin alone.  She was started on statin.  Patient had been diagnosed with congestive heart failure, of unknown etiology several months earlier.  She had been starting a new diet to lose weight and improve her health.  She started this diet in September 2021.  Patient has long history of headaches, right frontal with throbbing and tension sensation, neck pain, occasional nausea.  No visual symptoms.  She had been on sumatriptan in the past without relief.  Ibuprofen tends to work better.  Patient went to hospital in November 2020 when she did not have any significant headache.    REVIEW OF SYSTEMS: Full 14 system review of systems performed and negative with exception of: As per HPI.  ALLERGIES: Allergies   Allergen Reactions  . Penicillins Rash    had reaction as a child    HOME MEDICATIONS: Outpatient Medications Prior to Visit  Medication Sig Dispense Refill  . atorvastatin (LIPITOR) 40 MG tablet Take 1 tablet (40 mg total) by mouth daily. 30 tablet 5  . carvedilol (COREG) 3.125 MG tablet Take 1 tablet (3.125 mg total) by mouth 2 (two) times daily. 60 tablet 3  . Cholecalciferol (VITAMIN D) 125 MCG (5000 UT) CAPS Take 5,000 Units by mouth daily.    . famotidine (PEPCID) 40 MG tablet Take 40 mg by mouth 2 (two) times daily as needed for heartburn or indigestion.    Marland Kitchen FLUoxetine (PROZAC) 40 MG capsule Take 80 mg by mouth daily.    Marland Kitchen loratadine (CLARITIN) 10 MG tablet Take 10 mg by mouth daily as needed for allergies.    Marland Kitchen NALTREXONE HCL PO Take 4.5 mg by mouth daily as needed (lower back pain).     . sacubitril-valsartan (ENTRESTO) 49-51 MG Take 1 tablet by mouth 2 (two) times daily. 60 tablet 5  . valACYclovir (VALTREX) 1000 MG tablet As needed for cold sores    . SUMAtriptan (IMITREX) 100 MG tablet Take 100 mg by mouth every 2 (two) hours as needed for migraine or headache. May repeat in 2 hours if headache persists or recurs.     No facility-administered medications prior to visit.    PAST MEDICAL HISTORY: Past Medical History:  Diagnosis Date  . Allergic rhinitis   . CHF (congestive heart failure) ([place])   . Common migraine   . Hypertension   . Mitral valve prolapse   . OCD (obsessive compulsive disorder)   . Osteoarthritis of thumb   . Recurrent cold sores     PAST SURGICAL HISTORY: Past Surgical History:  Procedure Laterality Date  . CESAREAN SECTION  2000 and 2009   x2  . LEFT HEART CATH AND CORONARY ANGIOGRAPHY Left 02/11/2020   Procedure: LEFT HEART CATH AND CORONARY ANGIOGRAPHY;  Surgeon: Wellington Hampshire, MD;  Location: [facility];  Service: Cardiovascular;  Laterality: Left;  . WISDOM TOOTH EXTRACTION      FAMILY HISTORY: Family History  Problem  Relation Age of Onset  . Heart disease Paternal Grandfather   . Thyroid disease Father   . Migraines Mother   . Migraines Brother   . Stroke Neg Hx   . Transient ischemic attack Neg Hx     SOCIAL HISTORY: Social History   Socioeconomic History  . Marital status: Married    Spouse name: Not on file  . Number of children: 2  . Years of education: Not on file  . Highest education level: Not on file  Occupational History  . Not on file  Tobacco Use  . Smoking status: Never Smoker  . Smokeless tobacco: Never Used  Vaping Use  . Vaping Use: Never used  Substance and Sexual Activity  . Alcohol use: No    Alcohol/week: 0.0 standard drinks  . Drug use: No  . Sexual activity: Not on file  Other Topics Concern  . Not on file  Social History Narrative   Lives at home with husband and children   Right handed   Caffeine: none currently   Social Determinants of Health   Financial Resource Strain: Not on file  Food Insecurity: Not on file  Transportation Needs: Not on file  Physical Activity: Not on file  Stress: Not on file  Social Connections: Not on file  Intimate Partner Violence: Not on file     PHYSICAL EXAM  GENERAL EXAM/CONSTITUTIONAL: Vitals:  Vitals:   08/15/20 0950  BP: 100/67  Pulse: 73  Weight: 130 lb (59 kg)  Height: 5' 1.5" (1.562 m)   Body mass index is 24.17 kg/m. Wt Readings from Last 3 Encounters:  08/15/20 130 lb (59 kg)  08/11/20 131 lb 12.8 oz (59.8 kg)  06/02/20 138 lb 6.4 oz (62.8 kg)    Patient is in no distress; well developed, nourished and groomed; neck is supple  CARDIOVASCULAR:  Examination of carotid arteries is normal; no carotid bruits  Regular rate and rhythm, no murmurs  Examination of peripheral vascular system by observation and palpation is normal  EYES:  Ophthalmoscopic exam of optic discs and posterior segments is normal; no papilledema or hemorrhages  Hearing Screening   125Hz  250Hz  500Hz  1000Hz  2000Hz  3000Hz   4000Hz  6000Hz  8000Hz   Right ear:           Left ear:             Visual Acuity Screening   Right eye Left eye Both eyes  Without correction: 20/20-2 20/30 20/20  With correction:       MUSCULOSKELETAL:  Gait, strength, tone, movements noted in Neurologic exam below  NEUROLOGIC: MENTAL STATUS:  No flowsheet data found.  awake, alert, oriented to person, place and time  recent and remote memory intact  normal attention and concentration  language  fluent, comprehension intact, naming intact  fund of knowledge appropriate  CRANIAL NERVE:   2nd - no papilledema on fundoscopic exam  2nd, 3rd, 4th, 6th - pupils equal and reactive to light, visual fields full to confrontation, extraocular muscles intact, no nystagmus  5th - facial sensation symmetric  7th - facial strength symmetric  8th - hearing intact  9th - palate elevates symmetrically, uvula midline  11th - shoulder shrug symmetric  12th - tongue protrusion midline  MOTOR:   normal bulk and tone, full strength in the BUE, BLE  SENSORY:   normal and symmetric to light touch, temperature, vibration  COORDINATION:   finger-nose-finger, fine finger movements normal  REFLEXES:   deep tendon reflexes present and symmetric  GAIT/STATION:   narrow based gait; able to walk on toes, heels and tandem; romberg is negative     DIAGNOSTIC DATA (LABS, IMAGING, TESTING) - I reviewed patient records, labs, notes, testing and imaging myself where available.  Lab Results  Component Value Date   WBC 6.6 04/23/2020   HGB 13.3 04/23/2020   HCT 39.5 04/23/2020   MCV 86.8 04/23/2020   PLT 350 04/23/2020      Component Value Date/Time   NA 136 06/17/2020 1516   NA 138 03/06/2020 0857   K 4.3 06/17/2020 1516   CL 101 06/17/2020 1516   CO2 25 06/17/2020 1516   GLUCOSE 127 (H) 06/17/2020 1516   BUN 22 (H) 06/17/2020 1516   BUN 13 03/06/2020 0857   CREATININE 0.63 06/17/2020 1516   CALCIUM 9.5 06/17/2020  1516   PROT 7.0 04/23/2020 1020   ALBUMIN 4.1 04/23/2020 1020   AST 36 04/23/2020 1020   ALT 69 (H) 04/23/2020 1020   ALKPHOS 47 04/23/2020 1020   BILITOT 0.7 04/23/2020 1020   GFRNONAA >60 06/17/2020 1516   GFRAA 117 03/06/2020 0857   Lab Results  Component Value Date   CHOL 155 04/24/2020   HDL 43 04/24/2020   LDLCALC 96 04/24/2020   TRIG 78 04/24/2020   CHOLHDL 3.6 04/24/2020   Lab Results  Component Value Date   HGBA1C 5.3 04/24/2020   No results found for: [id number] Lab Results  Component Value Date   TSH 1.471 04/23/2020    04/23/20 MRI brain 1. Possible small cortical infarct in the high posterior left frontal cortex (series 5 and 6, image 34). No associated edema or mass effect. 2. Focal T2/FLAIR hyperintensity in the posterior left frontal subcortical white matter is compatible with gliosis surrounding a developmental venous anomaly given characteristic prominent draining vein on SWI in this region. 3. Pansinus mucosal thickening without air-fluid levels.  04/23/20 MRI brain  - Three plane thin slice DWI imaging appears normal. - The questioned punctate lesion in the left middle frontal gyrus does not persist.  04/23/20 CTA head / neck IMPRESSION: 1. Negative CT angiography of the neck and head. No atherosclerotic disease. No large or medium vessel occlusion. 2. Enlarged and heterogeneous thyroid gland with multiple nodules, unchanged since January 2019 and before. Largest nodule on the right measures 1.7 cm. Nodules previously evaluated with ultrasound and Biopsy.  04/24/20 TTE 1. Left ventricular ejection fraction, by estimation, is 35 to 40%. The  left ventricle has moderately decreased function. The left ventricle  demonstrates global hypokinesis. Left ventricular diastolic parameters are  consistent with Grade I diastolic  dysfunction (impaired relaxation).  2. Right ventricular systolic function is normal. The right ventricular  size is  normal. There is normal pulmonary artery systolic pressure.  The  estimated right ventricular systolic pressure is 95.2 mmHg.  3. The mitral valve is normal in structure. Mild mitral valve  regurgitation.    05/29/20 cardiac monitor  Normal sinus rhythm avg HR of 63 bpm.   5 patient triggered events noted, 1 associated with short run of SVT, other triggers not associated with significant arrhythmia.  1 run of Supraventricular Tachycardia/atrial tachycardia occurred lasting 14 beats with a max rate of 144 bpm (avg 125 bpm).  Junctional Rhythm was present.   Isolated SVEs were rare (<1.0%), SVE Couplets were rare (<1.0%), and SVE Triplets were rare (<1.0%). Isolated VEs were rare (<1.0%), VE Couplets were rare (<1.0%), and no VE Triplets were present.     ASSESSMENT AND PLAN  50 y.o. year old female here with:  Dx:  1. Complicated migraine   2. TIA (transient ischemic attack)       PLAN:  complicated migraine phenomenon (TIA less likely) - continue atorvastatin for now; numbers had already improved prior to statin initiation with lifestyle changes; recheck with PCP in summer 2022 and then could consider to come off - has come off aspirin; likely ok to stay off  CHF / LOW EF (35-40%) - per cardiology  migraine without aura - stop triptan (not effective); may use ibuprofen (which has worked); also consider nurtec in future  Return for return to PCP, pending if symptoms worsen or fail to improve.    Penni Bombard, MD 01/16/1323, 40:10 AM Certified in Neurology, Neurophysiology and Neuroimaging  [facility] [address] [place], [place] [address] [phone]

## 2020-09-01 ENCOUNTER — Other Ambulatory Visit: Payer: Self-pay | Admitting: *Deleted

## 2020-09-01 MED ORDER — ENTRESTO 49-51 MG PO TABS
1.0000 | ORAL_TABLET | Freq: Two times a day (BID) | ORAL | 0 refills | Status: DC
Start: 2020-09-01 — End: 2020-10-01

## 2020-09-18 ENCOUNTER — Other Ambulatory Visit: Payer: Self-pay

## 2020-09-18 ENCOUNTER — Ambulatory Visit (INDEPENDENT_AMBULATORY_CARE_PROVIDER_SITE_OTHER): Payer: 59 | Admitting: Internal Medicine

## 2020-09-18 ENCOUNTER — Encounter: Payer: Self-pay | Admitting: Internal Medicine

## 2020-09-18 VITALS — BP 100/74 | HR 61 | Ht 61.5 in | Wt 129.0 lb

## 2020-09-18 DIAGNOSIS — I10 Essential (primary) hypertension: Secondary | ICD-10-CM

## 2020-09-18 DIAGNOSIS — I428 Other cardiomyopathies: Secondary | ICD-10-CM

## 2020-09-18 DIAGNOSIS — I5020 Unspecified systolic (congestive) heart failure: Secondary | ICD-10-CM

## 2020-09-18 DIAGNOSIS — I447 Left bundle-branch block, unspecified: Secondary | ICD-10-CM | POA: Diagnosis not present

## 2020-09-18 NOTE — Patient Instructions (Signed)

## 2020-09-18 NOTE — Progress Notes (Signed)
ELECTROPHYSIOLOGY CONSULT NOTE  Patient ID: Leslie Esparza, MRN: [id number], DOB/AGE: Oct 26, 1970 50 y.o. Admit date: (Not on file) Date of Consult: 09/18/2020  Primary Physician: Leslie Bender, PA-C Primary Cardiologist: **Leslie Esparza is a 50 y.o. female who is being seen today for the evaluation of abnormal electrocardiogram in the setting of cardiomyopathy at the request of DB   HPI Leslie Esparza is a 50 y.o. female referred for consult regarding conduction system disease in the setting of cardiomyopathy.  She initially presented 7/21 with palpitations.  Event recorder demonstrated infrequent PACs and PVCs and nonsustained SVT.  She had also undergone parallel echocardiogram which demonstrated an EF of 35-40%.  Because of the unexplained cardiomyopathy she underwent catheterization that demonstrated no angiographic coronary disease and 2 months later underwent cMRI which demonstrated near normal LV function at 57% with no LGE.  Repeat echo a month later demonstrated an EF of 35-45%.  Started on Entresto and carvedilol.  No functional limitations  When seen by Dr. Reine Just, he noted that her QRS duration is widened modestly compared to 2017 with a pattern of an atypical left bundle branch block as noted in the table below  Neurological event 11/21 unclear as to whether atypical migraine or a TIA.  Initial MRI was apparently concerning for the latter but repeat MRI failed to demonstrate.  Started on statins and vitamin D for the former.  Has had significant problems with neck pain wondering whether is related to the statin  DATE TEST EF   8/21 Echo   35-40 %   8/21 LHC   % No Angiogra CAD  10/21 cMRI 57% LGE neg  11/21 Echo  35-45%              Date Cr K TChol LDL Hgb  8/21   259 165   1/22 0.63 4.3 155 96 13.3           Date PVCs  7/21 <1%  12/21 <1%   Date HR QRSd 1 L V6 EF  12/17   94 QR QR mono --  7/21 86 120 mono mono RS 35-40  10/21 70 128  mono mono RS 57  11/21 78 132 QR QR mono 40-45  2/22 72 130 QR QR mono    4/22 61 126 QR QR mono       Past Medical History:  Diagnosis Date  . Allergic rhinitis   . CHF (congestive heart failure) ([place])   . Common migraine   . Hypertension   . Mitral valve prolapse   . OCD (obsessive compulsive disorder)   . Osteoarthritis of thumb   . Recurrent cold sores       Surgical History:  Past Surgical History:  Procedure Laterality Date  . CESAREAN SECTION  2000 and 2009   x2  . LEFT HEART CATH AND CORONARY ANGIOGRAPHY Left 02/11/2020   Procedure: LEFT HEART CATH AND CORONARY ANGIOGRAPHY;  Surgeon: Wellington Hampshire, MD;  Location: [facility];  Service: Cardiovascular;  Laterality: Left;  . WISDOM TOOTH EXTRACTION       Home Meds: Current Meds  Medication Sig  . atorvastatin (LIPITOR) 40 MG tablet Take 1 tablet (40 mg total) by mouth daily.  . carvedilol (COREG) 3.125 MG tablet Take 1 tablet (3.125 mg total) by mouth 2 (two) times daily.  . Cholecalciferol (VITAMIN D) 125 MCG (5000 UT) CAPS Take 5,000 Units by mouth daily.  Marland Kitchen  famotidine (PEPCID) 40 MG tablet Take 40 mg by mouth 2 (two) times daily as needed for heartburn or indigestion.  Marland Kitchen FLUoxetine (PROZAC) 40 MG capsule Take 80 mg by mouth daily.  Marland Kitchen loratadine (CLARITIN) 10 MG tablet Take 10 mg by mouth daily as needed for allergies.  Marland Kitchen NALTREXONE HCL PO Take 4.5 mg by mouth daily as needed (lower back pain).   . sacubitril-valsartan (ENTRESTO) 49-51 MG Take 1 tablet by mouth 2 (two) times daily.  . valACYclovir (VALTREX) 1000 MG tablet As needed for cold sores    Allergies:  Allergies  Allergen Reactions  . Penicillins Rash    had reaction as a child    Social History   Socioeconomic History  . Marital status: Married    Spouse name: Not on file  . Number of children: 2  . Years of education: Not on file  . Highest education level: Not on file  Occupational History  . Not on file  Tobacco Use  .  Smoking status: Never Smoker  . Smokeless tobacco: Never Used  Vaping Use  . Vaping Use: Never used  Substance and Sexual Activity  . Alcohol use: No    Alcohol/week: 0.0 standard drinks  . Drug use: No  . Sexual activity: Not on file  Other Topics Concern  . Not on file  Social History Narrative   Lives at home with husband and children   Right handed   Caffeine: none currently   Social Determinants of Health   Financial Resource Strain: Not on file  Food Insecurity: Not on file  Transportation Needs: Not on file  Physical Activity: Not on file  Stress: Not on file  Social Connections: Not on file  Intimate Partner Violence: Not on file     Family History  Problem Relation Age of Onset  . Heart disease Paternal Grandfather   . Thyroid disease Father   . Migraines Mother   . Migraines Brother   . Stroke Neg Hx   . Transient ischemic attack Neg Hx      ROS:  Please see the history of present illness.     All other systems reviewed and negative.    Physical Exam:  Blood pressure 100/74, pulse 61, height 5' 1.5" (1.562 m), weight 129 lb (58.5 kg). General: Well developed, well nourished female in no acute distress. Head: Normocephalic, atraumatic, sclera non-icteric, no xanthomas, nares are without discharge. EENT: normal  Lymph Nodes:  none Neck: Negative for carotid bruits. JVD not elevated. Back:without scoliosis kyphosis Lungs: Clear bilaterally to auscultation without wheezes, rales, or rhonchi. Breathing is unlabored. Heart: RRR with S1 S2. No  murmur . No rubs, or gallops appreciated. Abdomen: Soft, non-tender, non-distended with normoactive bowel sounds. No hepatomegaly. No rebound/guarding. No obvious abdominal masses. Msk:  Strength and tone appear normal for age. Extremities: No clubbing or cyanosis. No + edema.  Distal pedal pulses are 2+ and equal bilaterally. Skin: Warm and Dry Neuro: Alert and oriented X 3. CN III-XII intact Grossly normal sensory and  motor function . Psych:  Responds to questions appropriately with a normal affect.      Labs: Cardiac Enzymes No results for input(s): CKTOTAL, CKMB, TROPONINI in the last 72 hours. CBC Lab Results  Component Value Date   WBC 6.6 04/23/2020   HGB 13.3 04/23/2020   HCT 39.5 04/23/2020   MCV 86.8 04/23/2020   PLT 350 04/23/2020   PROTIME: No results for input(s): LABPROT, INR in the last 72 hours.  Chemistry No results for input(s): NA, K, CL, CO2, BUN, CREATININE, CALCIUM, PROT, BILITOT, ALKPHOS, ALT, AST, GLUCOSE in the last 168 hours.  Invalid input(s): LABALBU Lipids Lab Results  Component Value Date   CHOL 155 04/24/2020   HDL 43 04/24/2020   LDLCALC 96 04/24/2020   TRIG 78 04/24/2020   BNP No results found for: PROBNP Thyroid Function Tests: No results for input(s): TSH, T4TOTAL, T3FREE, THYROIDAB in the last 72 hours.  Invalid input(s): FREET3 Miscellaneous Lab Results  Component Value Date   DDIMER 0.27 05/17/2016    Radiology/Studies:  No results found.  EKG: Sinus at 60 Intervals 18/12/43 with a QR in lead I and L   Assessment and Plan:  Cardiomyopathy-nonischemic  Electrocardiogram-abnormal with Q waves and variable QRS duration  Atypical migraine versus TIA  Hypercholesterolemia   The patient has a nonischemic cardiomyopathy of variable degree discrepancy between the MR and the echoes is curious and Dr. Haroldine Laws suggestion that it may be related to variable ventricular activation and dyssynchrony is intriguing.  It is notable that the activation sequence as suggested by QRS morphology as 2 distinct patterns, the first with a QR in the leads I and L and the second with a monophasic R wave in lead I and L both associated with a distinctly different morphology in lead V6.  These do not seem to be related to rate as outlined above.  I would suspect not withstanding that it is functional but I am not quite sure what the trigger is.  Reviewing the more  recent monitor, there are 2 distinct QRS morphologies recorded although transitions are not available for review  In any case, the QRS morphologies and the degree of QRS widening or not sufficiently abnormal to justify device intervention for the treatment of "left bundle branch block cardiomyopathy "but I do think that the hypothesis is as noted intriguing and requires ongoing surveillance of QRS duration and ejection fraction.  Would continue Entresto and carvedilol  She asked about her statins.  We reviewed her 10-year cardiovascular risk which based on the numbers in the fall associated with 8 interval 40 pound weight loss are associated with a risk of about 1% and hence from a cardiac point of view there is no indication for statins.  Given the discomfort which she thinks may be related to statins we will put it on hold for right now.  We did review the data related to vitamin D and statins for decrease frequency of migraines but as this was her initial event currently it is fair to say that the frequency of recurrent events could inform treatment decisions      Virl Axe

## 2020-10-01 ENCOUNTER — Other Ambulatory Visit: Payer: Self-pay | Admitting: *Deleted

## 2020-10-01 MED ORDER — ENTRESTO 49-51 MG PO TABS
1.0000 | ORAL_TABLET | Freq: Two times a day (BID) | ORAL | 3 refills | Status: DC
Start: 2020-10-01 — End: 2021-02-02

## 2020-10-02 ENCOUNTER — Ambulatory Visit (INDEPENDENT_AMBULATORY_CARE_PROVIDER_SITE_OTHER): Payer: 59

## 2020-10-02 ENCOUNTER — Other Ambulatory Visit: Payer: Self-pay

## 2020-10-02 DIAGNOSIS — I428 Other cardiomyopathies: Secondary | ICD-10-CM | POA: Diagnosis not present

## 2020-10-02 LAB — ECHOCARDIOGRAM COMPLETE
AR max vel: 2.01 cm2
AV Area VTI: 1.87 cm2
AV Area mean vel: 1.95 cm2
AV Mean grad: 3 mmHg
AV Peak grad: 5.4 mmHg
Ao pk vel: 1.16 m/s
Area-P 1/2: 2.87 cm2
Calc EF: 34.4 %
S' Lateral: 4.3 cm
Single Plane A2C EF: 40.9 %
Single Plane A4C EF: 30.1 %

## 2020-10-06 ENCOUNTER — Ambulatory Visit (INDEPENDENT_AMBULATORY_CARE_PROVIDER_SITE_OTHER): Payer: 59 | Admitting: Cardiology

## 2020-10-06 ENCOUNTER — Other Ambulatory Visit: Payer: Self-pay

## 2020-10-06 ENCOUNTER — Encounter: Payer: Self-pay | Admitting: Cardiology

## 2020-10-06 VITALS — BP 104/62 | HR 72 | Ht 61.5 in | Wt 131.0 lb

## 2020-10-06 DIAGNOSIS — I10 Essential (primary) hypertension: Secondary | ICD-10-CM

## 2020-10-06 DIAGNOSIS — I428 Other cardiomyopathies: Secondary | ICD-10-CM | POA: Diagnosis not present

## 2020-10-06 MED ORDER — METOPROLOL SUCCINATE ER 25 MG PO TB24: 25.0000 mg | ORAL_TABLET | Freq: Every day | ORAL | 5 refills | Status: DC

## 2020-10-06 MED ORDER — SPIRONOLACTONE 25 MG PO TABS: 12.5000 mg | ORAL_TABLET | Freq: Every day | ORAL | 30 refills | Status: DC

## 2020-10-06 MED ORDER — SPIRONOLACTONE 25 MG PO TABS
12.5000 mg | ORAL_TABLET | Freq: Every day | ORAL | 30 refills | Status: DC
Start: 2020-10-06 — End: 2021-01-29

## 2020-10-06 NOTE — Progress Notes (Signed)
Cardiology Office Note:    Date:  10/06/2020   ID:  Leslie Esparza, DOB 1970-06-19, MRN [id number]  PCP:  Cyndi Bender, PA-C  [facility] Cardiologist:  Kate Sable, MD  [place] Electrophysiologist:  None   Referring MD: Cyndi Bender, PA-C   Chief Complaint  Patient presents with  . Other    Follow up post ECHO. Patient c.o occ. chest pain. Meds reviewed verbally with patient.      History of Present Illness:    Leslie Esparza is a 50 y.o. female with a hx of migraine, hypertension, hyperlipidemia, nonischemic cardiomyopathy last EF 35 to 40% who presents for follow-up.    Patient being seen for nonischemic cardiomyopathy, previously on Aldactone which was stopped due to low blood pressures and some dizziness.  Repeat echocardiogram was ordered.  Currently tolerating Coreg 3.125 mg twice daily, Entresto 49-51 mg twice daily.  Denies edema.  She is being seen for nonischemic cardiomyopathy.  Cardiac MRI was ordered to evaluate any infiltrative cause for cardiomyopathy.  She presents for results and follow-up.  Denies any chest pain or shortness of breath.  Denies edema.  Compliant with Coreg and Entresto.  Has occasional chest discomfort not associated with exertion.  Likely noncardiac  Prior notes  She underwent left heart cath on 02/11/2020 with no evidence of CAD.  Echo on 02/01/2020 showed moderately reduced EF, 35 to 40%.  CMR 03/2020 EF 57% Echo 10/02/2020 EF 30 to 35%   Past Medical History:  Diagnosis Date  . Allergic rhinitis   . CHF (congestive heart failure) ([place])   . Common migraine   . Hypertension   . Mitral valve prolapse   . OCD (obsessive compulsive disorder)   . Osteoarthritis of thumb   . Recurrent cold sores     Past Surgical History:  Procedure Laterality Date  . CESAREAN SECTION  2000 and 2009   x2  . LEFT HEART CATH AND CORONARY ANGIOGRAPHY Left 02/11/2020   Procedure: LEFT HEART CATH AND CORONARY ANGIOGRAPHY;  Surgeon:  Wellington Hampshire, MD;  Location: [facility];  Service: Cardiovascular;  Laterality: Left;  . WISDOM TOOTH EXTRACTION      Current Medications: Current Meds  Medication Sig  . Cholecalciferol (VITAMIN D) 125 MCG (5000 UT) CAPS Take 5,000 Units by mouth daily.  . famotidine (PEPCID) 40 MG tablet Take 40 mg by mouth 2 (two) times daily as needed for heartburn or indigestion.  Marland Kitchen FLUoxetine (PROZAC) 40 MG capsule Take 80 mg by mouth daily.  Marland Kitchen loratadine (CLARITIN) 10 MG tablet Take 10 mg by mouth daily as needed for allergies.  . metoprolol succinate (TOPROL XL) 25 MG 24 hr tablet Take 1 tablet (25 mg total) by mouth daily.  Marland Kitchen NALTREXONE HCL PO Take 4.5 mg by mouth daily as needed (lower back pain).   . sacubitril-valsartan (ENTRESTO) 49-51 MG Take 1 tablet by mouth 2 (two) times daily.  . valACYclovir (VALTREX) 1000 MG tablet As needed for cold sores  . [DISCONTINUED] carvedilol (COREG) 3.125 MG tablet Take 1 tablet (3.125 mg total) by mouth 2 (two) times daily.  . [DISCONTINUED] spironolactone (ALDACTONE) 25 MG tablet Take 0.5 tablets (12.5 mg total) by mouth daily.     Allergies:   Penicillins   Social History   Socioeconomic History  . Marital status: Married    Spouse name: Not on file  . Number of children: 2  . Years of education: Not on file  . Highest education level:  Not on file  Occupational History  . Not on file  Tobacco Use  . Smoking status: Never Smoker  . Smokeless tobacco: Never Used  Vaping Use  . Vaping Use: Never used  Substance and Sexual Activity  . Alcohol use: No    Alcohol/week: 0.0 standard drinks  . Drug use: No  . Sexual activity: Not on file  Other Topics Concern  . Not on file  Social History Narrative   Lives at home with husband and children   Right handed   Caffeine: none currently   Social Determinants of Health   Financial Resource Strain: Not on file  Food Insecurity: Not on file  Transportation Needs: Not on file   Physical Activity: Not on file  Stress: Not on file  Social Connections: Not on file     Family History: The patient's family history includes Heart disease in her paternal grandfather; Migraines in her brother and mother; Thyroid disease in her father. There is no history of Stroke or Transient ischemic attack.  ROS:   Please see the history of present illness.     All other systems reviewed and are negative.  EKGs/Labs/Other Studies Reviewed:    The following studies were reviewed today:   EKG:  EKG is ordered today.  Shows normal sinus rhythm, possible old septal infarct  Recent Labs: 04/23/2020: ALT 69; Hemoglobin 13.3; Platelets 350; TSH 1.471 06/17/2020: BUN 22; Creatinine, Ser 0.63; Potassium 4.3; Sodium 136  Recent Lipid Panel    Component Value Date/Time   CHOL 155 04/24/2020 0431   TRIG 78 04/24/2020 0431   HDL 43 04/24/2020 0431   CHOLHDL 3.6 04/24/2020 0431   VLDL 16 04/24/2020 0431   LDLCALC 96 04/24/2020 0431    Physical Exam:    VS:  BP 104/62 (BP Location: Left Arm, Patient Position: Sitting, Cuff Size: Normal)   Pulse 72   Ht 5' 1.5" (1.562 m)   Wt 131 lb (59.4 kg)   SpO2 99%   BMI 24.35 kg/m     Wt Readings from Last 3 Encounters:  10/06/20 131 lb (59.4 kg)  09/18/20 129 lb (58.5 kg)  08/15/20 130 lb (59 kg)     GEN:  Well nourished, well developed in no acute distress HEENT: Normal NECK: No JVD; No carotid bruits LYMPHATICS: No lymphadenopathy CARDIAC: RRR, no murmurs, rubs, gallops RESPIRATORY:  Clear to auscultation without rales, wheezing or rhonchi  ABDOMEN: Soft, non-tender, non-distended MUSCULOSKELETAL:  No edema; No deformity  SKIN: Warm and dry NEUROLOGIC:  Alert and oriented x 3 PSYCHIATRIC:  Normal affect   ASSESSMENT:    1. NICM (nonischemic cardiomyopathy) ([place])   2. Essential hypertension    PLAN:    In order of problems listed above:    1. Nonischemic cardiomyopathy,lhc 01/2020 no CAD.  Echo 02/01/2020, EF 35-40%.  Cardiac MRI 03/2020 showed EF has normalized with normal systolic function, LVEF 08%.  No evidence for scar or infiltrative disease.  Repeat echo 09/2020 EF 35%.  Stop Coreg, start Toprol-XL 25 mg daily, Entresto 49-51 mg twice daily, start Aldactone 12.5 mg nightly.  Patient is euvolemic.  Trying to give patient BP room to introduce Aldactone since her EF had previously normalized while taking Aldactone. 2. Hx of hypertension, BP controlled.  Continue Coreg and Entresto   Follow-up in 6 weeks  Total encounter time 40 minutes  Greater than 50% was spent in counseling and coordination of care with the patient   This note was generated in part  or whole with voice recognition software. Voice recognition is usually quite accurate but there are transcription errors that can and very often do occur. I apologize for any typographical errors that were not detected and corrected.  Medication Adjustments/Labs and Tests Ordered: Current medicines are reviewed at length with the patient today.  Concerns regarding medicines are outlined above.  Orders Placed This Encounter  Procedures  . EKG 12-Lead   Meds ordered this encounter  Medications  . metoprolol succinate (TOPROL XL) 25 MG 24 hr tablet    Sig: Take 1 tablet (25 mg total) by mouth daily.    Dispense:  30 tablet    Refill:  5  . DISCONTD: spironolactone (ALDACTONE) 25 MG tablet    Sig: Take 0.5 tablets (12.5 mg total) by mouth daily.    Dispense:  15 tablet    Refill:  30  . spironolactone (ALDACTONE) 25 MG tablet    Sig: Take 0.5 tablets (12.5 mg total) by mouth at bedtime.    Dispense:  15 tablet    Refill:  30    Patient Instructions  Medication Instructions:  Your physician has recommended you make the following change in your medication:   1.  STOP taking your Coreg (Carvedilol) 2.  START taking Toprol XL (Metoprolol Succinate) 25 MG once a day. 3.  START taking Aldactone (Spironolactone) 12.5 MG once daily at bedtime.  *If  you need a refill on your cardiac medications before your next appointment, please call your pharmacy*   Lab Work: None ordered If you have labs (blood work) drawn today and your tests are completely normal, you will receive your results only by: Marland Kitchen MyChart Message (if you have MyChart) OR . A paper copy in the mail If you have any lab test that is abnormal or we need to change your treatment, we will call you to review the results.   Testing/Procedures: None ordered   Follow-Up: At [facility], you and your health needs are our priority.  As part of our continuing mission to provide you with exceptional heart care, we have created designated Provider Care Teams.  These Care Teams include your primary Cardiologist (physician) and Advanced Practice Providers (APPs -  Physician Assistants and Nurse Practitioners) who all work together to provide you with the care you need, when you need it.  We recommend signing up for the patient portal called "MyChart".  Sign up information is provided on this After Visit Summary.  MyChart is used to connect with patients for Virtual Visits (Telemedicine).  Patients are able to view lab/test results, encounter notes, upcoming appointments, etc.  Non-urgent messages can be sent to your provider as well.   To learn more about what you can do with MyChart, go to [email or web address].    Your next appointment:   4-6 week(s)  The format for your next appointment:   In Person  Provider:   Kate Sable, MD   Other Instructions      Signed, Kate Sable, MD  10/06/2020 12:27 PM    [place]

## 2020-10-06 NOTE — Patient Instructions (Signed)
Medication Instructions:  Your physician has recommended you make the following change in your medication:   1.  STOP taking your Coreg (Carvedilol) 2.  START taking Toprol XL (Metoprolol Succinate) 25 MG once a day. 3.  START taking Aldactone (Spironolactone) 12.5 MG once daily at bedtime.  *If you need a refill on your cardiac medications before your next appointment, please call your pharmacy*   Lab Work: None ordered If you have labs (blood work) drawn today and your tests are completely normal, you will receive your results only by: Marland Kitchen MyChart Message (if you have MyChart) OR . A paper copy in the mail If you have any lab test that is abnormal or we need to change your treatment, we will call you to review the results.   Testing/Procedures: None ordered   Follow-Up: At [facility], you and your health needs are our priority.  As part of our continuing mission to provide you with exceptional heart care, we have created designated Provider Care Teams.  These Care Teams include your primary Cardiologist (physician) and Advanced Practice Providers (APPs -  Physician Assistants and Nurse Practitioners) who all work together to provide you with the care you need, when you need it.  We recommend signing up for the patient portal called "MyChart".  Sign up information is provided on this After Visit Summary.  MyChart is used to connect with patients for Virtual Visits (Telemedicine).  Patients are able to view lab/test results, encounter notes, upcoming appointments, etc.  Non-urgent messages can be sent to your provider as well.   To learn more about what you can do with MyChart, go to [email or web address].    Your next appointment:   4-6 week(s)  The format for your next appointment:   In Person  Provider:   Kate Sable, MD   Other Instructions

## 2020-11-04 DIAGNOSIS — Z8616 Personal history of COVID-19: Secondary | ICD-10-CM

## 2020-11-04 HISTORY — DX: Personal history of COVID-19: Z86.16

## 2020-11-06 ENCOUNTER — Ambulatory Visit: Payer: 59 | Admitting: Cardiology

## 2020-11-14 ENCOUNTER — Ambulatory Visit: Payer: 59 | Admitting: Medical

## 2020-12-01 ENCOUNTER — Other Ambulatory Visit: Payer: Self-pay

## 2020-12-01 ENCOUNTER — Encounter: Payer: Self-pay | Admitting: Cardiology

## 2020-12-01 ENCOUNTER — Ambulatory Visit (INDEPENDENT_AMBULATORY_CARE_PROVIDER_SITE_OTHER): Payer: 59 | Admitting: Cardiology

## 2020-12-01 VITALS — BP 100/62 | HR 67 | Ht 61.5 in | Wt 131.0 lb

## 2020-12-01 DIAGNOSIS — I428 Other cardiomyopathies: Secondary | ICD-10-CM

## 2020-12-01 DIAGNOSIS — I10 Essential (primary) hypertension: Secondary | ICD-10-CM | POA: Diagnosis not present

## 2020-12-01 NOTE — Patient Instructions (Signed)
Medication Instructions:   Your physician recommends that you continue on your current medications as directed. Please refer to the Current Medication list given to you today.  *If you need a refill on your cardiac medications before your next appointment, please call your pharmacy*   Lab Work:  None Ordered  If you have labs (blood work) drawn today and your tests are completely normal, you will receive your results only by: [place] (if you have MyChart) OR A paper copy in the mail If you have any lab test that is abnormal or we need to change your treatment, we will call you to review the results.   Testing/Procedures:  Echocardiogram - In 3 months  Please return to [facility] on ______________ at _______________ AM/PM for an Echocardiogram. Your physician has requested that you have an echocardiogram. Echocardiography is a painless test that uses sound waves to create images of your heart. It provides your doctor with information about the size and shape of your heart and how well your heart's chambers and valves are working. This procedure takes approximately one hour. There are no restrictions for this procedure. Please note; depending on visual quality an IV may need to be placed.     Follow-Up: At [facility], you and your health needs are our priority.  As part of our continuing mission to provide you with exceptional heart care, we have created designated Provider Care Teams.  These Care Teams include your primary Cardiologist (physician) and Advanced Practice Providers (APPs -  Physician Assistants and Nurse Practitioners) who all work together to provide you with the care you need, when you need it.  We recommend signing up for the patient portal called "MyChart".  Sign up information is provided on this After Visit Summary.  MyChart is used to connect with patients for Virtual Visits (Telemedicine).  Patients are able to view lab/test results,  encounter notes, upcoming appointments, etc.  Non-urgent messages can be sent to your provider as well.   To learn more about what you can do with MyChart, go to [email or web address].    Your next appointment:    After Echocardiogram  The format for your next appointment:   In Person  Provider:   You may see Kate Sable, MD or one of the following Advanced Practice Providers on your designated Care Team:   Murray Hodgkins, NP Christell Faith, PA-C Marrianne Mood, PA-C Cadence [place], [place] Laurann Montana, NP

## 2020-12-01 NOTE — Progress Notes (Signed)
Cardiology Office Note:    Date:  12/01/2020   ID:  Leslie Esparza, DOB 12-Dec-1970, MRN [id number]  PCP:  Cyndi Bender, PA-C  [facility] Cardiologist:  Kate Sable, MD  [place] Electrophysiologist:  None   Referring MD: Cyndi Bender, PA-C   Chief Complaint  Patient presents with   Other    4-6 week follow up. Meds reviewed verbally with patient.      History of Present Illness:    Leslie Esparza is a 50 y.o. female with a hx of migraine, hypertension, hyperlipidemia, nonischemic cardiomyopathy last EF 35 to 40% who presents for follow-up.    Patient being seen for nonischemic cardiomyopathy, medications for heart failure are being titrated.  Was started on Aldactone after last visit.  Denies any symptoms of edema, shortness of breath.   Prior notes  She underwent left heart cath on 02/11/2020 with no evidence of CAD.  Echo on 02/01/2020 showed moderately reduced EF, 35 to 40%.  CMR 03/2020 EF 57% Echo 10/02/2020 EF 30 to 35%   Past Medical History:  Diagnosis Date   Allergic rhinitis    CHF (congestive heart failure) ([facility])    Common migraine    Hypertension    Mitral valve prolapse    OCD (obsessive compulsive disorder)    Osteoarthritis of thumb    Recurrent cold sores     Past Surgical History:  Procedure Laterality Date   CESAREAN SECTION  2000 and 2009   x2   LEFT HEART CATH AND CORONARY ANGIOGRAPHY Left 02/11/2020   Procedure: LEFT HEART CATH AND CORONARY ANGIOGRAPHY;  Surgeon: Wellington Hampshire, MD;  Location: [facility];  Service: Cardiovascular;  Laterality: Left;   WISDOM TOOTH EXTRACTION      Current Medications: Current Meds  Medication Sig   Cholecalciferol (VITAMIN D) 125 MCG (5000 UT) CAPS Take 5,000 Units by mouth daily.   famotidine (PEPCID) 40 MG tablet Take 40 mg by mouth 2 (two) times daily as needed for heartburn or indigestion.   FLUoxetine (PROZAC) 40 MG capsule Take 80 mg by mouth daily.   loratadine  (CLARITIN) 10 MG tablet Take 10 mg by mouth daily as needed for allergies.   metoprolol succinate (TOPROL XL) 25 MG 24 hr tablet Take 1 tablet (25 mg total) by mouth daily.   NALTREXONE HCL PO Take 4.5 mg by mouth daily as needed (lower back pain).    sacubitril-valsartan (ENTRESTO) 49-51 MG Take 1 tablet by mouth 2 (two) times daily.   spironolactone (ALDACTONE) 25 MG tablet Take 0.5 tablets (12.5 mg total) by mouth at bedtime.   valACYclovir (VALTREX) 1000 MG tablet As needed for cold sores     Allergies:   Penicillins   Social History   Socioeconomic History   Marital status: Married    Spouse name: Not on file   Number of children: 2   Years of education: Not on file   Highest education level: Not on file  Occupational History   Not on file  Tobacco Use   Smoking status: Never   Smokeless tobacco: Never  Vaping Use   Vaping Use: Never used  Substance and Sexual Activity   Alcohol use: No    Alcohol/week: 0.0 standard drinks   Drug use: No   Sexual activity: Not on file  Other Topics Concern   Not on file  Social History Narrative   Lives at home with husband and children   Right handed   Caffeine: none  currently   Social Determinants of Health   Financial Resource Strain: Not on file  Food Insecurity: Not on file  Transportation Needs: Not on file  Physical Activity: Not on file  Stress: Not on file  Social Connections: Not on file     Family History: The patient's family history includes Heart disease in her paternal grandfather; Migraines in her brother and mother; Thyroid disease in her father. There is no history of Stroke or Transient ischemic attack.  ROS:   Please see the history of present illness.     All other systems reviewed and are negative.  EKGs/Labs/Other Studies Reviewed:    The following studies were reviewed today:   EKG:  EKG is ordered today.  Shows normal sinus rhythm, possible old anterior infarct  Recent Labs: 04/23/2020: ALT  69; Hemoglobin 13.3; Platelets 350; TSH 1.471 06/17/2020: BUN 22; Creatinine, Ser 0.63; Potassium 4.3; Sodium 136  Recent Lipid Panel    Component Value Date/Time   CHOL 155 04/24/2020 0431   TRIG 78 04/24/2020 0431   HDL 43 04/24/2020 0431   CHOLHDL 3.6 04/24/2020 0431   VLDL 16 04/24/2020 0431   LDLCALC 96 04/24/2020 0431    Physical Exam:    VS:  BP 100/62 (BP Location: Left Arm, Patient Position: Sitting, Cuff Size: Normal)   Pulse 67   Ht 5' 1.5" (1.562 m)   Wt 131 lb (59.4 kg)   SpO2 98%   BMI 24.35 kg/m     Wt Readings from Last 3 Encounters:  12/01/20 131 lb (59.4 kg)  10/06/20 131 lb (59.4 kg)  09/18/20 129 lb (58.5 kg)     GEN:  Well nourished, well developed in no acute distress HEENT: Normal NECK: No JVD; No carotid bruits LYMPHATICS: No lymphadenopathy CARDIAC: RRR, no murmurs, rubs, gallops RESPIRATORY:  Clear to auscultation without rales, wheezing or rhonchi  ABDOMEN: Soft, non-tender, non-distended MUSCULOSKELETAL:  No edema; No deformity  SKIN: Warm and dry NEUROLOGIC:  Alert and oriented x 3 PSYCHIATRIC:  Normal affect   ASSESSMENT:    1. NICM (nonischemic cardiomyopathy) ([place])   2. Primary hypertension     PLAN:    In order of problems listed above:    Nonischemic cardiomyopathy,lhc 01/2020 no CAD.  Echo 02/01/2020, EF 35-40%. Cardiac MRI 03/2020 showed EF has normalized with normal systolic function, LVEF 54%.  No evidence for scar or infiltrative disease.  Repeat echo 09/2020 EF 35%.  Cont Toprol-XL 25 mg daily, Entresto 49-51 mg twice daily, Aldactone 12.5 mg nightly.  Patient is euvolemic.  Hx of hypertension, BP controlled.  Continue Toprol-XL, Aldactone, and Entresto.   Follow-up in 6 weeks  Total encounter time 40 minutes  Greater than 50% was spent in counseling and coordination of care with the patient   This note was generated in part or whole with voice recognition software. Voice recognition is usually quite accurate but there  are transcription errors that can and very often do occur. I apologize for any typographical errors that were not detected and corrected.  Medication Adjustments/Labs and Tests Ordered: Current medicines are reviewed at length with the patient today.  Concerns regarding medicines are outlined above.  Orders Placed This Encounter  Procedures   EKG 12-Lead   ECHOCARDIOGRAM COMPLETE    No orders of the defined types were placed in this encounter.   Patient Instructions  Medication Instructions:   Your physician recommends that you continue on your current medications as directed. Please refer to the Current Medication  list given to you today.  *If you need a refill on your cardiac medications before your next appointment, please call your pharmacy*   Lab Work:  None Ordered  If you have labs (blood work) drawn today and your tests are completely normal, you will receive your results only by: [place] (if you have MyChart) OR A paper copy in the mail If you have any lab test that is abnormal or we need to change your treatment, we will call you to review the results.   Testing/Procedures:  Echocardiogram - In 3 months  [facility] return to [facility] on ______________ at _______________ AM/PM for an Echocardiogram. Your physician has requested that you have an echocardiogram. Echocardiography is a painless test that uses sound waves to create images of your heart. It provides your doctor with information about the size and shape of your heart and how well your heart's chambers and valves are working. This procedure takes approximately one hour. There are no restrictions for this procedure. Please note; depending on visual quality an IV may need to be placed.     Follow-Up: At [facility], you and your health needs are our priority.  As part of our continuing mission to provide you with exceptional heart care, we have created designated Provider Care Teams.   These Care Teams include your primary Cardiologist (physician) and Advanced Practice Providers (APPs -  Physician Assistants and Nurse Practitioners) who all work together to provide you with the care you need, when you need it.  We recommend signing up for the patient portal called "MyChart".  Sign up information is provided on this After Visit Summary.  MyChart is used to connect with patients for Virtual Visits (Telemedicine).  Patients are able to view lab/test results, encounter notes, upcoming appointments, etc.  Non-urgent messages can be sent to your provider as well.   To learn more about what you can do with MyChart, go to [email or web address].    Your next appointment:    After Echocardiogram  The format for your next appointment:   In Person  Provider:   You may see Kate Sable, MD or one of the following Advanced Practice Providers on your designated Care Team:   Murray Hodgkins, NP Christell Faith, PA-C Marrianne Mood, PA-C Cadence [place], [place] Laurann Montana, NP   Signed, Kate Sable, MD  12/01/2020 5:03 PM    [place]

## 2021-01-29 ENCOUNTER — Other Ambulatory Visit: Payer: Self-pay

## 2021-01-29 NOTE — Progress Notes (Signed)
Stopped Aldactone per patients MyChart and Dr. Thereasa Solo response.

## 2021-02-02 ENCOUNTER — Other Ambulatory Visit: Payer: Self-pay | Admitting: *Deleted

## 2021-02-02 MED ORDER — ENTRESTO 49-51 MG PO TABS
1.0000 | ORAL_TABLET | Freq: Two times a day (BID) | ORAL | 1 refills | Status: DC
Start: 2021-02-02 — End: 2021-04-06

## 2021-02-27 DIAGNOSIS — G5603 Carpal tunnel syndrome, bilateral upper limbs: Secondary | ICD-10-CM | POA: Insufficient documentation

## 2021-02-27 DIAGNOSIS — G5620 Lesion of ulnar nerve, unspecified upper limb: Secondary | ICD-10-CM | POA: Insufficient documentation

## 2021-03-03 ENCOUNTER — Ambulatory Visit (INDEPENDENT_AMBULATORY_CARE_PROVIDER_SITE_OTHER): Payer: 59

## 2021-03-03 ENCOUNTER — Other Ambulatory Visit: Payer: Self-pay

## 2021-03-03 DIAGNOSIS — I428 Other cardiomyopathies: Secondary | ICD-10-CM | POA: Diagnosis not present

## 2021-03-03 LAB — ECHOCARDIOGRAM COMPLETE
AR max vel: 2.05 cm2
AV Area VTI: 2.15 cm2
AV Area mean vel: 1.72 cm2
AV Mean grad: 4 mmHg
AV Peak grad: 7.4 mmHg
Ao pk vel: 1.36 m/s
Area-P 1/2: 3.5 cm2
Calc EF: 43.3 %
S' Lateral: 4 cm
Single Plane A2C EF: 43.4 %
Single Plane A4C EF: 43.3 %

## 2021-03-09 ENCOUNTER — Other Ambulatory Visit: Payer: Self-pay

## 2021-03-09 ENCOUNTER — Ambulatory Visit (INDEPENDENT_AMBULATORY_CARE_PROVIDER_SITE_OTHER): Payer: 59 | Admitting: Cardiology

## 2021-03-09 ENCOUNTER — Encounter: Payer: Self-pay | Admitting: Cardiology

## 2021-03-09 VITALS — BP 100/60 | HR 70 | Ht 61.5 in | Wt 135.0 lb

## 2021-03-09 DIAGNOSIS — I10 Essential (primary) hypertension: Secondary | ICD-10-CM

## 2021-03-09 DIAGNOSIS — Z01810 Encounter for preprocedural cardiovascular examination: Secondary | ICD-10-CM | POA: Diagnosis not present

## 2021-03-09 DIAGNOSIS — I428 Other cardiomyopathies: Secondary | ICD-10-CM | POA: Diagnosis not present

## 2021-03-09 DIAGNOSIS — Z01818 Encounter for other preprocedural examination: Secondary | ICD-10-CM

## 2021-03-09 NOTE — Progress Notes (Signed)
Cardiology Office Note:    Date:  03/09/2021   ID:  JA PISTOLE, DOB 03-Oct-1970, MRN [id number]  PCP:  Cyndi Bender, PA-C  [facility] Cardiologist:  Kate Sable, MD  [facility] Electrophysiologist:  None   Referring MD: Cyndi Bender, PA-C   Chief Complaint  Patient presents with   Other    Follow up post ECHO. Meds reviewed verbally with patient.      History of Present Illness:    Leslie Esparza is a 50 y.o. female with a hx of NICM (initial EF 35%), hypertension, hyperlipidemia, who presents for follow-up.    Patient being seen for nonischemic cardiomyopathy, and medication titration.  Taking Toprol, Entresto as prescribed.  Previously placed on Aldactone, this was stopped due to dizziness, fatigue and low blood pressures.  She states feeling better since stopping Aldactone.  Denies edema.  Heart repeat echocardiogram performed, here for echo results.  Also has joint pains at the base of her thumbs.  Planning surgical intervention within the next several months.  Wanting to make sure it is okay for surgical procedure.   Prior notes Echo 02/2021 EF 45 to 50%  She underwent left heart cath on 02/11/2020 with no evidence of CAD.  Echo on 02/01/2020 showed moderately reduced EF, 35 to 40%.  CMR 03/2020 EF 57% Echo 10/02/2020 EF 30 to 35%   Past Medical History:  Diagnosis Date   Allergic rhinitis    CHF (congestive heart failure) (HCC)    Common migraine    Hypertension    Mitral valve prolapse    OCD (obsessive compulsive disorder)    Osteoarthritis of thumb    Recurrent cold sores     Past Surgical History:  Procedure Laterality Date   CESAREAN SECTION  2000 and 2009   x2   LEFT HEART CATH AND CORONARY ANGIOGRAPHY Left 02/11/2020   Procedure: LEFT HEART CATH AND CORONARY ANGIOGRAPHY;  Surgeon: Wellington Hampshire, MD;  Location: [facility];  Service: Cardiovascular;  Laterality: Left;   WISDOM TOOTH EXTRACTION      Current  Medications: Current Meds  Medication Sig   Cholecalciferol (VITAMIN D) 125 MCG (5000 UT) CAPS Take 5,000 Units by mouth daily.   famotidine (PEPCID) 40 MG tablet Take 40 mg by mouth 2 (two) times daily as needed for heartburn or indigestion.   FLUoxetine (PROZAC) 40 MG capsule Take 80 mg by mouth daily.   loratadine (CLARITIN) 10 MG tablet Take 10 mg by mouth daily as needed for allergies.   metoprolol succinate (TOPROL XL) 25 MG 24 hr tablet Take 1 tablet (25 mg total) by mouth daily.   NALTREXONE HCL PO Take 4.5 mg by mouth daily as needed (lower back pain).    sacubitril-valsartan (ENTRESTO) 49-51 MG Take 1 tablet by mouth 2 (two) times daily.   valACYclovir (VALTREX) 1000 MG tablet As needed for cold sores     Allergies:   Penicillins   Social History   Socioeconomic History   Marital status: Married    Spouse name: Not on file   Number of children: 2   Years of education: Not on file   Highest education level: Not on file  Occupational History   Not on file  Tobacco Use   Smoking status: Never   Smokeless tobacco: Never  Vaping Use   Vaping Use: Never used  Substance and Sexual Activity   Alcohol use: No    Alcohol/week: 0.0 standard drinks   Drug use: No  Sexual activity: Not on file  Other Topics Concern   Not on file  Social History Narrative   Lives at home with husband and children   Right handed   Caffeine: none currently   Social Determinants of Health   Financial Resource Strain: Not on file  Food Insecurity: Not on file  Transportation Needs: Not on file  Physical Activity: Not on file  Stress: Not on file  Social Connections: Not on file     Family History: The patient's family history includes Heart disease in her paternal grandfather; Migraines in her brother and mother; Thyroid disease in her father. There is no history of Stroke or Transient ischemic attack.  ROS:   Please see the history of present illness.     All other systems reviewed  and are negative.  EKGs/Labs/Other Studies Reviewed:    The following studies were reviewed today:   EKG:  EKG not ordered today.   Recent Labs: 04/23/2020: ALT 69; Hemoglobin 13.3; Platelets 350; TSH 1.471 06/17/2020: BUN 22; Creatinine, Ser 0.63; Potassium 4.3; Sodium 136  Recent Lipid Panel    Component Value Date/Time   CHOL 155 04/24/2020 0431   TRIG 78 04/24/2020 0431   HDL 43 04/24/2020 0431   CHOLHDL 3.6 04/24/2020 0431   VLDL 16 04/24/2020 0431   LDLCALC 96 04/24/2020 0431    Physical Exam:    VS:  BP 100/60 (BP Location: Left Arm, Patient Position: Sitting, Cuff Size: Normal)   Pulse 70   Ht 5' 1.5" (1.562 m)   Wt 135 lb (61.2 kg)   SpO2 99%   BMI 25.09 kg/m     Wt Readings from Last 3 Encounters:  03/09/21 135 lb (61.2 kg)  12/01/20 131 lb (59.4 kg)  10/06/20 131 lb (59.4 kg)     GEN:  Well nourished, well developed in no acute distress HEENT: Normal NECK: No JVD; No carotid bruits LYMPHATICS: No lymphadenopathy CARDIAC: RRR, no murmurs, rubs, gallops RESPIRATORY:  Clear to auscultation without rales, wheezing or rhonchi  ABDOMEN: Soft, non-tender, non-distended MUSCULOSKELETAL:  No edema; No deformity  SKIN: Warm and dry NEUROLOGIC:  Alert and oriented x 3 PSYCHIATRIC:  Normal affect   ASSESSMENT:    1. NICM (nonischemic cardiomyopathy) ([place])   2. Primary hypertension   3. Pre-op evaluation      PLAN:    In order of problems listed above:    Nonischemic cardiomyopathy, initial EF 35%.  Repeat echo 03/03/2021 EF 45 to 50%.  Cardiac MRI 03/2020 showed EF 57%.  No evidence for scar or infiltrative disease. Cont Toprol-XL 25 mg daily, Entresto 49-51 mg twice daily.  Low blood pressures preventing addition of BP meds at this point.  Hx of hypertension, BP controlled.  Continue Toprol-XL and Entresto. Preop evaluation, hand surgery.  Okay to proceed with surgical procedure from a cardiac perspective.  Patient is medically optimized from a cardiac  perspective, last echocardiogram with mildly reduced EF 45 to 50%.  Prior left heart cath with no CAD.   Follow-up in 3 months  Total encounter time 40 minutes  Greater than 50% was spent in counseling and coordination of care with the patient   This note was generated in part or whole with voice recognition software. Voice recognition is usually quite accurate but there are transcription errors that can and very often do occur. I apologize for any typographical errors that were not detected and corrected.  Medication Adjustments/Labs and Tests Ordered: Current medicines are reviewed at length  with the patient today.  Concerns regarding medicines are outlined above.  No orders of the defined types were placed in this encounter.   No orders of the defined types were placed in this encounter.   Patient Instructions  Medication Instructions:  Your physician recommends that you continue on your current medications as directed. Please refer to the Current Medication list given to you today.  *If you need a refill on your cardiac medications before your next appointment, please call your pharmacy*   Lab Work: None ordered If you have labs (blood work) drawn today and your tests are completely normal, you will receive your results only by: [place] (if you have MyChart) OR A paper copy in the mail If you have any lab test that is abnormal or we need to change your treatment, we will call you to review the results.   Testing/Procedures: None ordered   Follow-Up: At [facility], you and your health needs are our priority.  As part of our continuing mission to provide you with exceptional heart care, we have created designated Provider Care Teams.  These Care Teams include your primary Cardiologist (physician) and Advanced Practice Providers (APPs -  Physician Assistants and Nurse Practitioners) who all work together to provide you with the care you need, when you need  it.  We recommend signing up for the patient portal called "MyChart".  Sign up information is provided on this After Visit Summary.  MyChart is used to connect with patients for Virtual Visits (Telemedicine).  Patients are able to view lab/test results, encounter notes, upcoming appointments, etc.  Non-urgent messages can be sent to your provider as well.   To learn more about what you can do with MyChart, go to [email or web address].    Your next appointment:   6 month(s)  The format for your next appointment:   In Person  Provider:   Kate Sable, MD   Other Instructions    Signed, Kate Sable, MD  03/09/2021 12:43 PM    [place]

## 2021-03-09 NOTE — Patient Instructions (Signed)

## 2021-04-01 ENCOUNTER — Other Ambulatory Visit: Payer: Self-pay | Admitting: *Deleted

## 2021-04-01 MED ORDER — METOPROLOL SUCCINATE ER 25 MG PO TB24
25.0000 mg | ORAL_TABLET | Freq: Every day | ORAL | 5 refills | Status: DC
Start: 2021-04-01 — End: 2021-07-17

## 2021-04-06 ENCOUNTER — Other Ambulatory Visit: Payer: Self-pay

## 2021-04-06 MED ORDER — ENTRESTO 49-51 MG PO TABS
1.0000 | ORAL_TABLET | Freq: Two times a day (BID) | ORAL | 3 refills | Status: DC
Start: 2021-04-06 — End: 2021-07-17

## 2021-04-30 DIAGNOSIS — I429 Cardiomyopathy, unspecified: Secondary | ICD-10-CM | POA: Insufficient documentation

## 2021-05-04 ENCOUNTER — Encounter: Payer: Self-pay | Admitting: Cardiology

## 2021-05-25 ENCOUNTER — Other Ambulatory Visit: Payer: Self-pay | Admitting: Obstetrics & Gynecology

## 2021-05-27 ENCOUNTER — Telehealth: Payer: Self-pay | Admitting: Cardiology

## 2021-05-27 NOTE — Telephone Encounter (Signed)
° °  Pre-operative Risk Assessment    Patient Name: Leslie Esparza  DOB: June 24, 1970 MRN: [id number]      Request for Surgical Clearance    Procedure:   Total Laparoscopic hysterectomy with bilateral salpingectomy   Date of Surgery:  Clearance 06/12/21                                 Surgeon:  Dr Sharlene Dory Group or Practice Name:  [facility] Phone number:  [phone] Fax number:  [phone]   Type of Clearance Requested:   - Medical    Type of Anesthesia:  Not Indicated   Additional requests/questions:    Manfred Arch   05/27/2021, 3:39 PM

## 2021-05-27 NOTE — Telephone Encounter (Signed)
° °  Name: Leslie Esparza  DOB: 05/03/1971  MRN: [id number]   Primary Cardiologist: Kate Sable, MD  Chart reviewed as part of pre-operative protocol coverage. Patient was contacted 05/27/2021 in reference to pre-operative risk assessment for pending surgery as outlined below.  Leslie Esparza was last seen 02/2021 by Dr. Garen Lah. History reviewed. Hx of NICM (normal cors 01/2020) with last echo 02/2021 with slightly improved EF 45-50% from prior. At that last visit she was cleared to proceed with unrelated surgery. RCRI 0.9% indicating low CV risk. I reached out to patient for update on how she is doing. The patient affirms she has been doing well without any new cardiac symptoms. Able to participate in ADLs, housework, walking >4 METS without angina or dyspnea. Therefore, based on ACC/AHA guidelines, the patient would be at acceptable risk for the planned procedure without further cardiovascular testing. The patient was advised that if she develops new symptoms prior to surgery to contact our office to arrange for a follow-up visit, and she verbalized understanding.  I will route this recommendation to the requesting party via Epic fax function and remove from pre-op pool. Please call with questions.  Charlie Pitter, PA-C 05/27/2021, 4:25 PM

## 2021-06-02 ENCOUNTER — Encounter (HOSPITAL_BASED_OUTPATIENT_CLINIC_OR_DEPARTMENT_OTHER): Payer: Self-pay | Admitting: Obstetrics & Gynecology

## 2021-06-03 ENCOUNTER — Encounter (HOSPITAL_BASED_OUTPATIENT_CLINIC_OR_DEPARTMENT_OTHER): Payer: Self-pay | Admitting: Obstetrics & Gynecology

## 2021-06-03 ENCOUNTER — Other Ambulatory Visit: Payer: Self-pay

## 2021-06-03 DIAGNOSIS — Z01812 Encounter for preprocedural laboratory examination: Secondary | ICD-10-CM | POA: Diagnosis not present

## 2021-06-03 DIAGNOSIS — I5020 Unspecified systolic (congestive) heart failure: Secondary | ICD-10-CM | POA: Diagnosis not present

## 2021-06-03 NOTE — Progress Notes (Signed)
Spoke w/ via phone for pre-op interview---pt Lab needs dos----urine preg               Lab results------lab appt 06-09-2021 [facility] bmp t &s  COVID test -----patient states asymptomatic no test needed Arrive at -------1145 am 06-12-2021 NPO after MN NO Solid Food.  Clear liquids from MN until---1045 Med rec completed Medications to take morning of surgery -----metoprolol succinate, fluoxetine, entresto Diabetic medication -----n/a Patient instructed no nail polish to be worn day of surgery Patient instructed to bring photo id and insurance card day of surgery Patient aware to have Driver (ride ) / caregiver    for 24 hours after surgery  husband jeremy Patient Special Instructions -----pt given extended recovery instructions Pre-Op special Istructions -----none Patient verbalized understanding of instructions that were given at this phone interview. Patient denies shortness of breath, chest pain, fever, cough at this phone interview.   Anesthesia: has cardiac clearance note dayna dunn pa 05-27-2021 chart/epic.hx of nicm, ef 40 to 45 % per last echo hx of chf htn, pt denies all cardiac symptoms or sob at pre op call.  PCP: [facility] Cardiologist :dr Aaron Edelman Ethelle Lyon 03-09-2021 epic Hear failure clinic lov 09-18-2020 dr Caryl Comes epic Chest x-ray : EKG :12-01-2020 chart/epic Echo :03-03-2021 epic Stress test:none Cardiac Cath : 02-11-2020 epic Activity level: does all activities without restrictions per pt Neurology lov dr Leta Baptist 08-15-2020 epic Sleep Study/ CPAP :none

## 2021-06-03 NOTE — Progress Notes (Signed)
PLEASE WEAR A MASK OUT IN PUBLIC AND SOCIAL DISTANCE AND [place] YOUR HANDS FREQUENTLY. PLEASE ASK ALL YOUR CLOSE HOUSEHOLD CONTACT TO WEAR MASK OUT IN PUBLIC AND SOCIAL DISTANCE AND [place] HANDS FREQUENTLY ALSO.      Your procedure is scheduled on 06-12-2021  Report to [facility]   Call this number if you have problems the morning of surgery  :[phone].   OUR ADDRESS IS [address].  WE ARE LOCATED IN [facility].  PLEASE BRING YOUR INSURANCE CARD AND PHOTO ID DAY OF SURGERY.  ONLY ONE PERSON ALLOWED IN FACILITY WAITING AREA.                                     REMEMBER:  DO NOT EAT FOOD, CANDY GUM OR MINTS  AFTER MIDNIGHT THE NIGHT BEFORE YOUR SURGERY . YOU MAY HAVE CLEAR LIQUIDS FROM MIDNIGHT THE NIGHT BEFORE YOUR SURGERY UNTIL 1045 AM. NO CLEAR LIQUIDS AFTER 1045 AM DAY OF SURGERY.   YOU MAY  BRUSH YOUR TEETH MORNING OF SURGERY AND RINSE YOUR MOUTH OUT, NO CHEWING GUM CANDY OR MINTS.    CLEAR LIQUID DIET   Foods Allowed                                                                     Foods Excluded  Coffee and tea, regular and decaf                             liquids that you cannot  Plain Jell-O any favor except red or purple                                           see through such as: Fruit ices (not with fruit pulp)                                     milk, soups, orange juice  Iced Popsicles                                    All solid food Carbonated beverages, regular and diet                                    Cranberry, grape and apple juices Sports drinks like Gatorade Lightly seasoned clear broth or consume(fat free) Sugar  Sample Menu Breakfast                                Lunch  Supper Cranberry juice                                           Jell-O                                     Grape juice                           Apple juice Coffee or tea                         Jell-O                                      Popsicle                                                Coffee or tea                        Coffee or tea  _____________________________________________________________________     TAKE THESE MEDICATIONS MORNING OF SURGERY WITH A SIP OF WATER:  METOPROLOL SUCCINATE, FLUOXETINE, ENTRESTO.  ONE VISITOR IS ALLOWED IN WAITING ROOM ONLY DAY OF SURGERY.  YOU MAY HAVE ANOTHER PERSON SWITCH OUT WITH THE  1  VISITOR IN THE WAITING ROOM DAY OF SURGERY AND A MASK MUST BE WORN IN THE WAITING ROOM.    2 VISITORS  MAY VISIT IN THE EXTENDED RECOVERY ROOM UNTIL 800 PM ONLY 1 VISITOR AGE 52 AND OVER MAY SPEND THE NIGHT AND MUST BE IN EXTENDED RECOVERY ROOM NO LATER THAN 800 PM .    UP TO 2 CHILDREN AGE 52 TO 15 MAY ALSO VISIT IN EXTENDED RECOVERY ROOM ONLY UNTIL 800 PM AND MUST LEAVE BY 800 PM. ALL PERSONS VISITING IN EXTENDED RECOVERY ROOM MUST WEAR A MASK.                                    DO NOT WEAR JEWERLY, MAKE UP. DO NOT WEAR LOTIONS, POWDERS, PERFUMES OR NAIL POLISH ON YOUR FINGERNAILS. TOENAIL POLISH IS OK TO WEAR. DO NOT SHAVE FOR 48 HOURS PRIOR TO DAY OF SURGERY. MEN MAY SHAVE FACE AND NECK. CONTACTS, GLASSES, OR DENTURES MAY NOT BE WORN TO SURGERY.                                    [facility] IS NOT RESPONSIBLE  FOR ANY BELONGINGS.                                                                    [facility]  [facility] - Preparing for Surgery Before surgery, you can play an important role.  Because skin is not sterile, your skin needs to be as free of germs as possible.  You can reduce the number of germs on your skin by washing with CHG (chlorahexidine gluconate) soap before surgery.  CHG is an antiseptic cleaner which kills germs and bonds with the skin to continue killing germs even after washing. Please DO NOT use if you have an allergy to CHG or antibacterial soaps.  If your skin becomes reddened/irritated stop using the CHG and  inform your nurse when you arrive at Short Stay. Do not shave (including legs and underarms) for at least 48 hours prior to the first CHG shower.  You may shave your face/neck. Please follow these instructions carefully:  1.  Shower with CHG Soap the night before surgery and the  morning of Surgery.  2.  If you choose to wash your hair, wash your hair first as usual with your  normal  shampoo.  3.  After you shampoo, rinse your hair and body thoroughly to remove the  shampoo.                            4.  Use CHG as you would any other liquid soap.  You can apply chg directly  to the skin and wash                      Gently with a scrungie or clean washcloth.  5.  Apply the CHG Soap to your body ONLY FROM THE NECK DOWN.   Do not use on face/ open                           Wound or open sores. Avoid contact with eyes, ears mouth and genitals (private parts).                       Wash face,  Genitals (private parts) with your normal soap.             6.  Wash thoroughly, paying special attention to the area where your surgery  will be performed.  7.  Thoroughly rinse your body with warm water from the neck down.  8.  DO NOT shower/wash with your normal soap after using and rinsing off  the CHG Soap.                9.  Pat yourself dry with a clean towel.            10.  Wear clean pajamas.            11.  Place clean sheets on your bed the night of your first shower and do not  sleep with pets. Day of Surgery : Do not apply any lotions/deodorants the morning of surgery.  Please wear clean clothes to the hospital/surgery center.  IF YOU HAVE ANY SKIN IRRITATION OR PROBLEMS WITH THE SURGICAL SOAP, PLEASE GET A BAR OF GOLD DIAL SOAP AND SHOWER THE NIGHT BEFORE YOUR SURGERY AND THE MORNING OF YOUR SURGERY. PLEASE LET THE NURSE KNOW MORNING OF YOUR SURGERY IF YOU HAD ANY PROBLEMS WITH THE SURGICAL SOAP.  FAILURE TO FOLLOW THESE INSTRUCTIONS MAY RESULT IN THE CANCELLATION OF YOUR SURGERY PATIENT  SIGNATURE_________________________________  NURSE SIGNATURE__________________________________  ________________________________________________________________________  QUESTIONS CALL [facility] PRE OP NURSE PHONE [phone].

## 2021-06-09 ENCOUNTER — Other Ambulatory Visit: Payer: Self-pay

## 2021-06-09 ENCOUNTER — Encounter (HOSPITAL_COMMUNITY)
Admission: RE | Admit: 2021-06-09 | Discharge: 2021-06-09 | Disposition: A | Discharge status: Home / Self Care | Payer: 59 | Source: Ambulatory Visit | Attending: Obstetrics & Gynecology | Admitting: Obstetrics & Gynecology

## 2021-06-09 DIAGNOSIS — I5020 Unspecified systolic (congestive) heart failure: Secondary | ICD-10-CM | POA: Insufficient documentation

## 2021-06-09 DIAGNOSIS — Z01812 Encounter for preprocedural laboratory examination: Secondary | ICD-10-CM | POA: Insufficient documentation

## 2021-06-09 LAB — CBC
HCT: 38.4 % (ref 36.0–46.0)
Hemoglobin: 12.7 g/dL (ref 12.0–15.0)
MCH: 29.6 pg (ref 26.0–34.0)
MCHC: 33.1 g/dL (ref 30.0–36.0)
MCV: 89.5 fL (ref 80.0–100.0)
Platelets: 384 10*3/uL (ref 150–400)
RBC: 4.29 MIL/uL (ref 3.87–5.11)
RDW: 13.3 % (ref 11.5–15.5)
WBC: 6.9 10*3/uL (ref 4.0–10.5)
nRBC: 0 % (ref 0.0–0.2)

## 2021-06-09 LAB — BASIC METABOLIC PANEL
Anion gap: 4 — ABNORMAL LOW (ref 5–15)
BUN: 14 mg/dL (ref 6–20)
CO2: 24 mmol/L (ref 22–32)
Calcium: 8.4 mg/dL — ABNORMAL LOW (ref 8.9–10.3)
Chloride: 108 mmol/L (ref 98–111)
Creatinine, Ser: 0.55 mg/dL (ref 0.44–1.00)
GFR, Estimated: >60 mL/min (ref >60)
Glucose, Bld: 85 mg/dL (ref 70–99)
Potassium: 4.1 mmol/L (ref 3.5–5.1)
Sodium: 136 mmol/L (ref 135–145)

## 2021-06-11 NOTE — H&P (Addendum)
Leslie Esparza is an 50 y.o. female Para 2 with a history of chronic pelvic pain, fibroids, adenomyosis and ovarian cyst here for a total laparoscopic hysterectomy, bilateral salpingectomy and cystoscopy, possible ovarian cystectomy.  Patient has used over counter pain medication, muscle relaxants and narcotics without satisfactory management of her symptoms and now desires definitive therapy.    Pertinent Gynecological History: Menses:  Irregular  , 05/13/21 skips some months.  Bleeding: None currently Contraception: none DES exposure: unknown Blood transfusions: none Sexually transmitted diseases: no past history Previous GYN Procedures: DNC  Last mammogram: normal Date: 05/13/2021 Last pap: normal Date: 05/13/21 OB History: G2, P2002   Menstrual History: Menarche age: 43 Patient's last menstrual period was 02/12/2021 (approximate).    Past Medical History:  Diagnosis Date   Allergic rhinitis    Bilateral carpal tunnel syndrome    CHF (congestive heart failure) (HCC)    ef 45 to 50 % per last echo 03-03-2021   Common migraine    Complication of anesthesia    itching after c section   History of COVID-19 11/04/2020   x 3 days all symptoms resolved   Hypertension    Multiple thyroid nodules    monitored by pcp nathan conroy pa [facility], thryoid cysts also   Nonischemic cardiomyopathy ([place])    OCD (obsessive compulsive disorder)    Osteoarthritis of thumbs    Pelvic pain    Recurrent cold sores     Past Surgical History:  Procedure Laterality Date   CESAREAN SECTION     x2 2000 and 2004   LEFT HEART CATH AND CORONARY ANGIOGRAPHY Left 02/11/2020   Procedure: LEFT HEART CATH AND CORONARY ANGIOGRAPHY;  Surgeon: Wellington Hampshire, MD;  Location: [facility];  Service: Cardiovascular;  Laterality: Left;   WISDOM TOOTH EXTRACTION     yrs ago per pt on 06-03-2021    Family History  Problem Relation Age of Onset   Heart disease Paternal Grandfather     Thyroid disease Father    Migraines Mother    Migraines Brother    Stroke Neg Hx    Transient ischemic attack Neg Hx     Social History:  reports that she has never smoked. She has never used smokeless tobacco. She reports that she does not drink alcohol and does not use drugs.  Allergies:  Allergies  Allergen Reactions   Penicillins Rash    had reaction as a child    No current facility-administered medications for this encounter.  Current Outpatient Medications:    Cholecalciferol (VITAMIN D) 125 MCG (5000 UT) CAPS, Take 5,000 Units by mouth daily., Disp: , Rfl:    FLUoxetine (PROZAC) 40 MG capsule, Take 80 mg by mouth daily., Disp: , Rfl:    metoprolol succinate (TOPROL XL) 25 MG 24 hr tablet, Take 1 tablet (25 mg total) by mouth daily., Disp: 30 tablet, Rfl: 5   sacubitril-valsartan (ENTRESTO) 49-51 MG, Take 1 tablet by mouth 2 (two) times daily., Disp: 60 tablet, Rfl: 3   valACYclovir (VALTREX) 1000 MG tablet, As needed for cold sores, Disp: , Rfl:    Review of Systems Constitutional: Denies fevers/chills Cardiovascular: Denies chest pain or palpitations Pulmonary: Denies coughing or wheezing Gastrointestinal: Denies nausea, vomiting or diarrhea Genitourinary: With pelvic pain.  Denies unusual vaginal bleeding, unusual vaginal discharge, dysuria, urgency or frequency.  Musculoskeletal: Denies muscle or joint aches and pain.  Neurology: Denies abnormal sensations such as tingling or numbness.    Height 5' 1.5" (  1.562 m), weight 61.2 kg, last menstrual period 02/12/2021. Blood pressure 136/71, pulse 66, temperature 98.8 F (37.1 C), temperature source Oral, resp. rate 16, height 5' 1.5" (1.562 m), weight 61.1 kg, last menstrual period 06/06/2021, SpO2 100 %.  Physical Exam  Constitutional: She is oriented to person, place, and time. She appears well-developed and well-nourished.  HENT:  Head: Normocephalic and atraumatic.  Neck: Normal range of motion.  Cardiovascular:  Normal rate, regular rhythm and normal heart sounds.   Respiratory: Effort normal and breath sounds normal.  GI: Soft. Bowel sounds are normal.  Neurological: She is alert and oriented to person, place, and time.  Skin: Skin is warm and dry.  Psychiatric: She has a normal mood and affect. Her behavior is normal.    Recent Results (from the past 2160 hour(s))  Type and screen     Status: None   Collection Time: 06/09/21 12:58 PM  Result Value Ref Range   ABO/RH(D) O POS    Antibody Screen NEG    Sample Expiration 06/23/2021,2359    Extend sample reason      NO TRANSFUSIONS OR PREGNANCY IN THE PAST 3 MONTHS Performed at [facility], [place] [address], [place], [place] [address]   CBC     Status: None   Collection Time: 06/09/21  1:04 PM  Result Value Ref Range   WBC 6.9 4.0 - 10.5 K/uL   RBC 4.29 3.87 - 5.11 MIL/uL   Hemoglobin 12.7 12.0 - 15.0 g/dL   HCT 38.4 36.0 - 46.0 %   MCV 89.5 80.0 - 100.0 fL   MCH 29.6 26.0 - 34.0 pg   MCHC 33.1 30.0 - 36.0 g/dL   RDW 13.3 11.5 - 15.5 %   Platelets 384 150 - 400 K/uL   nRBC 0.0 0.0 - 0.2 %    Comment: Performed at [facility], [place] [address] [place], [place] [address]  Basic metabolic panel per protocol     Status: Abnormal   Collection Time: 06/09/21  1:04 PM  Result Value Ref Range   Sodium 136 135 - 145 mmol/L   Potassium 4.1 3.5 - 5.1 mmol/L   Chloride 108 98 - 111 mmol/L   CO2 24 22 - 32 mmol/L   Glucose, Bld 85 70 - 99 mg/dL    Comment: Glucose reference range applies only to samples taken after fasting for at least 8 hours.   BUN 14 6 - 20 mg/dL   Creatinine, Ser 0.55 0.44 - 1.00 mg/dL   Calcium 8.4 (L) 8.9 - 10.3 mg/dL   GFR, Estimated >60 >60 mL/min    Comment: (NOTE) Calculated using the CKD-EPI Creatinine Equation (2021)    Anion gap 4 (L) 5 - 15    Comment: Performed at [facility], [place] [address] [place], [place] [address]     06/11/2021: Pelvic  Ultrasound: Uterus 10.5 x 4.5 x 7.0 cm.  EMS 11.37 mm. Left ovary with 2.3 cm simple cyst.  Right ovary not seen.   06/12/21:  Result Notes Component Ref Range & Units 11:45 1 yr ago 10 yr ago  Preg Test, Ur NEGATIVE NEGATIVE         Assessment/Plan: 50 y/o Para 2 with a history of chronic pelvic pain, fibroids and adenomyosis here for a total laparoscopic hysterectomy, bilateral salpingectomy and cystoscopy, possible ovarian cystectomy,   - This procedure has been fully reviewed with the patient and written informed consent has been obtained.  We  discussed risks including but not limited to heavy bleeding, infection and damage to organs and possible need for additional procedures.    -She would like conservation of her ovaries if they are normal but removal of any abnormal lesions if present or removal of ovaries if deemed appropriate.     -She understands she may continue with pelvic pain after this procedure and may require additional procedures in the future.    -She understands there are other conservative management options for her symptoms but she does not desire to pursue them, she desires a hysterectomy.    -We discussed that hysterectomy leads to sterilization.  Patient states understands all these points and she states she does not desire any more pregnancies. - Admit to [facility].  - NPO and IV fluids.      Archie Endo 06/11/2021, 7:47 PM

## 2021-06-12 ENCOUNTER — Ambulatory Visit (HOSPITAL_BASED_OUTPATIENT_CLINIC_OR_DEPARTMENT_OTHER): Payer: 59 | Admitting: Anesthesiology

## 2021-06-12 ENCOUNTER — Ambulatory Visit (HOSPITAL_BASED_OUTPATIENT_CLINIC_OR_DEPARTMENT_OTHER)
Admission: RE | Admit: 2021-06-12 | Discharge: 2021-06-13 | Disposition: A | Discharge status: Home / Self Care | Payer: 59 | Attending: Obstetrics & Gynecology | Admitting: Obstetrics & Gynecology

## 2021-06-12 ENCOUNTER — Other Ambulatory Visit: Payer: Self-pay

## 2021-06-12 ENCOUNTER — Encounter (HOSPITAL_BASED_OUTPATIENT_CLINIC_OR_DEPARTMENT_OTHER)
Admission: RE | Disposition: A | Discharge status: Home / Self Care | Payer: Self-pay | Source: Home / Self Care | Attending: Obstetrics & Gynecology

## 2021-06-12 ENCOUNTER — Encounter (HOSPITAL_BASED_OUTPATIENT_CLINIC_OR_DEPARTMENT_OTHER): Payer: Self-pay | Admitting: Obstetrics & Gynecology

## 2021-06-12 DIAGNOSIS — Z90710 Acquired absence of both cervix and uterus: Secondary | ICD-10-CM | POA: Diagnosis present

## 2021-06-12 DIAGNOSIS — I509 Heart failure, unspecified: Secondary | ICD-10-CM | POA: Insufficient documentation

## 2021-06-12 DIAGNOSIS — F419 Anxiety disorder, unspecified: Secondary | ICD-10-CM | POA: Diagnosis not present

## 2021-06-12 DIAGNOSIS — G8929 Other chronic pain: Secondary | ICD-10-CM | POA: Insufficient documentation

## 2021-06-12 DIAGNOSIS — I428 Other cardiomyopathies: Secondary | ICD-10-CM | POA: Insufficient documentation

## 2021-06-12 DIAGNOSIS — D259 Leiomyoma of uterus, unspecified: Secondary | ICD-10-CM | POA: Insufficient documentation

## 2021-06-12 DIAGNOSIS — R102 Pelvic and perineal pain: Secondary | ICD-10-CM | POA: Diagnosis present

## 2021-06-12 DIAGNOSIS — F32A Depression, unspecified: Secondary | ICD-10-CM | POA: Insufficient documentation

## 2021-06-12 DIAGNOSIS — I110 Hypertensive heart disease with heart failure: Secondary | ICD-10-CM | POA: Diagnosis not present

## 2021-06-12 DIAGNOSIS — N80399 Endometriosis of the pelvic peritoneum, other specified sites, unspecified depth: Secondary | ICD-10-CM | POA: Insufficient documentation

## 2021-06-12 DIAGNOSIS — N8003 Adenomyosis of the uterus: Secondary | ICD-10-CM | POA: Insufficient documentation

## 2021-06-12 DIAGNOSIS — I5020 Unspecified systolic (congestive) heart failure: Secondary | ICD-10-CM

## 2021-06-12 HISTORY — PX: LAPAROSCOPIC HYSTERECTOMY: SHX1926

## 2021-06-12 HISTORY — PX: CYSTOSCOPY: SHX5120

## 2021-06-12 HISTORY — DX: Other cardiomyopathies: I42.8

## 2021-06-12 HISTORY — DX: Pelvic and perineal pain: R10.2

## 2021-06-12 HISTORY — DX: Other complications of anesthesia, initial encounter: T88.59XA

## 2021-06-12 HISTORY — DX: Nontoxic multinodular goiter: E04.2

## 2021-06-12 HISTORY — DX: Carpal tunnel syndrome, bilateral upper limbs: G56.03

## 2021-06-12 LAB — TYPE AND SCREEN
ABO/RH(D): O POS
Antibody Screen: NEGATIVE

## 2021-06-12 LAB — ABO/RH: ABO/RH(D): O POS

## 2021-06-12 LAB — POCT PREGNANCY, URINE: Preg Test, Ur: NEGATIVE

## 2021-06-12 SURGERY — HYSTERECTOMY, TOTAL, LAPAROSCOPIC
Anesthesia: General | Site: Bladder

## 2021-06-12 MED ORDER — DIPHENHYDRAMINE HCL 50 MG/ML IJ SOLN
25.0000 mg | Freq: Four times a day (QID) | INTRAMUSCULAR | Status: DC | PRN
  Administered 2021-06-12 (×2): 12.5 mg via INTRAVENOUS

## 2021-06-12 MED ORDER — SENNOSIDES-DOCUSATE SODIUM 8.6-50 MG PO TABS
1.0000 | ORAL_TABLET | Freq: Every evening | ORAL | Status: DC | PRN
  Filled 2021-06-12: qty 1

## 2021-06-12 MED ORDER — ONDANSETRON HCL 4 MG PO TABS: 4.0000 mg | ORAL_TABLET | Freq: Four times a day (QID) | ORAL | Status: DC | PRN

## 2021-06-12 MED ORDER — LACTATED RINGERS IV SOLN
INTRAVENOUS | Status: DC
  Administered 2021-06-12: 12:00:00 1000 mL via INTRAVENOUS

## 2021-06-12 MED ORDER — SODIUM CHLORIDE (PF) 0.9 % IJ SOLN
INTRAMUSCULAR | Status: AC
  Filled 2021-06-12: qty 10

## 2021-06-12 MED ORDER — OXYCODONE HCL 5 MG PO TABS: 5.0000 mg | ORAL_TABLET | Freq: Once | ORAL | Status: DC | PRN

## 2021-06-12 MED ORDER — HYDROMORPHONE HCL 1 MG/ML IJ SOLN
INTRAMUSCULAR | Status: AC
  Filled 2021-06-12: qty 1

## 2021-06-12 MED ORDER — FENTANYL CITRATE (PF) 250 MCG/5ML IJ SOLN
INTRAMUSCULAR | Status: AC
  Filled 2021-06-12: qty 5

## 2021-06-12 MED ORDER — SIMETHICONE 80 MG PO CHEW: 80.0000 mg | CHEWABLE_TABLET | Freq: Four times a day (QID) | ORAL | Status: DC | PRN

## 2021-06-12 MED ORDER — KETOROLAC TROMETHAMINE 30 MG/ML IJ SOLN: 30.0000 mg | Freq: Once | INTRAMUSCULAR | Status: AC | PRN

## 2021-06-12 MED ORDER — CLINDAMYCIN PHOSPHATE 900 MG/50ML IV SOLN
900.0000 mg | INTRAVENOUS | Status: AC
  Administered 2021-06-12: 13:00:00 900 mg via INTRAVENOUS

## 2021-06-12 MED ORDER — PANTOPRAZOLE SODIUM 40 MG PO TBEC
DELAYED_RELEASE_TABLET | ORAL | Status: AC
  Filled 2021-06-12: qty 1

## 2021-06-12 MED ORDER — SUGAMMADEX SODIUM 200 MG/2ML IV SOLN
INTRAVENOUS | Status: DC | PRN
  Administered 2021-06-12: 100 mg via INTRAVENOUS

## 2021-06-12 MED ORDER — HYDROMORPHONE HCL 1 MG/ML IJ SOLN
0.2500 mg | INTRAMUSCULAR | Status: DC | PRN
Start: 2021-06-12 — End: 2021-06-13

## 2021-06-12 MED ORDER — SODIUM CHLORIDE 0.9 % IV SOLN
INTRAVENOUS | Status: DC | PRN
  Administered 2021-06-12: 120 mL

## 2021-06-12 MED ORDER — PHENYLEPHRINE 40 MCG/ML (10ML) SYRINGE FOR IV PUSH (FOR BLOOD PRESSURE SUPPORT)
PREFILLED_SYRINGE | INTRAVENOUS | Status: AC
  Filled 2021-06-12: qty 10

## 2021-06-12 MED ORDER — PROMETHAZINE HCL 25 MG/ML IJ SOLN
6.2500 mg | INTRAMUSCULAR | Status: DC | PRN
Start: 2021-06-12 — End: 2021-06-13

## 2021-06-12 MED ORDER — OXYCODONE HCL 5 MG PO TABS
5.0000 mg | ORAL_TABLET | ORAL | Status: DC | PRN
  Administered 2021-06-12: 17:00:00 10 mg via ORAL

## 2021-06-12 MED ORDER — LACTATED RINGERS IV SOLN: INTRAVENOUS | Status: DC

## 2021-06-12 MED ORDER — ACETAMINOPHEN 500 MG PO TABS: 1000.0000 mg | ORAL_TABLET | Freq: Once | ORAL | Status: DC

## 2021-06-12 MED ORDER — ONDANSETRON HCL 4 MG/2ML IJ SOLN
INTRAMUSCULAR | Status: AC
  Filled 2021-06-12: qty 2

## 2021-06-12 MED ORDER — KETOROLAC TROMETHAMINE 30 MG/ML IJ SOLN
INTRAMUSCULAR | Status: AC
  Filled 2021-06-12: qty 1

## 2021-06-12 MED ORDER — DEXAMETHASONE SODIUM PHOSPHATE 10 MG/ML IJ SOLN
INTRAMUSCULAR | Status: AC
  Filled 2021-06-12: qty 1

## 2021-06-12 MED ORDER — LIDOCAINE 2% (20 MG/ML) 5 ML SYRINGE
INTRAMUSCULAR | Status: AC
  Filled 2021-06-12: qty 5

## 2021-06-12 MED ORDER — GENTAMICIN SULFATE 40 MG/ML IJ SOLN
5.0000 mg/kg | INTRAVENOUS | Status: AC
  Administered 2021-06-12: 13:00:00 310 mg via INTRAVENOUS
  Filled 2021-06-12: qty 7.75

## 2021-06-12 MED ORDER — FENTANYL CITRATE (PF) 100 MCG/2ML IJ SOLN
INTRAMUSCULAR | Status: DC | PRN
  Administered 2021-06-12: 75 ug via INTRAVENOUS
  Administered 2021-06-12: 50 ug via INTRAVENOUS
  Administered 2021-06-12: 100 ug via INTRAVENOUS
  Administered 2021-06-12: 25 ug via INTRAVENOUS

## 2021-06-12 MED ORDER — PANTOPRAZOLE SODIUM 40 MG PO TBEC
40.0000 mg | DELAYED_RELEASE_TABLET | Freq: Every day | ORAL | Status: DC
  Administered 2021-06-12: 22:00:00 40 mg via ORAL

## 2021-06-12 MED ORDER — GLYCOPYRROLATE PF 0.2 MG/ML IJ SOSY
PREFILLED_SYRINGE | INTRAMUSCULAR | Status: DC | PRN
  Administered 2021-06-12: .2 mg via INTRAVENOUS

## 2021-06-12 MED ORDER — SACUBITRIL-VALSARTAN 49-51 MG PO TABS
1.0000 | ORAL_TABLET | Freq: Two times a day (BID) | ORAL | Status: DC
  Administered 2021-06-12: 22:00:00 1 via ORAL
  Filled 2021-06-12: qty 1

## 2021-06-12 MED ORDER — ACETAMINOPHEN 500 MG PO TABS
1000.0000 mg | ORAL_TABLET | Freq: Four times a day (QID) | ORAL | Status: DC
  Administered 2021-06-12 – 2021-06-13 (×2): 1000 mg via ORAL

## 2021-06-12 MED ORDER — ROCURONIUM BROMIDE 10 MG/ML (PF) SYRINGE
PREFILLED_SYRINGE | INTRAVENOUS | Status: AC
  Filled 2021-06-12: qty 10

## 2021-06-12 MED ORDER — POVIDONE-IODINE 10 % EX SWAB: 2.0000 "application " | Freq: Once | CUTANEOUS | Status: DC

## 2021-06-12 MED ORDER — ONDANSETRON HCL 4 MG/2ML IJ SOLN
INTRAMUSCULAR | Status: DC | PRN
  Administered 2021-06-12: 4 mg via INTRAVENOUS

## 2021-06-12 MED ORDER — PHENYLEPHRINE 40 MCG/ML (10ML) SYRINGE FOR IV PUSH (FOR BLOOD PRESSURE SUPPORT)
PREFILLED_SYRINGE | INTRAVENOUS | Status: DC | PRN
  Administered 2021-06-12 (×5): 80 ug via INTRAVENOUS
  Administered 2021-06-12: 120 ug via INTRAVENOUS
  Administered 2021-06-12 (×2): 80 ug via INTRAVENOUS
  Administered 2021-06-12: 40 ug via INTRAVENOUS
  Administered 2021-06-12: 80 ug via INTRAVENOUS
  Administered 2021-06-12: 120 ug via INTRAVENOUS

## 2021-06-12 MED ORDER — DIPHENHYDRAMINE HCL 50 MG/ML IJ SOLN
INTRAMUSCULAR | Status: AC
  Filled 2021-06-12: qty 1

## 2021-06-12 MED ORDER — DEXAMETHASONE SODIUM PHOSPHATE 10 MG/ML IJ SOLN
INTRAMUSCULAR | Status: DC | PRN
  Administered 2021-06-12: 10 mg via INTRAVENOUS

## 2021-06-12 MED ORDER — SCOPOLAMINE 1 MG/3DAYS TD PT72
1.0000 | MEDICATED_PATCH | TRANSDERMAL | Status: DC
  Administered 2021-06-12: 12:00:00 1.5 mg via TRANSDERMAL

## 2021-06-12 MED ORDER — HYDROCODONE-ACETAMINOPHEN 5-325 MG PO TABS
1.0000 | ORAL_TABLET | Freq: Four times a day (QID) | ORAL | Status: DC | PRN
  Administered 2021-06-13 (×2): 1 via ORAL
  Administered 2021-06-13: 2 via ORAL

## 2021-06-12 MED ORDER — EPHEDRINE SULFATE-NACL 50-0.9 MG/10ML-% IV SOSY
PREFILLED_SYRINGE | INTRAVENOUS | Status: DC | PRN
  Administered 2021-06-12 (×2): 5 mg via INTRAVENOUS
  Administered 2021-06-12 (×2): 10 mg via INTRAVENOUS
  Administered 2021-06-12: 5 mg via INTRAVENOUS
  Administered 2021-06-12: 10 mg via INTRAVENOUS

## 2021-06-12 MED ORDER — PROPOFOL 10 MG/ML IV BOLUS
INTRAVENOUS | Status: AC
  Filled 2021-06-12: qty 20

## 2021-06-12 MED ORDER — EPHEDRINE 5 MG/ML INJ
INTRAVENOUS | Status: AC
  Filled 2021-06-12: qty 5

## 2021-06-12 MED ORDER — SODIUM CHLORIDE 0.9 % IR SOLN
Status: DC | PRN
  Administered 2021-06-12: 1000 mL

## 2021-06-12 MED ORDER — ONDANSETRON HCL 4 MG/2ML IJ SOLN: 4.0000 mg | Freq: Four times a day (QID) | INTRAMUSCULAR | Status: DC | PRN

## 2021-06-12 MED ORDER — OXYCODONE HCL 5 MG/5ML PO SOLN: 5.0000 mg | Freq: Once | ORAL | Status: DC | PRN

## 2021-06-12 MED ORDER — SCOPOLAMINE 1 MG/3DAYS TD PT72
MEDICATED_PATCH | TRANSDERMAL | Status: AC
  Filled 2021-06-12: qty 1

## 2021-06-12 MED ORDER — GLYCOPYRROLATE PF 0.2 MG/ML IJ SOSY
PREFILLED_SYRINGE | INTRAMUSCULAR | Status: AC
  Filled 2021-06-12: qty 1

## 2021-06-12 MED ORDER — AMISULPRIDE (ANTIEMETIC) 5 MG/2ML IV SOLN: 10.0000 mg | Freq: Once | INTRAVENOUS | Status: DC | PRN

## 2021-06-12 MED ORDER — IBUPROFEN 800 MG PO TABS
ORAL_TABLET | ORAL | Status: AC
  Filled 2021-06-12: qty 1

## 2021-06-12 MED ORDER — FLUORESCEIN SODIUM 10 % IV SOLN
INTRAVENOUS | Status: DC | PRN
  Administered 2021-06-12: 100 mg via INTRAVENOUS

## 2021-06-12 MED ORDER — LIDOCAINE 2% (20 MG/ML) 5 ML SYRINGE
INTRAMUSCULAR | Status: DC | PRN
  Administered 2021-06-12: 100 mg via INTRAVENOUS

## 2021-06-12 MED ORDER — HYDROMORPHONE HCL 1 MG/ML IJ SOLN: 0.2000 mg | INTRAMUSCULAR | Status: DC | PRN

## 2021-06-12 MED ORDER — FLUORESCEIN SODIUM 10 % IV SOLN
INTRAVENOUS | Status: AC
  Filled 2021-06-12: qty 5

## 2021-06-12 MED ORDER — HYDROMORPHONE HCL 1 MG/ML IJ SOLN
0.2500 mg | INTRAMUSCULAR | Status: DC | PRN
Start: 2021-06-12 — End: 2021-06-13
  Administered 2021-06-12 (×3): 0.5 mg via INTRAVENOUS

## 2021-06-12 MED ORDER — ACETAMINOPHEN 500 MG PO TABS
ORAL_TABLET | ORAL | Status: AC
  Filled 2021-06-12: qty 2

## 2021-06-12 MED ORDER — MIDAZOLAM HCL 5 MG/5ML IJ SOLN
INTRAMUSCULAR | Status: DC | PRN
  Administered 2021-06-12: 2 mg via INTRAVENOUS

## 2021-06-12 MED ORDER — PROPOFOL 10 MG/ML IV BOLUS
INTRAVENOUS | Status: DC | PRN
  Administered 2021-06-12: 150 mg via INTRAVENOUS
  Administered 2021-06-12: 20 mg via INTRAVENOUS
  Administered 2021-06-12: 30 mg via INTRAVENOUS

## 2021-06-12 MED ORDER — MENTHOL 3 MG MT LOZG
LOZENGE | OROMUCOSAL | Status: AC
  Filled 2021-06-12: qty 9

## 2021-06-12 MED ORDER — ONDANSETRON HCL 4 MG/2ML IJ SOLN: 4.0000 mg | Freq: Once | INTRAMUSCULAR | Status: DC | PRN

## 2021-06-12 MED ORDER — KETOROLAC TROMETHAMINE 30 MG/ML IJ SOLN
INTRAMUSCULAR | Status: DC | PRN
  Administered 2021-06-12: 30 mg via INTRAVENOUS

## 2021-06-12 MED ORDER — OXYCODONE HCL 5 MG PO TABS
ORAL_TABLET | ORAL | Status: AC
  Filled 2021-06-12: qty 2

## 2021-06-12 MED ORDER — MENTHOL 3 MG MT LOZG: 1.0000 | LOZENGE | OROMUCOSAL | Status: DC | PRN

## 2021-06-12 MED ORDER — MIDAZOLAM HCL 2 MG/2ML IJ SOLN
INTRAMUSCULAR | Status: AC
  Filled 2021-06-12: qty 2

## 2021-06-12 MED ORDER — CLINDAMYCIN PHOSPHATE 900 MG/50ML IV SOLN
INTRAVENOUS | Status: AC
  Filled 2021-06-12: qty 50

## 2021-06-12 MED ORDER — IBUPROFEN 800 MG PO TABS
800.0000 mg | ORAL_TABLET | Freq: Three times a day (TID) | ORAL | Status: DC
  Administered 2021-06-12 – 2021-06-13 (×2): 800 mg via ORAL

## 2021-06-12 MED ORDER — ROCURONIUM BROMIDE 10 MG/ML (PF) SYRINGE
PREFILLED_SYRINGE | INTRAVENOUS | Status: DC | PRN
  Administered 2021-06-12 (×2): 20 mg via INTRAVENOUS
  Administered 2021-06-12: 50 mg via INTRAVENOUS
  Administered 2021-06-12: 10 mg via INTRAVENOUS

## 2021-06-12 SURGICAL SUPPLY — 80 items
ADH SKN CLS APL DERMABOND .7 (GAUZE/BANDAGES/DRESSINGS) ×3
BAG DRN RND TRDRP ANRFLXCHMBR (UROLOGICAL SUPPLIES) ×3
BAG URINE DRAIN 2000ML AR STRL (UROLOGICAL SUPPLIES) ×4 IMPLANT
BARRIER ADHS 3X4 INTERCEED (GAUZE/BANDAGES/DRESSINGS) ×4 IMPLANT
BRR ADH 4X3 ABS CNTRL BYND (GAUZE/BANDAGES/DRESSINGS) ×3
CABLE HIGH FREQUENCY MONO STRZ (ELECTRODE) ×4 IMPLANT
CATH FOLEY 3WAY  5CC 16FR (CATHETERS) ×2
CATH FOLEY 3WAY 5CC 16FR (CATHETERS) ×3 IMPLANT
COVER BACK TABLE 60X90IN (DRAPES) ×4 IMPLANT
COVER MAYO STAND STRL (DRAPES) ×4 IMPLANT
DERMABOND ADVANCED (GAUZE/BANDAGES/DRESSINGS) ×1
DERMABOND ADVANCED .7 DNX12 (GAUZE/BANDAGES/DRESSINGS) ×3 IMPLANT
DEVICE SUTURE ENDOST 10MM (ENDOMECHANICALS) IMPLANT
DISSECTOR BLUNT TIP ENDO 5MM (MISCELLANEOUS) IMPLANT
DRAPE LAPAROTOMY T 102X78X121 (DRAPES) ×4 IMPLANT
DRAPE WARM FLUID 44X44 (DRAPES) IMPLANT
DRSG COVADERM PLUS 2X2 (GAUZE/BANDAGES/DRESSINGS) IMPLANT
DRSG OPSITE POSTOP 3X4 (GAUZE/BANDAGES/DRESSINGS) IMPLANT
DRSG OPSITE POSTOP 4X10 (GAUZE/BANDAGES/DRESSINGS) ×4 IMPLANT
DURAPREP 26ML APPLICATOR (WOUND CARE) ×8 IMPLANT
GAUZE 4X4 16PLY ~~LOC~~+RFID DBL (SPONGE) ×8 IMPLANT
GLOVE SURG POLYISO LF SZ6.5 (GLOVE) ×8 IMPLANT
GLOVE SURG UNDER POLY LF SZ7 (GLOVE) ×16 IMPLANT
GOWN STRL REUS W/TWL LRG LVL3 (GOWN DISPOSABLE) ×12 IMPLANT
KIT TURNOVER CYSTO (KITS) ×4 IMPLANT
LIGASURE LAP L-HOOK RET 5 37 (INSTRUMENTS) ×4 IMPLANT
MANIPULATOR ADVINCU DEL 2.5 PL (MISCELLANEOUS) ×2 IMPLANT
MANIPULATOR ADVINCU DEL 3.0 PL (MISCELLANEOUS) IMPLANT
MANIPULATOR ADVINCU DEL 3.5 PL (MISCELLANEOUS) IMPLANT
MANIPULATOR ADVINCU DEL 4.0 PL (MISCELLANEOUS) IMPLANT
MANIPULATOR VCARE LG CRV RETR (MISCELLANEOUS) IMPLANT
MANIPULATOR VCARE SML CRV RETR (MISCELLANEOUS) IMPLANT
NEEDLE INSUFFLATION 120MM (ENDOMECHANICALS) ×4 IMPLANT
NS IRRIG 1000ML POUR BTL (IV SOLUTION) ×4 IMPLANT
NS IRRIG 500ML POUR BTL (IV SOLUTION) ×4 IMPLANT
PACK BASIN DAY SURGERY FS (CUSTOM PROCEDURE TRAY) ×4 IMPLANT
PACK LAPAROSCOPY BASIN (CUSTOM PROCEDURE TRAY) ×4 IMPLANT
PACK TRENDGUARD 450 HYBRID PRO (MISCELLANEOUS) IMPLANT
PAD OB MATERNITY 4.3X12.25 (PERSONAL CARE ITEMS) ×4 IMPLANT
PENCIL SMOKE EVACUATOR (MISCELLANEOUS) ×4 IMPLANT
POUCH LAPAROSCOPIC INSTRUMENT (MISCELLANEOUS) ×4 IMPLANT
PROTECTOR NERVE ULNAR (MISCELLANEOUS) ×8 IMPLANT
SCISSORS LAP 5X35 DISP (ENDOMECHANICALS) IMPLANT
SCOPETTES 8  STERILE (MISCELLANEOUS)
SCOPETTES 8 STERILE (MISCELLANEOUS) IMPLANT
SET IRRIG Y TYPE TUR BLADDER L (SET/KITS/TRAYS/PACK) IMPLANT
SET SUCTION IRRIG HYDROSURG (IRRIGATION / IRRIGATOR) ×4 IMPLANT
SET TRI-LUMEN FLTR TB AIRSEAL (TUBING) ×4 IMPLANT
SHEARS HARMONIC ACE PLUS 36CM (ENDOMECHANICALS) IMPLANT
SPONGE T-LAP 18X18 ~~LOC~~+RFID (SPONGE) IMPLANT
STAPLER VISISTAT 35W (STAPLE) IMPLANT
SUT ENDO VLOC 180-0-8IN (SUTURE) IMPLANT
SUT MNCRL AB 4-0 PS2 18 (SUTURE) ×8 IMPLANT
SUT MON AB 4-0 PS1 27 (SUTURE) ×4 IMPLANT
SUT PLAIN 2 0 XLH (SUTURE) IMPLANT
SUT VIC AB 0 CT1 18XCR BRD8 (SUTURE) IMPLANT
SUT VIC AB 0 CT1 27 (SUTURE) ×12
SUT VIC AB 0 CT1 27XBRD ANBCTR (SUTURE) ×9 IMPLANT
SUT VIC AB 0 CT1 8-18 (SUTURE)
SUT VIC AB 2-0 CT1 (SUTURE) IMPLANT
SUT VIC AB 3-0 CT1 27 (SUTURE)
SUT VIC AB 3-0 CT1 TAPERPNT 27 (SUTURE) IMPLANT
SUT VICRYL 0 27 CT2 27 ABS (SUTURE) IMPLANT
SUT VICRYL 0 TIES 12 18 (SUTURE) IMPLANT
SUT VICRYL 0 UR6 27IN ABS (SUTURE) ×4 IMPLANT
SUT VLOC 180 0 9IN  GS21 (SUTURE) ×3
SUT VLOC 180 0 9IN GS21 (SUTURE) ×3 IMPLANT
SYR 10ML LL (SYRINGE) IMPLANT
SYR 50ML LL SCALE MARK (SYRINGE) ×4 IMPLANT
SYSTEM CARTER THOMASON II (TROCAR) ×4 IMPLANT
TOWEL OR 17X26 10 PK STRL BLUE (TOWEL DISPOSABLE) ×8 IMPLANT
TRAY FOLEY W/BAG SLVR 14FR LF (SET/KITS/TRAYS/PACK) ×4 IMPLANT
TRENDGUARD 450 HYBRID PRO PACK (MISCELLANEOUS)
TROCAR 12M 150ML BLUNT (TROCAR) IMPLANT
TROCAR BLADELESS OPT 5 100 (ENDOMECHANICALS) ×8 IMPLANT
TROCAR PORT AIRSEAL 8X120 (TROCAR) ×4 IMPLANT
TROCAR XCEL NON-BLD 11X100MML (ENDOMECHANICALS) ×4 IMPLANT
TUBE CONNECTING 12X1/4 (SUCTIONS) ×4 IMPLANT
WARMER LAPAROSCOPE (MISCELLANEOUS) ×4 IMPLANT
YANKAUER SUCT BULB TIP NO VENT (SUCTIONS) ×4 IMPLANT

## 2021-06-12 NOTE — Discharge Summary (Signed)
Physician Discharge Summary  Patient ID: Leslie Esparza MRN: [id number] DOB/AGE: December 07, 1970 50 y.o.  Admit date: 06/12/2021 Discharge date: 06/13/2021  Admission Diagnoses: Chronic pelvic pain Leiomyoma Adenomyosis (Endometriosis) Non ischemic cardiomyopathy  Discharge Diagnoses:  Principal Problem:   Pelvic and perineal pain Active Problems:   S/P total hysterectomy  Leiomyoma Adenomyosis (Endometriosis) Non ischemic cardiomyopathy  Discharged Condition: good  Hospital Course: Patient underwent a total laparoscopic hysterectomy, bilateral salpingo-oophorectomy, cystoscopy and endometrial lesion excision as described in the operative notes.  Post operatively she did well and by post op day 1 she was able to tolerate a regular diet, ambulate and urinate without difficulty.  Her pain was well controlled and had minimal vaginal bleeding.  She was deemed stable for discharge.  She was instructed to hold her blood pressure medications if her blood pressure was too low or she had symptoms of dizziness or lightheadedness.   Consults: None  Significant Diagnostic Studies: labs:  CBC Latest Ref Rng & Units 06/13/2021 06/09/2021 04/23/2020  WBC 4.0 - 10.5 K/uL 13.0(H) 6.9 6.6  Hemoglobin 12.0 - 15.0 g/dL 11.1(L) 12.7 13.3  Hematocrit 36.0 - 46.0 % 32.7(L) 38.4 39.5  Platelets 150 - 400 K/uL 341 384 350    BMP Latest Ref Rng & Units 06/13/2021 06/09/2021 06/17/2020  Glucose 70 - 99 mg/dL 154(H) 85 127(H)  BUN 6 - 20 mg/dL 9 14 22(H)  Creatinine 0.44 - 1.00 mg/dL 0.56 0.55 0.63  BUN/Creat Ratio 9 - 23 - - -  Sodium 135 - 145 mmol/L 132(L) 136 136  Potassium 3.5 - 5.1 mmol/L 4.3 4.1 4.3  Chloride 98 - 111 mmol/L 103 108 101  CO2 22 - 32 mmol/L 22 24 25   Calcium 8.9 - 10.3 mg/dL 8.4(L) 8.4(L) 9.5     Treatments: IV hydration, antibiotics: gentamicin and Clindamycin and surgery as above.   Discharge Exam: Blood pressure (!) 94/58, pulse 68, temperature 98.4 F (36.9 C),  temperature source Oral, resp. rate 20, height 5' 1.5" (1.562 m), weight 61.1 kg, last menstrual period 06/06/2021, SpO2 99 %.  General appearance: alert, cooperative, and no distress Throat: abnormal findings: small abrasion back of throat.  Neck: no adenopathy, supple, symmetrical, trachea midline, and thyroid not enlarged, symmetric, no tenderness/mass/nodules Resp: clear to auscultation bilaterally Cardio: regular rate and rhythm, S1, S2 normal, no murmur, click, rub or gallop GI: soft, non-tender; bowel sounds normal; no masses,  no organomegaly Pelvic: Deferred.  Peripad: Scant bleeding.  Extremities: extremities normal, atraumatic, no cyanosis or edema Incisions: Clean, dry, with slight erythema and bruising.  Disposition: Home.     Follow-up Information     Waymon Amato, MD. Go on 07/24/2021.   Specialty: Obstetrics and Gynecology Why: For 6 week post op check. Contact information: [place] [address] [phone]         Waymon Amato, MD. Schedule an appointment as soon as possible for a visit in 2 week(s).   Specialty: Obstetrics and Gynecology Why: 2 week post-op check. Contact information: [place] [address] [phone]                Allergies as of 06/13/2021       Reactions   Oxycodone Itching, Rash   Penicillins Rash   had reaction as a child        Medication List     TAKE these medications    Entresto 49-51 MG Generic drug: sacubitril-valsartan Take 1 tablet by mouth 2 (  two) times daily.   FLUoxetine 40 MG capsule Commonly known as: PROZAC Take 80 mg by mouth daily.   HYDROcodone-acetaminophen 5-325 MG tablet Commonly known as: NORCO/VICODIN Take 1-2 tablets by mouth every 6 (six) hours as needed for severe pain or moderate pain. Notes to patient: Can take next dose at 3pm   ibuprofen 800 MG tablet Commonly known as: ADVIL Take 1 tablet (800 mg total) by mouth every 8  (eight) hours. Notes to patient: Can take next dose at 1pm   metoprolol succinate 25 MG 24 hr tablet Commonly known as: Toprol XL Take 1 tablet (25 mg total) by mouth daily.   valACYclovir 1000 MG tablet Commonly known as: VALTREX As needed for cold sores   Vitamin D 125 MCG (5000 UT) Caps Take 5,000 Units by mouth daily.         Signed: Archie Endo 06/13/2021, 09:06 AM

## 2021-06-12 NOTE — Anesthesia Procedure Notes (Signed)
Procedure Name: Intubation Date/Time: 06/12/2021 12:55 PM Performed by: Rogers Blocker, CRNA Pre-anesthesia Checklist: Patient identified, Emergency Drugs available, Suction available and Patient being monitored Patient Re-evaluated:Patient Re-evaluated prior to induction Oxygen Delivery Method: Circle System Utilized Preoxygenation: Pre-oxygenation with 100% oxygen Induction Type: IV induction Ventilation: Mask ventilation without difficulty Laryngoscope Size: Mac and 3 Grade View: Grade I Tube type: Oral Tube size: 7.0 mm Number of attempts: 1 Airway Equipment and Method: Stylet and Bite block Placement Confirmation: ETT inserted through vocal cords under direct vision, positive ETCO2 and breath sounds checked- equal and bilateral Secured at: 21 cm Tube secured with: Tape Dental Injury: Teeth and Oropharynx as per pre-operative assessment

## 2021-06-12 NOTE — Transfer of Care (Signed)
Immediate Anesthesia Transfer of Care Note  Patient: Leslie Esparza  Procedure(s) Performed: HYSTERECTOMY TOTAL LAPAROSCOPIC (Abdomen) CYSTOSCOPY (Bladder)  Patient Location: PACU  Anesthesia Type:General  Level of Consciousness: awake, patient cooperative and responds to stimulation  Airway & Oxygen Therapy: Patient Spontanous Breathing and Patient connected to face mask oxygen  Post-op Assessment: Report given to RN and Post -op Vital signs reviewed and stable  Post vital signs: Reviewed and stable  Last Vitals:  Vitals Value Taken Time  BP 111/71 06/12/21 1546  Temp    Pulse 88 06/12/21 1550  Resp 13 06/12/21 1550  SpO2 100 % 06/12/21 1550  Vitals shown include unvalidated device data.  Last Pain:  Vitals:   06/12/21 1155  TempSrc: Oral  PainSc: 0-No pain      Patients Stated Pain Goal: 5 (69/86/14 8307)  Complications: No notable events documented.

## 2021-06-12 NOTE — Anesthesia Preprocedure Evaluation (Addendum)
Anesthesia Evaluation  Patient identified by MRN, date of birth, ID band  Reviewed: Allergy & Precautions, NPO status , Patient's Chart, lab work & pertinent test results, reviewed documented beta blocker date and time   History of Anesthesia Complications Negative for: history of anesthetic complications  Airway Mallampati: II  TM Distance: >3 FB Neck ROM: Full    Dental no notable dental hx. (+) Teeth Intact, Dental Advisory Given   Pulmonary neg pulmonary ROS,    Pulmonary exam normal breath sounds clear to auscultation       Cardiovascular hypertension, Pt. on medications and Pt. on home beta blockers +CHF (LVEF 65-78%, grade 1 diastolic dysfunction)  Normal cardiovascular exam Rhythm:Regular Rate:Normal  Echo 02/2021: 1. Left ventricular ejection fraction, by estimation, is 45 to 50%. The  left ventricle has mildly decreased function. The left ventricle  demonstrates global hypokinesis. Left ventricular diastolic parameters are  consistent with Grade I diastolic  dysfunction (impaired relaxation).  2. Right ventricular systolic function is normal. The right ventricular  size is normal. Tricuspid regurgitation signal is inadequate for assessing  PA pressure.  3. The mitral valve is normal in structure. Trivial mitral valve  regurgitation. No evidence of mitral stenosis.  4. The aortic valve is tricuspid. Aortic valve regurgitation is not  visualized. No aortic stenosis is present.  5. The inferior vena cava is normal in size with greater than 50%  respiratory variability, suggesting right atrial pressure of 3 mmHg.    Neuro/Psych  Headaches, PSYCHIATRIC DISORDERS Anxiety Depression    GI/Hepatic negative GI ROS, Neg liver ROS,   Endo/Other  negative endocrine ROS  Renal/GU negative Renal ROS  negative genitourinary   Musculoskeletal  (+) Arthritis , Osteoarthritis,    Abdominal   Peds  Hematology negative  hematology ROS (+) hct 38.4, plt 384   Anesthesia Other Findings   Reproductive/Obstetrics negative OB ROS                            Anesthesia Physical Anesthesia Plan  ASA: 3  Anesthesia Plan: General   Post-op Pain Management: Toradol IV (intra-op) and Tylenol PO (pre-op)   Induction: Intravenous  PONV Risk Score and Plan: 4 or greater and Ondansetron, Dexamethasone, Midazolam, Scopolamine patch - Pre-op and Treatment may vary due to age or medical condition  Airway Management Planned: Oral ETT  Additional Equipment: None  Intra-op Plan:   Post-operative Plan: Extubation in OR  Informed Consent: I have reviewed the patients History and Physical, chart, labs and discussed the procedure including the risks, benefits and alternatives for the proposed anesthesia with the patient or authorized representative who has indicated his/her understanding and acceptance.     Dental advisory given  Plan Discussed with: CRNA and Anesthesiologist  Anesthesia Plan Comments:        Anesthesia Quick Evaluation

## 2021-06-12 NOTE — Interval H&P Note (Signed)
History and Physical Interval Note:  06/12/2021 12:06 PM  Leslie Esparza  has presented today for surgery, with the diagnosis of PELVIC AND PERINEAL PAIN.  The various methods of treatment have been discussed with the patient and family. After consideration of risks, benefits and other options for treatment, the patient has consented to  [place], BILATERAL SALPINGECTOMY, CYSTOSCOPY, POSSIBLE OVARIAN CYSTECTOMY, POSSIBLE LAPAROTOMY, POSSIBLE REMOVAL OF OVARIES as a surgical intervention.  The patient's history has been reviewed, patient examined, no change in status, stable for surgery.  I have reviewed the patient's chart and labs.  Questions were answered to the patient's satisfaction.    Archie Endo, MD.  06/12/2021. 12:08 PM.

## 2021-06-12 NOTE — Anesthesia Postprocedure Evaluation (Signed)
Anesthesia Post Note  Patient: Leslie Esparza  Procedure(s) Performed: HYSTERECTOMY TOTAL LAPAROSCOPIC (Abdomen) CYSTOSCOPY (Bladder)     Patient location during evaluation: PACU Anesthesia Type: General Level of consciousness: awake and sedated Pain management: pain level controlled Vital Signs Assessment: post-procedure vital signs reviewed and stable Respiratory status: spontaneous breathing Cardiovascular status: stable Postop Assessment: no apparent nausea or vomiting Anesthetic complications: no   No notable events documented.  Last Vitals:  Vitals:   06/12/21 1615 06/12/21 1647  BP: 108/76 109/72  Pulse: 91 87  Resp: 13 14  Temp:  36.7 C  SpO2: 100% 95%    Last Pain:  Vitals:   06/12/21 1546  TempSrc:   PainSc: [name]

## 2021-06-12 NOTE — Progress Notes (Signed)
Upon assessment, pt noted to have facial flushing and rash to cheek area with flat red areas. Pt c/o itching, observed scratching face. Also c/o sore throat, denies SOB or dysphagia. RN performed visual assessment and noted small abrasion to back of left tonsillar area. Dr. Alesia Richards notified, new orders obtained. VSS. Will continue to monitor.

## 2021-06-12 NOTE — Discharge Instructions (Addendum)
Magda Paganini,  1.  Please take the pain medication as needed for pain, do call me if your pain is not being well controlled with the pain medications.  Do not exceed more than 4000 mg of acetaminophen from all sources (e.g tylenol and vicodin)  in 24 hours.    2. You may have some shoulder and back pain because of gas in the body from the procedure, this should improve over time as you ambulate regularly.  3. I encourage you to ambulate regularly every day, for at least half an hour every day as this will help with your recovery.    4. Expect some light vaginal bleeding and soreness in vagina/lower abdomen.  The bleeding may slightly increase in 2 - 4 weeks as normal healing process continues, this is normal.  However if bleeding is excessive at any point,i.e you soaking a pad in 1 hour please give me a call.    5. Do not have any sexual intercourse or tampons or douching or baths for the next six weeks.   6.  You may take your showers as usual, but do not apply soap directly to the incisions.  After showering please pat the incisions dry with a towel.  You may see some sticky glue material over the incisions, this is glue used for the procedure and will fall off by itself.    7. Avoid heavy lifting for the next 6 weeks, i.e do not lift anything more than 30 lb for the next 6 weeks.   8. You have an appointment to see me on 07/24/2021.  Please keep your appointment as scheduled but also call (or someone will call you) to make a 2 week post op check appointment.   9. Your urine may have a yellow discoloration for the next few days, but will clear up within the next few days.  Please call my office with any questions or concerns.   I wish you a speedy recovery. Dr. Alesia Richards. [facility] Phone: [phone].   Extension: [phone].

## 2021-06-12 NOTE — Op Note (Signed)
Leslie Esparza. Hausmann MRN: [id number] DOB: Oct 15, 1970 Date of procedure: 06/12/2021  Pre-op diagnosis:  Chronic pelvic pain.  2. Leiomyoma. 3. Adenomyosis.   4. 10 week sized uterus with minimal uterine descensus.  5. Failed conservative management of medication (over counter pain medication, narcotics and muscle relaxants) and now desiring definitive therapy via a hysterectomy.    6. Desire to decrease future risk of ovarian and fallopian tube cancer.    7. BMI 25.      Post-op diagnosis: Same as above and  8. Endometriosis lesions on anterior abdominal wall and posterior aspect of uterus.      Surgeries:  1. Total Laparoscopic Hysterectomy and Bilateral salpingo-oophorectomy.  2. Cystoscopy.  3.  Excision of endometriosis lesion on the left anterior pelvic wall.    Surgeon: Dr. Waymon Amato.    Physician as an Environmental consultant: Dr. Sanjuana Kava.  Attending attestation: I was present and scrubbed and performed the procedure and the Physician as an assistant was required due to the complexity of anatomy.      Anesthesia: General Endotracheal: Dr. Andres Shad.     EBL: 100 cc  IV Fluids:  1000 cc  Urine output: 100 cc clear urine   Complications: None.   Findings:  1.10 week sized uterus  (217 grams total weight of specimen) with at least one subserosal fibroid with minimal descensus. 2. Normal right and left fallopian tubes, normal left and right ovaries. 3. Normal bladder with strong bilateral ureteral jets seen during cystoscopy, urine stained with fluorescin dye.    4. Normal appendix, normal appearing liver edge and bowel.  5.  Hyperpigmented 5 mm lesion on anterior left pelvic wall likely endometriosis. Several erythematous and serous translucent blebs on the lower posterior side of the uterus.     Indications: 50 y/o female with a long history of chronic pelvic pain, fibroids and adenomyosis who failed conservative medical treatment and desired definitive treatment and  therefore presented for a hysterectomy.  Patient also desired opportunistic bilateral salpingectomy to decrease her future risk of fallopian tube and ovarian cancer.  She desires removal of ovaries if there was significant pathology or endometriosis lesions detected during the procedure.       Procedure: Informed consent was obtained from the patient. She was taken to the operating room where anesthesia was administered.  She was carefully positioned in the operating room table in dorsal lithotomy position with both arms tucked to her sides.  She was prepped and draped in the usual sterile fashion.  A foley catheter was  placed in the bladder. Speculum was placed in the vagina.  Tenaculum placed at cervix and cervix dilated to # 23 Pratt dilator.  A stitch was placed on the cervix  at 12 o'clock position using 0 vicryl.  Cervical size was found to be 2.5 cm. The number 2.5 cm Advincula uterine manipulator was placed and uterine bulb instilled with 7 cc of NS. The cervical suture was passed around the cervical cup and tied.   Attention was then turned to the abdomen. 0.5% ropivacaine was injected in umbilical area, infraumbilical skin was incised and 11 mm excel trocar placed under direct visualization.  Correct entry was confirmed with visualization of internal pelvic organs and gas was insufflated.  She was placed in T-burg position and maximum gas pressure set was 15 mm Hg.  Two other side ports were placed on the left side of the abdomen under direct visualization, one 57mm at level of umbulicus about 62IR laterally and  a 1mm Airseal trocar at the lower level 2 fingerbreadths medial and superior to anterior superior iliac spine. One 73mm port was placed on the opposite lower right side under direct visualization.  The bowel was moved out of the way with an atraumatic probe and the abdomen was surveyed with above findings.  The ureters were identified at the pelvic brim bilaterally.  The left infundibulopelvic  ligament was cauterized and then transected with the Ligasure with L-  Hook, which was the device used for the procedure unless stated otherwise.  The left fallopian tube mesosalpinx, uteroovarian ligament and round ligament were then cauterized and transected.  Active uterine manipulation was provided by the surgical tech during the entire procedure as needed for exposure of the surgical field.  Advancement was made from the round ligament onto the mesometrium until the uterine artery level above the Koh cup was reached.  The bladder flap was then created and the bladder dissected away from the cervix to the level of the pubovesical cervical fascia.  The posterior peritoneum was taken down then the left uterine artery was cauterized at the level of the Koh cup and above it.  Attention was then turned to the the right side where the right infundibulopelvic ligament was cauterized and cut.  The right fallopian tube mesosalpinx was cauterized and transected, then the utero-ovarian, round and mesometrium were taken down similarly.  The bladder flap was developed on the right side.  The posterior peritoneum was similarly dissected.    The right uterine artery was  similarly cauterized and transected at the level of the Koh cup.  The Koh cup was palpated with the ligasure and then uterus transected at that level with the L- Hook of the ligasure circumferentially while pushing upwards on the manipulator.  The uterus with cervix was then delivered through the vagina with the manipulator.  Small bleeders on the cuff were contained with ligasure.  The vaginal cuff was then closed with a running stitch of 0 V-lock 9 inch laparoscopically, ensuring to incorporate the bilateral uterosacral ligaments in the cuff closure.  Irrigation was applied and suctioned out.  Arista was placed on the vaginal cuff to enhance hemostasis.  Intercede was placed over the vaginal cuff and ropivacaine 0.5% solution (30 cc ropivacaine mixed with 30  cc NS) was instilled into the pelvis. The lesion on the anterior abdominal wall was grasped with a maryland grasper and peritoneum and lesion tented up and transected with the endoshears.  Good hemostasis was noted on the area where the lesion was removed.  Gas was turned off and released from the abdomen. The ports were removed under direct visualization.  Patient was taken out of Trendelenburg.  The umbilical incision was closed with 0-vicryl on the fascia and 4-0 monocryl used to close all the skin incisions in a subcuticular stitch.  Dermabond was applied over the incisions.  Cystoscopy was performed with the 70 degree scope and above findings noted.  The scope was removed and foley catheter was replaced.  The patient was cleaned and awoken from anesthesia and taken to the recovery room in stable condition.  Final count was correct.     Specimen:  Uterus with cervix.  Right and left fallopian tubes and ovaries bilaterally.    Disposition: TO PACU in stable condition.   Waymon Amato, MD. 06/12/2021 07:06 PM.

## 2021-06-13 DIAGNOSIS — R102 Pelvic and perineal pain: Secondary | ICD-10-CM | POA: Diagnosis not present

## 2021-06-13 LAB — CBC
HCT: 32.7 % — ABNORMAL LOW (ref 36.0–46.0)
Hemoglobin: 11.1 g/dL — ABNORMAL LOW (ref 12.0–15.0)
MCH: 30.2 pg (ref 26.0–34.0)
MCHC: 33.9 g/dL (ref 30.0–36.0)
MCV: 89.1 fL (ref 80.0–100.0)
Platelets: 341 10*3/uL (ref 150–400)
RBC: 3.67 MIL/uL — ABNORMAL LOW (ref 3.87–5.11)
RDW: 13.6 % (ref 11.5–15.5)
WBC: 13 10*3/uL — ABNORMAL HIGH (ref 4.0–10.5)
nRBC: 0 % (ref 0.0–0.2)

## 2021-06-13 LAB — BASIC METABOLIC PANEL
Anion gap: 7 (ref 5–15)
BUN: 9 mg/dL (ref 6–20)
CO2: 22 mmol/L (ref 22–32)
Calcium: 8.4 mg/dL — ABNORMAL LOW (ref 8.9–10.3)
Chloride: 103 mmol/L (ref 98–111)
Creatinine, Ser: 0.56 mg/dL (ref 0.44–1.00)
GFR, Estimated: >60 mL/min (ref >60)
Glucose, Bld: 154 mg/dL — ABNORMAL HIGH (ref 70–99)
Potassium: 4.3 mmol/L (ref 3.5–5.1)
Sodium: 132 mmol/L — ABNORMAL LOW (ref 135–145)

## 2021-06-13 MED ORDER — HYDROCODONE-ACETAMINOPHEN 5-325 MG PO TABS
ORAL_TABLET | ORAL | Status: AC
  Filled 2021-06-13: qty 1

## 2021-06-13 MED ORDER — IBUPROFEN 800 MG PO TABS
ORAL_TABLET | ORAL | Status: AC
  Filled 2021-06-13: qty 1

## 2021-06-13 MED ORDER — HYDROCODONE-ACETAMINOPHEN 5-325 MG PO TABS
ORAL_TABLET | ORAL | Status: AC
  Filled 2021-06-13: qty 2

## 2021-06-13 MED ORDER — HYDROCODONE-ACETAMINOPHEN 5-325 MG PO TABS: 1.0000 | ORAL_TABLET | Freq: Four times a day (QID) | ORAL | 0 refills | Status: DC | PRN

## 2021-06-13 MED ORDER — IBUPROFEN 800 MG PO TABS: 800.0000 mg | ORAL_TABLET | Freq: Three times a day (TID) | ORAL | 0 refills | Status: AC

## 2021-06-13 MED ORDER — ACETAMINOPHEN 500 MG PO TABS
ORAL_TABLET | ORAL | Status: AC
  Filled 2021-06-13: qty 2

## 2021-06-13 NOTE — Progress Notes (Addendum)
1 Day Post-Op Procedure(s) (LRB): HYSTERECTOMY TOTAL LAPAROSCOPIC (N/A) CYSTOSCOPY (N/A)  Subjective: Patient reports tolerating PO and no problems voiding.    Objective: I have reviewed patient's vital signs, intake and output, medications, and labs. Blood pressure (!) 103/54, pulse 70, temperature 98.4 F (36.9 C), temperature source Oral, resp. rate (!) 24, height 5' 1.5" (1.562 m), weight 61.1 kg, last menstrual period 06/06/2021, SpO2 99 %.  Urine output: 1750/8 hours.  Input: 800cc/8 hours.  General: alert, cooperative, and no distress Resp: clear to auscultation bilaterally Cardio: regular rate and rhythm, S1, S2 normal, no murmur, click, rub or gallop GI: soft, non-tender; bowel sounds normal; no masses,  no organomegaly, incision: dry, intact, and slight erythema and bruising over incisions.  Extremities: extremities normal, atraumatic, no cyanosis or edema Vaginal Bleeding: minimal  CBC    Component Value Date/Time   WBC 13.0 (H) 06/13/2021 0141   RBC 3.67 (L) 06/13/2021 0141   HGB 11.1 (L) 06/13/2021 0141   HCT 32.7 (L) 06/13/2021 0141   PLT 341 06/13/2021 0141   MCV 89.1 06/13/2021 0141   MCH 30.2 06/13/2021 0141   MCHC 33.9 06/13/2021 0141   RDW 13.6 06/13/2021 0141   LYMPHSABS 1.6 05/07/2014 1234   MONOABS 1.0 05/07/2014 1234   EOSABS 0.1 05/07/2014 1234   BASOSABS 0.0 05/07/2014 1234      Assessment: s/p Procedure(s): HYSTERECTOMY TOTAL LAPAROSCOPIC (N/A) CYSTOSCOPY (N/A): BILATERAL SALPINGO-OPHORECTOMY, EXCISION OF ENDOMETRIOSIS LESION, stable  Plan: Discharge home Discussed with patient intra-op procedure and findings and questions answered.   LOS: 0 days   Pollyann Samples [facility] 06/13/2021, 8:52 AM

## 2021-06-16 ENCOUNTER — Encounter (HOSPITAL_BASED_OUTPATIENT_CLINIC_OR_DEPARTMENT_OTHER): Payer: Self-pay | Admitting: Obstetrics & Gynecology

## 2021-06-18 LAB — SURGICAL PATHOLOGY

## 2021-06-23 ENCOUNTER — Encounter: Payer: Self-pay | Admitting: Cardiology

## 2021-06-24 ENCOUNTER — Telehealth: Payer: Self-pay

## 2021-06-24 NOTE — Telephone Encounter (Signed)
Called patient and informed her to hold off the Metoprolol for now, that I would discuss this with Dr. Garen Lah tomorrow while we are in clinic and give her a call back with his recommendations. Patient was agreeable and grateful for the follow up.     Hi Dr. Garen Lah,   I am a week and half post op after a total hysterectomy. Initially, after surgery I was taking Hydrocodone and Ibuprofen for pain. This caused my blood pressure readings to be low and caused feelings of passing out. I stopped taking the Metoprolol with the advice of my OBGYN surgeon to help with this. I am a week and half out from surgery and now down to just Ibuprofen and Tylenol as needed for pain. I have been monitoring my blood pressure without the Metoprolol and my readings the past two days have been around 101/71 and 111/70 with light housework activity.  What is your recommendation for starting to take it again or do you think I need it? Thank you.

## 2021-06-25 NOTE — Telephone Encounter (Signed)
Spoke with Dr. Garen Lah and he recommended that he patient restart her Metoprolol Succinate 25 MG once daily. Called patient and informed her of the recommendations. Patient verbalized understanding and agreed with plan.

## 2021-07-10 ENCOUNTER — Encounter (HOSPITAL_COMMUNITY): Payer: Self-pay | Admitting: Emergency Medicine

## 2021-07-10 ENCOUNTER — Emergency Department (HOSPITAL_COMMUNITY)
Admission: EM | Admit: 2021-07-10 | Discharge: 2021-07-10 | Disposition: A | Discharge status: Home / Self Care | Payer: 59 | Attending: Emergency Medicine | Admitting: Emergency Medicine

## 2021-07-10 ENCOUNTER — Emergency Department (HOSPITAL_COMMUNITY): Payer: 59

## 2021-07-10 ENCOUNTER — Other Ambulatory Visit: Payer: Self-pay

## 2021-07-10 DIAGNOSIS — R110 Nausea: Secondary | ICD-10-CM | POA: Insufficient documentation

## 2021-07-10 DIAGNOSIS — R519 Headache, unspecified: Secondary | ICD-10-CM | POA: Diagnosis not present

## 2021-07-10 DIAGNOSIS — R509 Fever, unspecified: Secondary | ICD-10-CM | POA: Insufficient documentation

## 2021-07-10 DIAGNOSIS — R5383 Other fatigue: Secondary | ICD-10-CM | POA: Insufficient documentation

## 2021-07-10 DIAGNOSIS — M791 Myalgia, unspecified site: Secondary | ICD-10-CM | POA: Diagnosis not present

## 2021-07-10 DIAGNOSIS — Z20822 Contact with and (suspected) exposure to covid-19: Secondary | ICD-10-CM | POA: Insufficient documentation

## 2021-07-10 DIAGNOSIS — R630 Anorexia: Secondary | ICD-10-CM | POA: Insufficient documentation

## 2021-07-10 DIAGNOSIS — R5381 Other malaise: Secondary | ICD-10-CM | POA: Diagnosis not present

## 2021-07-10 DIAGNOSIS — R0981 Nasal congestion: Secondary | ICD-10-CM | POA: Insufficient documentation

## 2021-07-10 DIAGNOSIS — Z79899 Other long term (current) drug therapy: Secondary | ICD-10-CM | POA: Insufficient documentation

## 2021-07-10 DIAGNOSIS — R21 Rash and other nonspecific skin eruption: Secondary | ICD-10-CM | POA: Diagnosis present

## 2021-07-10 LAB — URINALYSIS, ROUTINE W REFLEX MICROSCOPIC
Bacteria, UA: NONE SEEN
Bilirubin Urine: NEGATIVE
Glucose, UA: NEGATIVE mg/dL
Ketones, ur: NEGATIVE mg/dL
Leukocytes,Ua: NEGATIVE
Nitrite: NEGATIVE
Protein, ur: 30 mg/dL — AB
Specific Gravity, Urine: 1.016 (ref 1.005–1.030)
pH: 5 (ref 5.0–8.0)

## 2021-07-10 LAB — COMPREHENSIVE METABOLIC PANEL
ALT: 30 U/L (ref 0–44)
AST: 26 U/L (ref 15–41)
Albumin: 3.5 g/dL (ref 3.5–5.0)
Alkaline Phosphatase: 60 U/L (ref 38–126)
Anion gap: 8 (ref 5–15)
BUN: 8 mg/dL (ref 6–20)
CO2: 23 mmol/L (ref 22–32)
Calcium: 8.7 mg/dL — ABNORMAL LOW (ref 8.9–10.3)
Chloride: 104 mmol/L (ref 98–111)
Creatinine, Ser: 0.61 mg/dL (ref 0.44–1.00)
GFR, Estimated: >60 mL/min (ref >60)
Glucose, Bld: 98 mg/dL (ref 70–99)
Potassium: 3.8 mmol/L (ref 3.5–5.1)
Sodium: 135 mmol/L (ref 135–145)
Total Bilirubin: 0.4 mg/dL (ref 0.3–1.2)
Total Protein: 6.8 g/dL (ref 6.5–8.1)

## 2021-07-10 LAB — CBC WITH DIFFERENTIAL/PLATELET
Abs Immature Granulocytes: 0 10*3/uL (ref 0.00–0.07)
Basophils Absolute: 0.1 10*3/uL (ref 0.0–0.1)
Basophils Relative: 1 %
Eosinophils Absolute: 0.5 10*3/uL (ref 0.0–0.5)
Eosinophils Relative: 6 %
HCT: 38.3 % (ref 36.0–46.0)
Hemoglobin: 12.6 g/dL (ref 12.0–15.0)
Lymphocytes Relative: 24 %
Lymphs Abs: 1.9 10*3/uL (ref 0.7–4.0)
MCH: 29.5 pg (ref 26.0–34.0)
MCHC: 32.9 g/dL (ref 30.0–36.0)
MCV: 89.7 fL (ref 80.0–100.0)
Monocytes Absolute: 0.3 10*3/uL (ref 0.1–1.0)
Monocytes Relative: 4 %
Neutro Abs: 5.1 10*3/uL (ref 1.7–7.7)
Neutrophils Relative %: 65 %
Platelets: 225 10*3/uL (ref 150–400)
RBC: 4.27 MIL/uL (ref 3.87–5.11)
RDW: 13.3 % (ref 11.5–15.5)
WBC: 7.8 10*3/uL (ref 4.0–10.5)
nRBC: 0 % (ref 0.0–0.2)

## 2021-07-10 LAB — CBG MONITORING, ED: Glucose-Capillary: 121 mg/dL — ABNORMAL HIGH (ref 70–99)

## 2021-07-10 LAB — LACTIC ACID, PLASMA: Lactic Acid, Venous: 1.2 mmol/L (ref 0.5–1.9)

## 2021-07-10 LAB — RESP PANEL BY RT-PCR (FLU A&B, COVID) ARPGX2
Influenza A by PCR: NEGATIVE
Influenza B by PCR: NEGATIVE
SARS Coronavirus 2 by RT PCR: NEGATIVE

## 2021-07-10 NOTE — ED Provider Notes (Signed)
[facility] DEPT Provider Note   CSN: [id number] Arrival date & time: 07/10/21  1203     History  Chief Complaint  Patient presents with   Rash   Fever   Generalized Body Aches   Chills    Leslie Esparza is a 51 y.o. female with a history of laparoscopic hysterectomy and bilateral nephrectomy and endometrial lesion excision on December 30, approximately 1 month ago, presenting to the ED with general malaise and rash.  Patient reports that her OB/GYN had prescribed her Bactrim to treat for possible cellulitis with some concern for skin reddening around one of the port sites a week ago.  She took several days of Bactrim and then stopped because it was making her feel nauseous.  She resumed the Bactrim yesterday, and reports that she woke up today with a generalized rash.  She has a blanching diffuse body rash involving the neck, abdomen, pelvis, bilateral arms and legs.  She says she has had similar rashes in the past, is never sure what causes them.  She did feel that she was running a fever yesterday with a temp of 101.  She feels he has a mild headache and some congestion.  She has poor appetite.  Her daughter was sick with a viral type syndrome approximately 2 to 3 weeks ago in the house.  No other sick contacts.  Patient is here with her husband at bedside.  She reports allergies to penicillin and oxycodone.  She has no known sulfa allergies  HPI     Home Medications Prior to Admission medications   Medication Sig Start Date End Date Taking? Authorizing Provider  Cholecalciferol (VITAMIN D) 125 MCG (5000 UT) CAPS Take 5,000 Units by mouth daily.    [provider]  FLUoxetine (PROZAC) 40 MG capsule Take 80 mg by mouth daily.    [provider]  HYDROcodone-acetaminophen (NORCO/VICODIN) 5-325 MG tablet Take 1-2 tablets by mouth every 6 (six) hours as needed for severe pain or moderate pain. 06/13/21   Waymon Amato, MD  ibuprofen  (ADVIL) 800 MG tablet Take 1 tablet (800 mg total) by mouth every 8 (eight) hours. 06/13/21   Waymon Amato, MD  metoprolol succinate (TOPROL XL) 25 MG 24 hr tablet Take 1 tablet (25 mg total) by mouth daily. 04/01/21   Kate Sable, MD  sacubitril-valsartan (ENTRESTO) 49-51 MG Take 1 tablet by mouth 2 (two) times daily. 04/06/21   Kate Sable, MD  valACYclovir (VALTREX) 1000 MG tablet As needed for cold sores 07/18/20   [provider]      Allergies    Bactrim [sulfamethoxazole-trimethoprim], Oxycodone, and Penicillins    Review of Systems   Review of Systems  Physical Exam Updated Vital Signs BP 103/61    Pulse 74    Temp 97.8 F (36.6 C) (Oral)    Resp 19    LMP 06/06/2021 (Approximate)    SpO2 100%  Physical Exam Constitutional:      General: She is not in acute distress. HENT:     Head: Normocephalic and atraumatic.  Eyes:     Conjunctiva/sclera: Conjunctivae normal.     Pupils: Pupils are equal, round, and reactive to light.  Neck:     Comments: Oropharynx non-erythematous.  No tonsillar swelling or exudate.  No uvular deviation.  No drooling. No brawny edema. No stridor. Voice is not muffled.   Cardiovascular:     Rate and Rhythm: Normal rate and regular rhythm.  Pulmonary:  Effort: Pulmonary effort is normal. No respiratory distress.  Abdominal:     General: There is no distension.     Tenderness: There is no abdominal tenderness.  Skin:    General: Skin is warm and dry.     Comments: Diffuse, maculopapular rash, blanching, nonpruritic, nonraised, involving the chest, abdomen, extremities  Neurological:     General: No focal deficit present.     Mental Status: She is alert. Mental status is at baseline.  Psychiatric:        Mood and Affect: Mood normal.        Behavior: Behavior normal.    ED Results / Procedures / Treatments   Labs (all labs ordered are listed, but only abnormal results are displayed) Labs Reviewed  COMPREHENSIVE  METABOLIC PANEL - Abnormal; Notable for the following components:      Result Value   Calcium 8.7 (*)    All other components within normal limits  URINALYSIS, ROUTINE W REFLEX MICROSCOPIC - Abnormal; Notable for the following components:   Hgb urine dipstick MODERATE (*)    Protein, ur 30 (*)    All other components within normal limits  CBG MONITORING, ED - Abnormal; Notable for the following components:   Glucose-Capillary 121 (*)    All other components within normal limits  RESP PANEL BY RT-PCR (FLU A&B, COVID) ARPGX2  CULTURE, BLOOD (SINGLE)  LACTIC ACID, PLASMA  CBC WITH DIFFERENTIAL/PLATELET    EKG EKG Interpretation  Date/Time:  Friday July 10 2021 20:10:59 EST Ventricular Rate:  89 PR Interval:  158 QRS Duration: 137 QT Interval:  418 QTC Calculation: 509 R Axis:   40 Text Interpretation: Sinus rhythm Confirmed by Octaviano Glow [phone]) on 07/10/2021 9:06:11 PM  Radiology DG Chest 2 View  Result Date: 07/10/2021 CLINICAL DATA:  Fever. EXAM: CHEST - 2 VIEW COMPARISON:  May 17, 2016. FINDINGS: The heart size and mediastinal contours are within normal limits. Both lungs are clear. The visualized skeletal structures are unremarkable. IMPRESSION: No active cardiopulmonary disease. Electronically Signed   By: Marijo Conception M.D.   On: 07/10/2021 13:10    Procedures Procedures    Medications Ordered in ED Medications - No data to display  ED Course/ Medical Decision Making/ A&P Clinical Course as of 07/11/21 0005  Fri Jul 10, 2021  1814 OBGYN who did hysterectomy called me regarding patient. Apparently she was recently placed on bactrim due to a possible cellulitis but this was overall unimpressive. She has been recovering well from surgery. She wants to be updated on patient, but it is unclear if symptoms are related to hysterectomy. [GL]    Clinical Course User Index [GL] Sherre Poot Adora Fridge, PA-C                           Medical Decision Making Amount  and/or Complexity of Data Reviewed Labs: ordered.   This patient presents to the ED with concern for rash, fatigue, fever.. This involves an extensive number of treatment options, and is a complaint that carries with it a high risk of complications and morbidity.  The differential diagnosis includes viral syndrome and viral rash versus allergic reaction to drugs versus other complication  Additional history obtained from patient's husband at bedside  External records from outside source obtained and reviewed including operative course and discharge summary from December for patient's hysterectomy  I ordered and personally interpreted labs.  The pertinent results include: Normal white blood cell count,  normal lactate, UA without sign of infection  I ordered imaging studies including the chest I independently visualized and interpreted imaging which showed no focal findings, no acute pneumonia I agree with the radiologist interpretation  The patient was maintained on a cardiac monitor.  I personally viewed and interpreted the cardiac monitored which showed an underlying rhythm of: Sinus rhythm  Per my interpretation the patient's ECG shows sinus rhythm with no acute ischemic findings  I have reviewed the patients home medicines and have made adjustments as needed.  Specifically, advised that she stop her Bactrim at this time, as there is no evidence of continued cellulitis or skin infection on exam, and it is not clear whether she may be having a reaction to the sulfa medication with this rash.  Test Considered:  -Abdominal exam is benign, I doubt acute intra-abdominal abscess, do not feel CT of the abdomen was necessary at this time  After the interventions noted above, I reevaluated the patient and found that they have: stayed the same   Dispostion:  After consideration of the diagnostic results and the patients response to treatment, I feel that the patent would benefit from outpatient  PCP f/u.  I felt it was reasonable to obtain a set of blood cultures to rule out bacteremia, given her surgical procedure a month ago.  Overall explained that my suspicion is largely towards a viral syndrome.  Explained her blood tests are reassuring, she does not have SIRS criteria and she has a normal lactate, which lowers my clinical concern for sepsis.  With the patient and her husband are agreeable to having a blood culture drawn, they understand that if there are cultures positive, they will need to come back to the ER immediately for IV antibiotics.         Final Clinical Impression(s) / ED Diagnoses Final diagnoses:  Rash  Fever, unspecified fever cause    Rx / DC Orders ED Discharge Orders     None         Azaiah Mello, Carola Rhine, MD 07/11/21 0005

## 2021-07-10 NOTE — ED Triage Notes (Signed)
Patient reports fever, chills, low BP, body aches and a rash since Thursday. She had a hysterectomy about 4 weeks ago. A week ago the incision was rechecked. Antibiotic was prescribed.

## 2021-07-10 NOTE — ED Provider Triage Note (Signed)
Emergency Medicine Provider Triage Evaluation Note  Leslie Esparza , a 51 y.o. female  was evaluated in triage.  Pt complains of body aches, fatigue, and chills.  Patient states she had a recent hysterectomy done at the end of December.  She is overall been healing well from this.  She was placed on an antibiotic about 1 week ago for possible incision site infection.  She has been taking this and took her last dose last night.  Yesterday afternoon is when she started having the symptoms.  She said she felt feverish, but did not have a fever at home.  Her other concerning symptom is that her blood pressures been lower than normal and experiencing systolic blood pressure of in the 80s at home.  She has felt like she is going to pass out at times.  Denies any loss of consciousness episodes.  She also states that she developed a rash that began yesterday afternoon.  The rash is on her torso, bilateral upper extremities, bilateral lower extremities.  He is kind of itchy.  The rash has not improved since stopping antibiotic.  Patient denies any abdominal pain, nausea, vomiting, diarrhea, chest pain, shortness of breath.  Review of Systems  Positive: Chills, myalgias, congestion, rash, fatigue Negative: Abdominal pain  Physical Exam  BP 99/69    Pulse 90    Temp 98.8 F (37.1 C) (Oral)    Resp 18    LMP 06/06/2021 (Approximate)    SpO2 100%  Gen:   Awake, no distress   Resp:  Normal effort  MSK:   Moves extremities without difficulty  Other:  Rash is diffusely erythematous, macular and seen on torso and bilateral upper extremities.  Does not appear to be raised or have the appearance of hives.  Medical Decision Making  Medically screening exam initiated at 12:42 PM.  Appropriate orders placed.  [name] was informed that the remainder of the evaluation will be completed by another provider, this initial triage assessment does not replace that evaluation, and the importance of remaining in the  ED until their evaluation is complete.  Resp panel, CBC, CMP, lactate, chest xr, EKG, and UA ordered.    Adolphus Birchwood, PA-C 07/10/21 1250

## 2021-07-10 NOTE — Discharge Instructions (Signed)
Your rash may be result of a viral illness, or else an allergic reaction to your Bactrim.  I felt it was safe for you to stop taking the Bactrim at this time, and I have listed it as a possible allergy in your medical chart.  I would recommend you follow-up with your OB/GYN or your PCP this week for another recheck on your rash and how you are feeling.  If your blood pressure is low in the morning, with your top number below 100, you should not take your morning metoprolol.  Please continue drinking plenty of water.  We talked about the very small possibility that you may in fact have a blood infection called bacteremia.  We did send a set of blood cultures to look for this.  If you do get a phone call the next day or 2 telling you to have a positive culture, it is extremely poor and that you come back immediately to the ER.  You will need IV antibiotics at that time.  Otherwise, if you begin to feel lightheaded, confused, lethargic, or have any other concerns for medical emergency, please return to the ER immediately.

## 2021-07-16 LAB — CULTURE, BLOOD (SINGLE): Culture: NO GROWTH

## 2021-07-17 ENCOUNTER — Other Ambulatory Visit: Payer: Self-pay | Admitting: *Deleted

## 2021-07-17 MED ORDER — METOPROLOL SUCCINATE ER 25 MG PO TB24: 25.0000 mg | ORAL_TABLET | Freq: Every day | ORAL | 1 refills | Status: DC

## 2021-07-17 MED ORDER — ENTRESTO 49-51 MG PO TABS: 1.0000 | ORAL_TABLET | Freq: Two times a day (BID) | ORAL | 1 refills | Status: DC

## 2021-09-04 ENCOUNTER — Ambulatory Visit (INDEPENDENT_AMBULATORY_CARE_PROVIDER_SITE_OTHER): Payer: 59 | Admitting: Cardiology

## 2021-09-04 ENCOUNTER — Other Ambulatory Visit: Payer: Self-pay

## 2021-09-04 ENCOUNTER — Encounter: Payer: Self-pay | Admitting: Cardiology

## 2021-09-04 VITALS — BP 110/66 | HR 65 | Ht 61.5 in | Wt 139.0 lb

## 2021-09-04 DIAGNOSIS — I10 Essential (primary) hypertension: Secondary | ICD-10-CM | POA: Diagnosis not present

## 2021-09-04 DIAGNOSIS — I428 Other cardiomyopathies: Secondary | ICD-10-CM

## 2021-09-04 NOTE — Patient Instructions (Signed)
Medication Instructions:  ? ?Your physician recommends that you continue on your current medications as directed. Please refer to the Current Medication list given to you today. ? ?*If you need a refill on your cardiac medications before your next appointment, please call your pharmacy* ? ? ?Lab Work: ? ?None Ordered ? ?If you have labs (blood work) drawn today and your tests are completely normal, you will receive your results only by: ?MyChart Message (if you have MyChart) OR ?A paper copy in the mail ?If you have any lab test that is abnormal or we need to change your treatment, we will call you to review the results. ? ? ?Testing/Procedures: ? ?Echocardiogram in 6 months  ? ?Please return to [facility] on ______________ at _______________ AM/PM for an Echocardiogram. ? ?Your physician has requested that you have an echocardiogram. Echocardiography is a painless test that uses sound waves to create images of your heart. It provides your doctor with information about the size and shape of your heart and how well your heart?s chambers and valves are working. This procedure takes approximately one hour. There are no restrictions for this procedure. Please note; depending on visual quality an IV may need to be placed.  ? ? ?Follow-Up: ?At [facility], you and your health needs are our priority.  As part of our continuing mission to provide you with exceptional heart care, we have created designated Provider Care Teams.  These Care Teams include your primary Cardiologist (physician) and Advanced Practice Providers (APPs -  Physician Assistants and Nurse Practitioners) who all work together to provide you with the care you need, when you need it. ? ?We recommend signing up for the patient portal called "MyChart".  Sign up information is provided on this After Visit Summary.  MyChart is used to connect with patients for Virtual Visits (Telemedicine).  Patients are able to view lab/test results,  encounter notes, upcoming appointments, etc.  Non-urgent messages can be sent to your provider as well.   ?To learn more about what you can do with MyChart, go to [email or web address].   ? ?Your next appointment:   ?In 6 month(s) -- make sure this is after the ECHO please.  ? ?The format for your next appointment:   ?In Person ? ?Provider:   ?You may see Kate Sable, MD or one of the following Advanced Practice Providers on your designated Care Team:   ?Murray Hodgkins, NP ?Christell Faith, PA-C ? ?

## 2021-09-04 NOTE — Progress Notes (Signed)
?Cardiology Office Note:   ? ?Date:  09/04/2021  ? ?ID:  Leslie Esparza, DOB 1970/06/20, MRN [id number] ? ?PCP:  Cyndi Bender, PA-C  ?[facility] Cardiologist:  Kate Sable, MD  ?[facility] Electrophysiologist:  None  ? ?Referring MD: Cyndi Bender, PA-C  ? ?Chief Complaint  ?Patient presents with  ? Other  ?  6 month follow up -- Meds reviewed verbally with patient.   ? ? ? ?History of Present Illness:   ? ?Leslie Esparza is a 51 y.o. female with a hx of NICM (initial EF 35%, last echo EF 45 to 50%), hypertension, hyperlipidemia, who presents for follow-up.   ? ?Patient being seen for nonischemic cardiomyopathy, and medication titration.  Tolerating current doses of Entresto and Toprol-XL.  Low blood pressures preventing up titration of CHF medications.  Last echocardiogram showed EF 45 to 50%.  Denies edema, denies chest pain.   ? ? ?Prior notes ?Echo 02/2021 EF 45 to 50% ? She underwent left heart cath on 02/11/2020 with no evidence of CAD.  Echo on 02/01/2020 showed moderately reduced EF, 35 to 40%.  ?CMR 03/2020 EF 57% ?Echo 10/02/2020 EF 30 to 35% ? ? ?Past Medical History:  ?Diagnosis Date  ? Allergic rhinitis   ? Bilateral carpal tunnel syndrome   ? CHF (congestive heart failure) ([place])   ? ef 45 to 50 % per last echo 03-03-2021  ? Common migraine   ? Complication of anesthesia   ? itching after c section  ? History of COVID-19 11/04/2020  ? x 3 days all symptoms resolved  ? Hypertension   ? Multiple thyroid nodules   ? monitored by [facility], thryoid cysts also  ? Nonischemic cardiomyopathy ([place])   ? OCD (obsessive compulsive disorder)   ? Osteoarthritis of thumbs   ? Pelvic pain   ? Recurrent cold sores   ? ? ?Past Surgical History:  ?Procedure Laterality Date  ? ABDOMINAL HYSTERECTOMY    ? CESAREAN SECTION    ? x2 2000 and 2004  ? CYSTOSCOPY N/A 06/12/2021  ? Procedure: CYSTOSCOPY;  Surgeon: Waymon Amato, MD;  Location: [facility];  Service:  Gynecology;  Laterality: N/A;  ? LAPAROSCOPIC HYSTERECTOMY N/A 06/12/2021  ? Procedure: HYSTERECTOMY TOTAL LAPAROSCOPIC;  Surgeon: Waymon Amato, MD;  Location: [place];  Service: Gynecology;  Laterality: N/A;  ? LEFT HEART CATH AND CORONARY ANGIOGRAPHY Left 02/11/2020  ? Procedure: LEFT HEART CATH AND CORONARY ANGIOGRAPHY;  Surgeon: Wellington Hampshire, MD;  Location: [place] [facility];  Service: Cardiovascular;  Laterality: Left;  ? WISDOM TOOTH EXTRACTION    ? yrs ago per pt on 06-03-2021  ? ? ?Current Medications: ?Current Meds  ?Medication Sig  ? Cholecalciferol (VITAMIN D) 125 MCG (5000 UT) CAPS Take 5,000 Units by mouth daily.  ? estradiol (ESTRACE) 0.1 MG/GM vaginal cream PLEASE SEE ATTACHED FOR DETAILED DIRECTIONS  ? FLUoxetine (PROZAC) 40 MG capsule Take 80 mg by mouth daily.  ? HYDROcodone-acetaminophen (NORCO/VICODIN) 5-325 MG tablet Take 1-2 tablets by mouth every 6 (six) hours as needed for severe pain or moderate pain.  ? ibuprofen (ADVIL) 800 MG tablet Take 1 tablet (800 mg total) by mouth every 8 (eight) hours.  ? metoprolol succinate (TOPROL XL) 25 MG 24 hr tablet Take 1 tablet (25 mg total) by mouth daily. MUST KEEP SCHEDULED APPOINTMENT  ? sacubitril-valsartan (ENTRESTO) 49-51 MG Take 1 tablet by mouth 2 (two) times daily. MUST KEEP SCHEDULED APPOINTMENT.  ?  valACYclovir (VALTREX) 1000 MG tablet As needed for cold sores  ?  ? ?Allergies:   Bactrim [sulfamethoxazole-trimethoprim], Oxycodone, and Penicillins  ? ?Social History  ? ?Socioeconomic History  ? Marital status: Married  ?  Spouse name: Not on file  ? Number of children: 2  ? Years of education: Not on file  ? Highest education level: Not on file  ?Occupational History  ? Not on file  ?Tobacco Use  ? Smoking status: Never  ? Smokeless tobacco: Never  ?Vaping Use  ? Vaping Use: Never used  ?Substance and Sexual Activity  ? Alcohol use: No  ?  Alcohol/week: 0.0 standard drinks  ? Drug use: No  ? Sexual activity: Not on file   ?Other Topics Concern  ? Not on file  ?Social History Narrative  ? Lives at home with husband and children  ? Right handed  ? Caffeine: none currently  ? ?Social Determinants of Health  ? ?Financial Resource Strain: Not on file  ?Food Insecurity: Not on file  ?Transportation Needs: Not on file  ?Physical Activity: Not on file  ?Stress: Not on file  ?Social Connections: Not on file  ?  ? ?Family History: ?The patient's family history includes Heart disease in her paternal grandfather; Migraines in her brother and mother; Thyroid disease in her father. There is no history of Stroke or Transient ischemic attack. ? ?ROS:   ?Please see the history of present illness.    ? All other systems reviewed and are negative. ? ?EKGs/Labs/Other Studies Reviewed:   ? ?The following studies were reviewed today: ? ? ?EKG:  EKG is ordered today.  EKG shows normal sinus rhythm, anterior infarct. ? ?Recent Labs: ?07/10/2021: ALT 30; BUN 8; Creatinine, Ser 0.61; Hemoglobin 12.6; Platelets 225; Potassium 3.8; Sodium 135  ?Recent Lipid Panel ?   ?Component Value Date/Time  ? CHOL 155 04/24/2020 0431  ? TRIG 78 04/24/2020 0431  ? HDL 43 04/24/2020 0431  ? CHOLHDL 3.6 04/24/2020 0431  ? VLDL 16 04/24/2020 0431  ? [place] 96 04/24/2020 0431  ? ? ?Physical Exam:   ? ?VS:  BP 110/66 (BP Location: Left Arm, Patient Position: Sitting, Cuff Size: Normal)   Pulse 65   Ht 5' 1.5" (1.562 m)   Wt 139 lb (63 kg)   LMP 06/06/2021 (Approximate)   BMI 25.84 kg/m?    ? ?Wt Readings from Last 3 Encounters:  ?09/04/21 139 lb (63 kg)  ?06/12/21 134 lb 11.2 oz (61.1 kg)  ?06/09/21 137 lb (62.1 kg)  ?  ? ?GEN:  Well nourished, well developed in no acute distress ?HEENT: Normal ?NECK: No JVD; No carotid bruits ?CARDIAC: RRR, no murmurs, rubs, gallops ?RESPIRATORY:  Clear to auscultation without rales, wheezing or rhonchi  ?ABDOMEN: Soft, non-tender, non-distended ?MUSCULOSKELETAL:  No edema; No deformity  ?SKIN: Warm and dry ?NEUROLOGIC:  Alert and oriented  x 3 ?PSYCHIATRIC:  Normal affect  ? ?ASSESSMENT:   ? ?1. NICM (nonischemic cardiomyopathy) ([place])   ?2. Primary hypertension   ? ?PLAN:   ? ?In order of problems listed above: ?   ?Nonischemic cardiomyopathy, initial EF 35%.  Repeat echo 02/2021 EF 45 to 50%.  Cardiac MRI 03/2020 showed EF 57%.  No evidence for scar or infiltrative disease. Cont Toprol-XL 25 mg daily, Entresto 49-51 mg twice daily.  Repeat echo in 6 months, which will be 1 year from prior echo. ? ?Hx of hypertension, BP controlled.  Continue Toprol-XL and Entresto. ? ? ?Follow-up in 6 months ? ?  Total encounter time 35 minutes ? Greater than 50% was spent in counseling and coordination of care with the patient ? ? ?This note was generated in part or whole with voice recognition software. Voice recognition is usually quite accurate but there are transcription errors that can and very often do occur. I apologize for any typographical errors that were not detected and corrected. ? ?Medication Adjustments/Labs and Tests Ordered: ?Current medicines are reviewed at length with the patient today.  Concerns regarding medicines are outlined above.  ?Orders Placed This Encounter  ?Procedures  ? EKG 12-Lead  ? ECHOCARDIOGRAM COMPLETE  ? ? ? ?No orders of the defined types were placed in this encounter. ? ? ? ?Patient Instructions  ?Medication Instructions:  ? ?Your physician recommends that you continue on your current medications as directed. Please refer to the Current Medication list given to you today. ? ?*If you need a refill on your cardiac medications before your next appointment, please call your pharmacy* ? ? ?Lab Work: ? ?None Ordered ? ?If you have labs (blood work) drawn today and your tests are completely normal, you will receive your results only by: ?MyChart Message (if you have MyChart) OR ?A paper copy in the mail ?If you have any lab test that is abnormal or we need to change your treatment, we will call you to review the  results. ? ? ?Testing/Procedures: ? ?Echocardiogram in 6 months  ? ?Please return to [facility] on ______________ at _______________ AM/PM for an Echocardiogram. ? ?Your physician has requested that you have an echoca

## 2021-10-06 ENCOUNTER — Other Ambulatory Visit: Payer: Self-pay

## 2021-10-06 MED ORDER — ENTRESTO 49-51 MG PO TABS: 1.0000 | ORAL_TABLET | Freq: Two times a day (BID) | ORAL | 4 refills | Status: DC

## 2021-11-13 ENCOUNTER — Other Ambulatory Visit: Payer: Self-pay | Admitting: *Deleted

## 2021-11-13 MED ORDER — METOPROLOL SUCCINATE ER 25 MG PO TB24: 25.0000 mg | ORAL_TABLET | Freq: Every day | ORAL | 3 refills | Status: DC

## 2022-01-07 ENCOUNTER — Ambulatory Visit (HOSPITAL_BASED_OUTPATIENT_CLINIC_OR_DEPARTMENT_OTHER): Admission: RE | Admit: 2022-01-07 | Payer: 59 | Source: Home / Self Care | Admitting: Orthopedic Surgery

## 2022-01-07 DIAGNOSIS — G8918 Other acute postprocedural pain: Secondary | ICD-10-CM | POA: Insufficient documentation

## 2022-01-07 SURGERY — CARPOMETACARPAL (CMC) FUSION OF THUMB
Anesthesia: Regional | Site: Thumb | Laterality: Left

## 2022-01-21 ENCOUNTER — Ambulatory Visit: Payer: 59 | Admitting: Internal Medicine

## 2022-02-01 DIAGNOSIS — I428 Other cardiomyopathies: Secondary | ICD-10-CM | POA: Insufficient documentation

## 2022-02-08 ENCOUNTER — Ambulatory Visit: Payer: 59 | Admitting: Internal Medicine

## 2022-02-08 DIAGNOSIS — I428 Other cardiomyopathies: Secondary | ICD-10-CM

## 2022-02-08 DIAGNOSIS — I5020 Unspecified systolic (congestive) heart failure: Secondary | ICD-10-CM

## 2022-02-11 ENCOUNTER — Telehealth: Payer: Self-pay

## 2022-02-11 MED ORDER — METOPROLOL SUCCINATE ER 25 MG PO TB24: 25.0000 mg | ORAL_TABLET | Freq: Every day | ORAL | 0 refills | Status: DC

## 2022-02-11 NOTE — Telephone Encounter (Signed)
Metoprolol refill

## 2022-03-08 ENCOUNTER — Other Ambulatory Visit: Payer: Self-pay

## 2022-03-08 MED ORDER — ENTRESTO 49-51 MG PO TABS: 1.0000 | ORAL_TABLET | Freq: Two times a day (BID) | ORAL | 0 refills | Status: DC

## 2022-03-09 ENCOUNTER — Ambulatory Visit: Payer: 59 | Attending: Cardiology

## 2022-03-09 DIAGNOSIS — I428 Other cardiomyopathies: Secondary | ICD-10-CM | POA: Diagnosis not present

## 2022-03-09 LAB — ECHOCARDIOGRAM COMPLETE
AR max vel: 1.88 cm2
AV Area VTI: 2 cm2
AV Area mean vel: 1.91 cm2
AV Mean grad: 2 mmHg
AV Peak grad: 5.3 mmHg
Ao pk vel: 1.15 m/s
Area-P 1/2: 5.97 cm2
Calc EF: 46.1 %
S' Lateral: 3.6 cm
Single Plane A2C EF: 45.9 %
Single Plane A4C EF: 47.2 %

## 2022-03-12 ENCOUNTER — Ambulatory Visit: Payer: 59 | Attending: Cardiology | Admitting: Cardiology

## 2022-03-12 ENCOUNTER — Encounter: Payer: Self-pay | Admitting: Cardiology

## 2022-03-12 VITALS — BP 110/66 | HR 63 | Ht 61.0 in | Wt 144.6 lb

## 2022-03-12 DIAGNOSIS — I10 Essential (primary) hypertension: Secondary | ICD-10-CM

## 2022-03-12 DIAGNOSIS — I428 Other cardiomyopathies: Secondary | ICD-10-CM | POA: Diagnosis not present

## 2022-03-12 MED ORDER — ENTRESTO 49-51 MG PO TABS: 1.0000 | ORAL_TABLET | Freq: Two times a day (BID) | ORAL | 5 refills | Status: DC

## 2022-03-12 MED ORDER — METOPROLOL SUCCINATE ER 25 MG PO TB24: 25.0000 mg | ORAL_TABLET | Freq: Every day | ORAL | 1 refills | Status: DC

## 2022-03-12 NOTE — Patient Instructions (Signed)
Medication Instructions:   Your physician recommends that you continue on your current medications as directed. Please refer to the Current Medication list given to you today. ]*If you need a refill on your cardiac medications before your next appointment, please call your pharmacy*    Follow-Up: At [facility], you and your health needs are our priority.  As part of our continuing mission to provide you with exceptional heart care, we have created designated Provider Care Teams.  These Care Teams include your primary Cardiologist (physician) and Advanced Practice Providers (APPs -  Physician Assistants and Nurse Practitioners) who all work together to provide you with the care you need, when you need it.  We recommend signing up for the patient portal called "MyChart".  Sign up information is provided on this After Visit Summary.  MyChart is used to connect with patients for Virtual Visits (Telemedicine).  Patients are able to view lab/test results, encounter notes, upcoming appointments, etc.  Non-urgent messages can be sent to your provider as well.   To learn more about what you can do with MyChart, go to [email or web address].    Your next appointment:   6 month(s)  The format for your next appointment:   In Person  Provider:   Kate Sable, MD    Other Instructions  **  Schedule follow up apt with Dr. Haroldine Laws   Important Information About Sugar

## 2022-03-12 NOTE — Progress Notes (Signed)
Cardiology Office Note:    Date:  03/12/2022   ID:  Leslie Esparza, DOB Dec 19, 1970, MRN [id number]  PCP:  Cyndi Bender, PA-C  [facility] Cardiologist:  Kate Sable, MD  [facility] Electrophysiologist:  None   Referring MD: Cyndi Bender, PA-C   Chief Complaint  Patient presents with   Follow-up    6 month f/u, no new cardiac concerns     History of Present Illness:    Leslie Esparza is a 52 y.o. female with a hx of NICM (initial EF 35%, last echo EF 45 to 50%), hypertension, hyperlipidemia, who presents for follow-up.    Patient being seen for nonischemic cardiomyopathy, and medication titration.  Tolerating current doses of Entresto and Toprol-XL.  Low blood pressures preventing up titration of CHF medications.  Was significantly hypovolemic with taking Aldactone.  Last echocardiogram showed EF 45 to 50%.  Denies edema, denies chest pain.  She endorses having carpal tunnel syndrome and back pain.   Prior notes Echo 02/2021 EF 45 to 50%  She underwent left heart cath on 02/11/2020 with no evidence of CAD.  Echo on 02/01/2020 showed moderately reduced EF, 35 to 40%.  CMR 03/2020 EF 57% Echo 10/02/2020 EF 30 to 35%    Past Medical History:  Diagnosis Date   Allergic rhinitis    Bilateral carpal tunnel syndrome    CHF (congestive heart failure) ([place])    ef 45 to 50 % per last echo 03-03-2021   Common migraine    Complication of anesthesia    itching after c section   History of COVID-19 11/04/2020   x 3 days all symptoms resolved   Hypertension    Multiple thyroid nodules    monitored by pcp nathan conroy [facility], thryoid cysts also   Nonischemic cardiomyopathy ([place])    OCD (obsessive compulsive disorder)    Osteoarthritis of thumbs    Pelvic pain    Recurrent cold sores     Past Surgical History:  Procedure Laterality Date   ABDOMINAL HYSTERECTOMY     CESAREAN SECTION     x2 2000 and 2004   CYSTOSCOPY N/A 06/12/2021    Procedure: CYSTOSCOPY;  Surgeon: Waymon Amato, MD;  Location: [place];  Service: Gynecology;  Laterality: N/A;   LAPAROSCOPIC HYSTERECTOMY N/A 06/12/2021   Procedure: HYSTERECTOMY TOTAL LAPAROSCOPIC;  Surgeon: Waymon Amato, MD;  Location: [place];  Service: Gynecology;  Laterality: N/A;   LEFT HEART CATH AND CORONARY ANGIOGRAPHY Left 02/11/2020   Procedure: LEFT HEART CATH AND CORONARY ANGIOGRAPHY;  Surgeon: Wellington Hampshire, MD;  Location: [place] [facility];  Service: Cardiovascular;  Laterality: Left;   WISDOM TOOTH EXTRACTION     yrs ago per pt on 06-03-2021    Current Medications: Current Meds  Medication Sig   Cholecalciferol (VITAMIN D) 125 MCG (5000 UT) CAPS Take 5,000 Units by mouth daily.   estradiol (ESTRACE) 0.1 MG/GM vaginal cream as needed.   FLUoxetine (PROZAC) 40 MG capsule Take 80 mg by mouth daily.   [DISCONTINUED] metoprolol succinate (TOPROL XL) 25 MG 24 hr tablet Take 1 tablet (25 mg total) by mouth daily. Please contact office for appointment   [DISCONTINUED] sacubitril-valsartan (ENTRESTO) 49-51 MG Take 1 tablet by mouth 2 (two) times daily.     Allergies:   Bactrim [sulfamethoxazole-trimethoprim], Oxycodone, and Penicillins   Social History   Socioeconomic History   Marital status: Married    Spouse name: Not on file   Number  of children: 2   Years of education: Not on file   Highest education level: Not on file  Occupational History   Not on file  Tobacco Use   Smoking status: Never   Smokeless tobacco: Never  Vaping Use   Vaping Use: Never used  Substance and Sexual Activity   Alcohol use: No    Alcohol/week: 0.0 standard drinks of alcohol   Drug use: No   Sexual activity: Not on file  Other Topics Concern   Not on file  Social History Narrative   Lives at home with husband and children   Right handed   Caffeine: none currently   Social Determinants of Health   Financial Resource Strain: Not on file   Food Insecurity: Not on file  Transportation Needs: Not on file  Physical Activity: Not on file  Stress: Not on file  Social Connections: Not on file     Family History: The patient's family history includes Heart disease in her paternal grandfather; Migraines in her brother and mother; Thyroid disease in her father. There is no history of Stroke or Transient ischemic attack.  ROS:   Please see the history of present illness.     All other systems reviewed and are negative.  EKGs/Labs/Other Studies Reviewed:    The following studies were reviewed today:   EKG:  EKG is ordered today.  EKG shows normal sinus rhythm, anterior infarct.  Recent Labs: 07/10/2021: ALT 30; BUN 8; Creatinine, Ser 0.61; Hemoglobin 12.6; Platelets 225; Potassium 3.8; Sodium 135  Recent Lipid Panel    Component Value Date/Time   CHOL 155 04/24/2020 0431   TRIG 78 04/24/2020 0431   HDL 43 04/24/2020 0431   CHOLHDL 3.6 04/24/2020 0431   VLDL 16 04/24/2020 0431   LDLCALC 96 04/24/2020 0431    Physical Exam:    VS:  BP 110/66 (BP Location: Left Arm, Patient Position: Sitting, Cuff Size: Normal)   Pulse 63   Ht '5\' 1"'$  (1.549 m)   Wt 144 lb 9.6 oz (65.6 kg)   LMP 06/06/2021 (Approximate)   SpO2 99%   BMI 27.32 kg/m     Wt Readings from Last 3 Encounters:  03/12/22 144 lb 9.6 oz (65.6 kg)  09/04/21 139 lb (63 kg)  06/12/21 134 lb 11.2 oz (61.1 kg)     GEN:  Well nourished, well developed in no acute distress HEENT: Normal NECK: No JVD; No carotid bruits CARDIAC: RRR, no murmurs, rubs, gallops RESPIRATORY:  Clear to auscultation without rales, wheezing or rhonchi  ABDOMEN: Soft, non-tender, non-distended MUSCULOSKELETAL:  No edema; No deformity  SKIN: Warm and dry NEUROLOGIC:  Alert and oriented x 3 PSYCHIATRIC:  Normal affect   ASSESSMENT:    1. NICM (nonischemic cardiomyopathy) ([place])   2. Primary hypertension    PLAN:    In order of problems listed above:    Nonischemic  cardiomyopathy, initial EF 35%.  Repeat echo 02/2022 EF 45 to 50%, no significant change from prior echo.  02/2021 EF 45 to 50%.  Cont Toprol-XL 25 mg daily, Entresto 49-51 mg twice daily.  Low normal BP preventing up titration of GDMT.  Due to symptoms of carpal tunnel syndrome, will refer to advanced heart failure team to consider TTR amyloid evaluation with pyrophosphate scan.  Hx of hypertension, BP controlled.  Continue Toprol-XL and Entresto.   Follow-up in 6 months  Total encounter time 35 minutes  Greater than 50% was spent in counseling and coordination of care with the  patient   This note was generated in part or whole with voice recognition software. Voice recognition is usually quite accurate but there are transcription errors that can and very often do occur. I apologize for any typographical errors that were not detected and corrected.  Medication Adjustments/Labs and Tests Ordered: Current medicines are reviewed at length with the patient today.  Concerns regarding medicines are outlined above.  Orders Placed This Encounter  Procedures   EKG 12-Lead     Meds ordered this encounter  Medications   metoprolol succinate (TOPROL XL) 25 MG 24 hr tablet    Sig: Take 1 tablet (25 mg total) by mouth daily.    Dispense:  90 tablet    Refill:  1   sacubitril-valsartan (ENTRESTO) 49-51 MG    Sig: Take 1 tablet by mouth 2 (two) times daily.    Dispense:  60 tablet    Refill:  5     Patient Instructions  Medication Instructions:   Your physician recommends that you continue on your current medications as directed. Please refer to the Current Medication list given to you today. ]*If you need a refill on your cardiac medications before your next appointment, please call your pharmacy*    Follow-Up: At [facility], you and your health needs are our priority.  As part of our continuing mission to provide you with exceptional heart care, we have created designated  Provider Care Teams.  These Care Teams include your primary Cardiologist (physician) and Advanced Practice Providers (APPs -  Physician Assistants and Nurse Practitioners) who all work together to provide you with the care you need, when you need it.  We recommend signing up for the patient portal called "MyChart".  Sign up information is provided on this After Visit Summary.  MyChart is used to connect with patients for Virtual Visits (Telemedicine).  Patients are able to view lab/test results, encounter notes, upcoming appointments, etc.  Non-urgent messages can be sent to your provider as well.   To learn more about what you can do with MyChart, go to [email or web address].    Your next appointment:   6 month(s)  The format for your next appointment:   In Person  Provider:   Kate Sable, MD    Other Instructions  **  Schedule follow up apt with Dr. Haroldine Laws   Important Information About Sugar         Signed, Kate Sable, MD  03/12/2022 11:12 AM    [place]

## 2022-03-23 ENCOUNTER — Encounter: Payer: Self-pay | Admitting: *Deleted

## 2022-03-25 ENCOUNTER — Ambulatory Visit: Payer: 59 | Attending: Internal Medicine | Admitting: Internal Medicine

## 2022-03-25 ENCOUNTER — Encounter: Payer: Self-pay | Admitting: Internal Medicine

## 2022-03-25 VITALS — BP 100/72 | HR 67 | Ht 61.0 in | Wt 144.8 lb

## 2022-03-25 DIAGNOSIS — I447 Left bundle-branch block, unspecified: Secondary | ICD-10-CM | POA: Diagnosis not present

## 2022-03-25 DIAGNOSIS — I428 Other cardiomyopathies: Secondary | ICD-10-CM | POA: Diagnosis not present

## 2022-03-25 DIAGNOSIS — G43009 Migraine without aura, not intractable, without status migrainosus: Secondary | ICD-10-CM | POA: Insufficient documentation

## 2022-03-25 DIAGNOSIS — F410 Panic disorder [episodic paroxysmal anxiety] without agoraphobia: Secondary | ICD-10-CM | POA: Insufficient documentation

## 2022-03-25 DIAGNOSIS — D259 Leiomyoma of uterus, unspecified: Secondary | ICD-10-CM | POA: Insufficient documentation

## 2022-03-25 DIAGNOSIS — E041 Nontoxic single thyroid nodule: Secondary | ICD-10-CM | POA: Insufficient documentation

## 2022-03-25 DIAGNOSIS — I5020 Unspecified systolic (congestive) heart failure: Secondary | ICD-10-CM | POA: Diagnosis not present

## 2022-03-25 DIAGNOSIS — I10 Essential (primary) hypertension: Secondary | ICD-10-CM

## 2022-03-25 DIAGNOSIS — E782 Mixed hyperlipidemia: Secondary | ICD-10-CM

## 2022-03-25 DIAGNOSIS — M545 Low back pain, unspecified: Secondary | ICD-10-CM | POA: Insufficient documentation

## 2022-03-25 DIAGNOSIS — F429 Obsessive-compulsive disorder, unspecified: Secondary | ICD-10-CM | POA: Insufficient documentation

## 2022-03-25 NOTE — Patient Instructions (Signed)
Medication Instructions:  - Your physician recommends that you continue on your current medications as directed. Please refer to the Current Medication list given to you today.  *If you need a refill on your cardiac medications before your next appointment, please call your pharmacy*   Lab Work: - none ordered  If you have labs (blood work) drawn today and your tests are completely normal, you will receive your results only by: MyChart Message (if you have MyChart) OR A paper copy in the mail If you have any lab test that is abnormal or we need to change your treatment, we will call you to review the results.   Testing/Procedures: - none ordered   Follow-Up: At [facility], you and your health needs are our priority.  As part of our continuing mission to provide you with exceptional heart care, we have created designated Provider Care Teams.  These Care Teams include your primary Cardiologist (physician) and Advanced Practice Providers (APPs -  Physician Assistants and Nurse Practitioners) who all work together to provide you with the care you need, when you need it.  We recommend signing up for the patient portal called "MyChart".  Sign up information is provided on this After Visit Summary.  MyChart is used to connect with patients for Virtual Visits (Telemedicine).  Patients are able to view lab/test results, encounter notes, upcoming appointments, etc.  Non-urgent messages can be sent to your provider as well.   To learn more about what you can do with MyChart, go to [email or web address].    Your next appointment:   1 year(s)  The format for your next appointment:   In Person  Provider:   Steven Klein, MD    Other Instructions N/a  Important Information About Sugar       

## 2022-03-25 NOTE — Progress Notes (Signed)
Patient Care Team: Cyndi Bender, PA-C as PCP - General (Physician Assistant) Kate Sable, MD as PCP - Cardiology (Cardiology)   HPI  Leslie Esparza is a 51 y.o. female seen if followup for abnormal and progressively evolving (see below) ECG in setting of nonischemic cardiomyopathy  Doing well.  Not exercising but her ADLs no issues of chest pain or shortness of breath.  She notes that she has history of carpal tunnel and back pain.  She wonders whether she has amyloid.  She is following up with the heart failure clinic.  Her see MRI was not suggestive 2 years ago albeit   DATE TEST EF    8/21 Echo   35-40 %    8/21 LHC   % No Angiogra CAD  10/21 cMRI 57% LGE neg  11/21 Echo  35-45%     9/23 Echo   45-50%               Date Cr K TChol LDL Hgb  8/21     259 165    1/22 0.63 4.3 155 96 13.3   1/23 0.61 3.8     12.6<<11.1    Date PVCs  7/21 <1%  12/21 <1%    Date HR QRSd 1 L V6 EF  12/17   94 QR QR mono --  7/21 86 120 mono mono RS 35-40  10/21 70 128 mono mono RS 57  11/21 78 132 QR QR mono 40-45  2/22 72 130 QR QR mono    4/22 61 126 QR QR mono    10/23 63 114 mono mono mono 45-50  10/23(2) 85 114 QR QR mono     Records and Results Reviewed   Past Medical History:  Diagnosis Date   Allergic rhinitis    Bilateral carpal tunnel syndrome    CHF (congestive heart failure) (HCC)    ef 45 to 50 % per last echo 03-03-2021   Common migraine    Complication of anesthesia    itching after c section   History of COVID-19 11/04/2020   x 3 days all symptoms resolved   Hypertension    Multiple thyroid nodules    monitored by pcp nathan conroy pa [facility], thryoid cysts also   Nonischemic cardiomyopathy (HCC)    OCD (obsessive compulsive disorder)    Osteoarthritis of thumbs    Pelvic pain    Recurrent cold sores     Past Surgical History:  Procedure Laterality Date   ABDOMINAL HYSTERECTOMY     CESAREAN SECTION     x2 2000 and 2004    CYSTOSCOPY N/A 06/12/2021   Procedure: CYSTOSCOPY;  Surgeon: Waymon Amato, MD;  Location: [facility];  Service: Gynecology;  Laterality: N/A;   LAPAROSCOPIC HYSTERECTOMY N/A 06/12/2021   Procedure: HYSTERECTOMY TOTAL LAPAROSCOPIC;  Surgeon: Waymon Amato, MD;  Location: [facility];  Service: Gynecology;  Laterality: N/A;   LEFT HEART CATH AND CORONARY ANGIOGRAPHY Left 02/11/2020   Procedure: LEFT HEART CATH AND CORONARY ANGIOGRAPHY;  Surgeon: Wellington Hampshire, MD;  Location: [facility];  Service: Cardiovascular;  Laterality: Left;   WISDOM TOOTH EXTRACTION     yrs ago per pt on 06-03-2021    Current Meds  Medication Sig   Cholecalciferol (VITAMIN D) 125 MCG (5000 UT) CAPS Take 5,000 Units by mouth daily.   estradiol (ESTRACE) 0.1 MG/GM vaginal cream as needed.   FLUoxetine (PROZAC) 40 MG capsule  Take 80 mg by mouth daily.   HYDROcodone-acetaminophen (NORCO/VICODIN) 5-325 MG tablet Take 1-2 tablets by mouth every 6 (six) hours as needed for severe pain or moderate pain.   ibuprofen (ADVIL) 800 MG tablet Take 1 tablet (800 mg total) by mouth every 8 (eight) hours.   metoprolol succinate (TOPROL XL) 25 MG 24 hr tablet Take 1 tablet (25 mg total) by mouth daily.   sacubitril-valsartan (ENTRESTO) 49-51 MG Take 1 tablet by mouth 2 (two) times daily.   valACYclovir (VALTREX) 1000 MG tablet As needed for cold sores    Allergies  Allergen Reactions   Bactrim [Sulfamethoxazole-Trimethoprim] Rash    Diffuse rash in setting of bactrim use (5 days after initiation)   Penicillins Rash    had reaction as a child      Review of Systems negative except from HPI and PMH  Physical Exam BP 100/72   Pulse 67   Ht '5\' 1"'$  (1.549 m)   Wt 144 lb 12.8 oz (65.7 kg)   LMP 06/06/2021 (Approximate)   SpO2 97%   BMI 27.36 kg/m  Well developed and well nourished in no acute distress HENT normal E scleral and icterus clear Neck Supple JVP flat; carotids brisk and  full Clear to ausculation Regular rate and rhythm, no murmurs gallops or rub Soft with active bowel sounds No clubbing cyanosis  Edema Alert and oriented, grossly normal motor and sensory function Skin Warm and Dry  ECG sinus at 67  16/11/43  right axis at 117  CrCl cannot be calculated (Patient's most recent lab result is older than the maximum 21 days allowed.).   Assessment and  Plan Cardiomyopathy-nonischemic-mild  ECG-abnormal  ECG appears to be shifting her axis rightward and then not, and mostly, when she loses her right axis, the QRSs are a little wider and with a monophasic pattern in 1, L, I suspect that there is relative slowing of conduction over the left anterior fascicle matching the impedance of conduction over the left posterior fascicle giving rise to the right axis deviation.   I would anticipate at some point, that she will have progressive conduction delay in both her left bundles and if so if her heart muscle deteriorates at that juncture, then the hypothesis from Dr. Reine Just is left bundle branch block cardiomyopathy may well be the case.  I wonder also whether simply changing the axis can be enough to cause some left ventricular dysfunction.  The issue of amyloid with her conduction system disease, carpal tunnel, and back pain   Current medicines are reviewed at length with the patient today .  The patient does not  have concerns regarding medicines.

## 2022-04-02 ENCOUNTER — Ambulatory Visit (HOSPITAL_COMMUNITY)
Admission: RE | Admit: 2022-04-02 | Discharge: 2022-04-02 | Disposition: A | Discharge status: Home / Self Care | Payer: 59 | Source: Ambulatory Visit | Attending: Family Medicine | Admitting: Family Medicine

## 2022-04-02 ENCOUNTER — Encounter (HOSPITAL_COMMUNITY): Payer: Self-pay

## 2022-04-02 VITALS — BP 112/76 | HR 64 | Wt 144.6 lb

## 2022-04-02 DIAGNOSIS — Z8673 Personal history of transient ischemic attack (TIA), and cerebral infarction without residual deficits: Secondary | ICD-10-CM | POA: Diagnosis not present

## 2022-04-02 DIAGNOSIS — R0789 Other chest pain: Secondary | ICD-10-CM | POA: Insufficient documentation

## 2022-04-02 DIAGNOSIS — E785 Hyperlipidemia, unspecified: Secondary | ICD-10-CM | POA: Insufficient documentation

## 2022-04-02 DIAGNOSIS — Z79899 Other long term (current) drug therapy: Secondary | ICD-10-CM | POA: Diagnosis not present

## 2022-04-02 DIAGNOSIS — I447 Left bundle-branch block, unspecified: Secondary | ICD-10-CM

## 2022-04-02 DIAGNOSIS — M545 Low back pain, unspecified: Secondary | ICD-10-CM | POA: Diagnosis not present

## 2022-04-02 DIAGNOSIS — I5020 Unspecified systolic (congestive) heart failure: Secondary | ICD-10-CM

## 2022-04-02 DIAGNOSIS — I10 Essential (primary) hypertension: Secondary | ICD-10-CM

## 2022-04-02 DIAGNOSIS — G8929 Other chronic pain: Secondary | ICD-10-CM | POA: Insufficient documentation

## 2022-04-02 DIAGNOSIS — I428 Other cardiomyopathies: Secondary | ICD-10-CM | POA: Insufficient documentation

## 2022-04-02 DIAGNOSIS — G43009 Migraine without aura, not intractable, without status migrainosus: Secondary | ICD-10-CM | POA: Diagnosis not present

## 2022-04-02 DIAGNOSIS — R002 Palpitations: Secondary | ICD-10-CM | POA: Insufficient documentation

## 2022-04-02 DIAGNOSIS — I110 Hypertensive heart disease with heart failure: Secondary | ICD-10-CM | POA: Diagnosis not present

## 2022-04-02 DIAGNOSIS — Z8249 Family history of ischemic heart disease and other diseases of the circulatory system: Secondary | ICD-10-CM | POA: Insufficient documentation

## 2022-04-02 DIAGNOSIS — R42 Dizziness and giddiness: Secondary | ICD-10-CM | POA: Diagnosis not present

## 2022-04-02 DIAGNOSIS — I5022 Chronic systolic (congestive) heart failure: Secondary | ICD-10-CM | POA: Insufficient documentation

## 2022-04-02 LAB — BASIC METABOLIC PANEL
Anion gap: 14 (ref 5–15)
BUN: 10 mg/dL (ref 6–20)
CO2: 27 mmol/L (ref 22–32)
Calcium: 10.1 mg/dL (ref 8.9–10.3)
Chloride: 98 mmol/L (ref 98–111)
Creatinine, Ser: 0.58 mg/dL (ref 0.44–1.00)
GFR, Estimated: >60 mL/min (ref >60)
Glucose, Bld: 90 mg/dL (ref 70–99)
Potassium: 4 mmol/L (ref 3.5–5.1)
Sodium: 139 mmol/L (ref 135–145)

## 2022-04-02 NOTE — Patient Instructions (Signed)
Thank you for coming in today  Labs were done today, if any labs are abnormal the clinic will call you No news is good news  Your physician recommends that you schedule a follow-up appointment in:  6 months with Dr. Haroldine Laws    Do the following things EVERYDAY: Weigh yourself in the morning before breakfast. Write it down and keep it in a log. Take your medicines as prescribed Eat low salt foods--Limit salt (sodium) to 2000 mg per day.  Stay as active as you can everyday Limit all fluids for the day to less than 2 liters  If you have any questions or concerns before your next appointment please send [place] a message through [place] or call our office at [phone].    TO LEAVE A MESSAGE FOR THE NURSE SELECT OPTION 2, PLEASE LEAVE A MESSAGE INCLUDING: YOUR NAME DATE OF BIRTH CALL BACK NUMBER REASON FOR CALL**this is important as we prioritize the call backs  YOU WILL RECEIVE A CALL BACK THE SAME DAY AS LONG AS YOU CALL BEFORE 4:00 PM At the [facility], you and your health needs are our priority. As part of our continuing mission to provide you with exceptional heart care, we have created designated Provider Care Teams. These Care Teams include your primary Cardiologist (physician) and Advanced Practice Providers (APPs- Physician Assistants and Nurse Practitioners) who all work together to provide you with the care you need, when you need it.   You may see any of the following providers on your designated Care Team at your next follow up: Dr Glori Bickers Dr Loralie Champagne Dr. Roxana Hires, NP Lyda Jester, [place] [facility], [place] Forestine Na, NP Audry Riles, PharmD   Please be sure to bring in all your medications bottles to every appointment.

## 2022-04-02 NOTE — Progress Notes (Signed)
ADVANCED HF CLINIC NOTE  Primary Care: Cyndi Bender, PA-C Primary Cardiologist: Kate Sable, MD HF Cardiologist: Dr. Haroldine Laws  HPI: Leslie Esparza is a 51 y.o. with migraines, HTN, HL and systolic HF due to NICM.  Began to have palpitations in 6/21. She had echocardiogram 8/21 with moderately reduced LVEF 35-40%. Subsequent LHC 02/11/2020 with no evidence of CAD.  She underwent cardiac MRI 03/2020 with LVEF 57% and no evidence for scar or infiltrative disease.    Admitted 11/21 for TIA/CVA workup after presenting with word finding difficulty and sluggishness.. CTH and CTA head and neck without acute finding but nonenlarged and heterogenous thyroid gland with multiple nodules, reportedly unchanged from 2019 and before.  Initial MRI brain without contrast concerning for possible small cortical infarct in the high posterior left frontal cortex.  Neurology consulted.  Repeat MRI with no abnormality.  Echo on day of discharge with LVEF 51-76%, grade 1 diastolic dysfunction. Dr. Haroldine Laws reviewed the images, and EF felt to be 40-45% with septal dyssynergy and inferior wall HK  Zio patch 7/21: SR. Occasional SVT. Rare PVCs.   Zio patch 12/21: SR. One 14 beat run SVT. Rare PACs/PVCs  She was first seen 2/22 by Dr. Haroldine Laws. NYHA II, volume stable. Continued on Entresto and Coreg. Felt CM 2/2 LBBB and referred to EP.  Echo 9/22 showed EF 45-50%, global LV HK, RV ok. Echo 9/23 showed EF 45-50%, global LV HK, grade I DD, RV ok  Follow up 9/23 with Dr. Garen Lah, patient worried about TTR amyloidosis with back pain, carpal tunnel and conduction disease. Referred back to AHF to discuss PYP.  Follow up 10/23 with EP, felt to have a slowing of left anterior fascicle due to right axis deviation and could contribute to progression of LV dysfunction.   Today she returns for HF follow up with her husband to discuss possibility of amyloid. Overall feeling fine. She has no SOB with activity.  She has rare palpitations and rare atypical chest pain. Occasional lightheadedness, but no passing out. Denies abnormal bleeding, edema, or PND/Orthopnea. Appetite ok. No fever or chills. Weight at home 143 pounds. Taking all medications. Struggled with carpal tunnel pain for years. Has chronic low back pain. Had L carpal tunnel surgery and has R scheduled soon. She is worried she has cardiac amyloidosis.   FHx: Paternal uncle had HF Paternal had CAD   Past Medical History:  Diagnosis Date   Allergic rhinitis    Bilateral carpal tunnel syndrome    CHF (congestive heart failure) (HCC)    ef 45 to 50 % per last echo 03-03-2021   Common migraine    Complication of anesthesia    itching after c section   History of COVID-19 11/04/2020   x 3 days all symptoms resolved   Hypertension    Multiple thyroid nodules    monitored by pcp nathan conroy pa [facility], thryoid cysts also   Nonischemic cardiomyopathy (HCC)    OCD (obsessive compulsive disorder)    Osteoarthritis of thumbs    Pelvic pain    Recurrent cold sores     Current Outpatient Medications  Medication Sig Dispense Refill   Cholecalciferol (VITAMIN D) 125 MCG (5000 UT) CAPS Take 5,000 Units by mouth daily.     estradiol (ESTRACE) 0.1 MG/GM vaginal cream as needed.     FLUoxetine (PROZAC) 40 MG capsule Take 80 mg by mouth daily.     ibuprofen (ADVIL) 800 MG tablet Take 1 tablet (800 mg total) by  mouth every 8 (eight) hours. 30 tablet 0   metoprolol succinate (TOPROL XL) 25 MG 24 hr tablet Take 1 tablet (25 mg total) by mouth daily. 90 tablet 1   sacubitril-valsartan (ENTRESTO) 49-51 MG Take 1 tablet by mouth 2 (two) times daily. 60 tablet 5   valACYclovir (VALTREX) 1000 MG tablet As needed for cold sores     No current facility-administered medications for this encounter.    Allergies  Allergen Reactions   Bactrim [Sulfamethoxazole-Trimethoprim] Rash    Diffuse rash in setting of bactrim use (5 days after  initiation)   Penicillins Rash    had reaction as a child      Social History   Socioeconomic History   Marital status: Married    Spouse name: Not on file   Number of children: 2   Years of education: Not on file   Highest education level: Not on file  Occupational History   Not on file  Tobacco Use   Smoking status: Never   Smokeless tobacco: Never  Vaping Use   Vaping Use: Never used  Substance and Sexual Activity   Alcohol use: No    Alcohol/week: 0.0 standard drinks of alcohol   Drug use: No   Sexual activity: Not on file  Other Topics Concern   Not on file  Social History Narrative   Lives at home with husband and children   Right handed   Caffeine: none currently   Social Determinants of Health   Financial Resource Strain: Not on file  Food Insecurity: Not on file  Transportation Needs: Not on file  Physical Activity: Not on file  Stress: Not on file  Social Connections: Not on file  Intimate Partner Violence: Not on file      Family History  Problem Relation Age of Onset   Heart disease Paternal Grandfather    Thyroid disease Father    Migraines Mother    Migraines Brother    Stroke Neg Hx    Transient ischemic attack Neg Hx    BP 112/76   Pulse 64   Wt 65.6 kg (144 lb 9.6 oz)   LMP 06/06/2021 (Approximate)   SpO2 98%   BMI 27.32 kg/m   Wt Readings from Last 3 Encounters:  04/02/22 65.6 kg (144 lb 9.6 oz)  03/25/22 65.7 kg (144 lb 12.8 oz)  03/12/22 65.6 kg (144 lb 9.6 oz)   PHYSICAL EXAM: General:  NAD. No resp difficulty HEENT: Normal Neck: Supple. No JVD. Carotids 2+ bilat; no bruits. No lymphadenopathy or thryomegaly appreciated. Cor: PMI nondisplaced. Regular rate & rhythm. No rubs, gallops or murmurs. Lungs: Clear Abdomen: Soft, nontender, nondistended. No hepatosplenomegaly. No bruits or masses. Good bowel sounds. Extremities: No cyanosis, clubbing, rash, edema Neuro: Alert & oriented x 3, cranial nerves grossly intact. Moves  all 4 extremities w/o difficulty. Affect pleasant.  ECG (reviewed from 03/25/22): NSR, QRS 114 msec  ASSESSMENT & PLAN: 1. Chronic systolic HF due to NICM - Cath (8/21) normal cors - Echo (8/21): EF 35-40%. - cardiac MRI (03/2020) with LVEF 57%, RVEF 64%, and no evidence for scar or infiltrative disease.  - Echo (11/21) in setting of TIA/atypical migraine EF 35-40%, grade 1DD.  Dr. Haroldine Laws reviewed personally and EF felt to be 40-45%, with septal dyssynergy and inferior wall HK - Doing well NYHA I-II. Volume status ok. - Hold off adding SGLT2i with recent dizziness. - Continue Entresto 49/51 mg bid. - Continue Toprol XL 25 mg daily. - Off spiro  due to volume depletion.   - Suspect that she likely has LBBB cardiomyopathy despite her QRS being only in the 147m range as she had marked dyssynchrony on echo (on Dr. BClayborne Danaread). Suspect cMRI may be underwhelming due to the fact that her ECGs are so dynamic.  - She is followed by EP. - Labs today. - Cardiac MRI 10/21 with no evidence for infiltrative disease or LV thickening, no indication for further testing for TTR amyloidosis currently. Will defer to Dr. AGaren Lahregarding repeat cMRI. Discussed with Dr. BHaroldine Laws  2. TIA/Atypical migraine - Brain MRI normal 11/21  3. HTN - Controlled. - No change in medications today.  Follow up in 6 months with Dr. BHaroldine Laws JRafael Bihari FNP  12:08 PM

## 2022-06-28 ENCOUNTER — Other Ambulatory Visit: Payer: Self-pay

## 2022-06-28 MED ORDER — METOPROLOL SUCCINATE ER 25 MG PO TB24: 25.0000 mg | ORAL_TABLET | Freq: Every day | ORAL | 0 refills | Status: DC

## 2022-07-12 IMAGING — MR MR CARD MORPHOLOGY WO/W CM
45 of 48 series · 45 of 48 positions shown · IV contrast (gadavist)
Comparison: none

CLINICAL DATA: Hx of NICM, moderately reduced EF from echo [DATE]
(2 months prior). Evalute ef and infiltrative disease

EXAM:
CARDIAC MRI
TECHNIQUE: The patient was scanned on a 1.5 Tesla GE magnet. A dedicated
cardiac coil was used. Functional imaging was done using Fiesta
sequences. [DATE], and 4 chamber views were done to assess for RWMA's.
Modified Marzia rule using a short axis stack was used to
calculate an ejection fraction on a dedicated work station using
Circle software. The patient received 7 cc of Gadavist. After 10
minutes inversion recovery sequences were used to assess for
infiltration and scar tissue.
CONTRAST:  7 cc  of Gadavist

[Series 4: [id number] · axial · 8.0mm · 1.41mm/px · 1 of 16 slices shown]
[im 1/16]
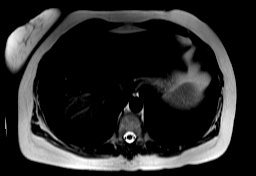

[Series 8: bSSFP · oblique · 8.0mm · 1.61mm/px · 1 of 25 slices shown (1 of 19)]
[im 1/25]
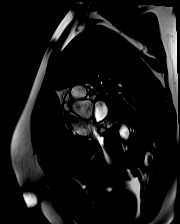

[Series 9: bSSFP · oblique · 8.0mm · 1.61mm/px · 1 of 25 slices shown (2 of 19)]
[im 1/25]
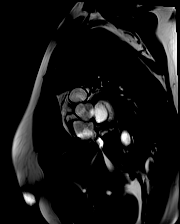

[Series 10: bSSFP · oblique · 8.0mm · 1.61mm/px · 1 of 25 slices shown (3 of 19)]
[im 1/25]
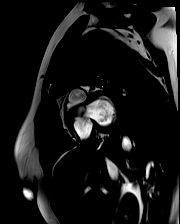

[Series 11: bSSFP · oblique · 8.0mm · 1.61mm/px · 1 of 25 slices shown (4 of 19)]
[im 1/25]
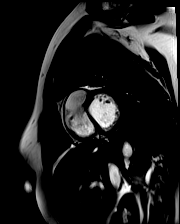

[Series 12: bSSFP · oblique · 8.0mm · 1.61mm/px · 1 of 25 slices shown (5 of 19)]
[im 1/25]
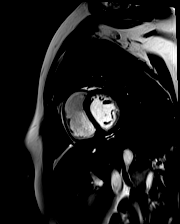

[Series 13: bSSFP · oblique · 8.0mm · 1.61mm/px · 1 of 25 slices shown (6 of 19)]
[im 1/25]
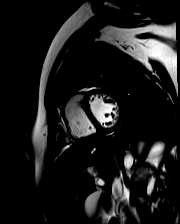

[Series 14: bSSFP · oblique · 8.0mm · 1.61mm/px · 1 of 25 slices shown (7 of 19)]
[im 1/25]
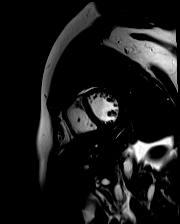

[Series 15: bSSFP · oblique · 8.0mm · 1.61mm/px · 1 of 25 slices shown (8 of 19)]
[im 1/25]
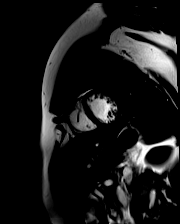

[Series 16: bSSFP · oblique · 8.0mm · 1.61mm/px · 1 of 25 slices shown (9 of 19)]
[im 1/25]
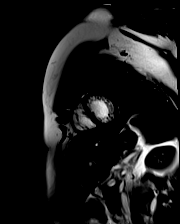

[Series 17: bSSFP · oblique · 8.0mm · 1.61mm/px · 1 of 25 slices shown (10 of 19)]
[im 1/25]
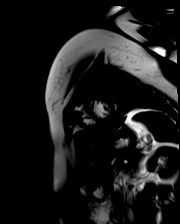

[Series 18: bSSFP · oblique · 8.0mm · 1.61mm/px · 1 of 25 slices shown (11 of 19)]
[im 1/25]
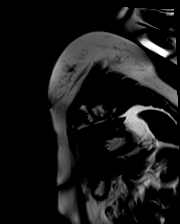

[Series 19: bSSFP · oblique · 8.0mm · 1.61mm/px · 1 of 25 slices shown (12 of 19)]
[im 1/25]
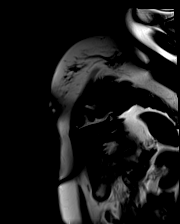

[Series 20: bSSFP · oblique · 8.0mm · 1.61mm/px · 1 of 25 slices shown (13 of 19)]
[im 1/25]
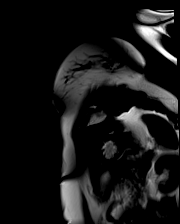

[Series 21: bSSFP · oblique · 8.0mm · 1.61mm/px · 1 of 25 slices shown (14 of 19)]
[im 1/25]
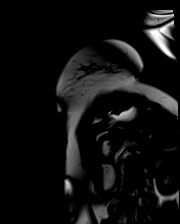

[Series 22: bSSFP · oblique · 8.0mm · 1.61mm/px · 1 of 25 slices shown (15 of 19)]
[im 1/25]
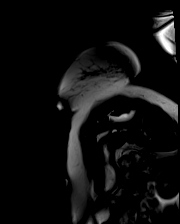

[Series 23: bSSFP · oblique · 6.0mm · 1.41mm/px · 1 of 25 slices shown (16 of 19)]
[im 1/25]
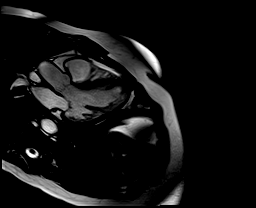

[Series 24: bSSFP · oblique · 6.0mm · 1.41mm/px · 1 of 25 slices shown (17 of 19)]
[im 1/25]
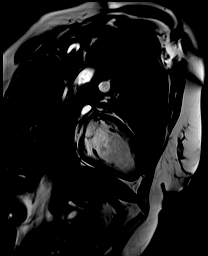

[Series 25: bSSFP · axial · 6.0mm · 1.41mm/px · 1 of 25 slices shown (18 of 19)]
[im 1/25]
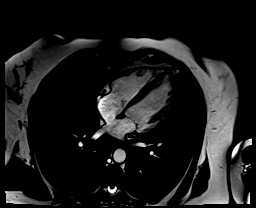

[Series 26: (id)_long_t1 · oblique · 8.0mm · 1.56mm/px · 1 of 24 slices shown]
[im 1/24]
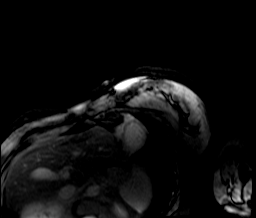

[Series 27: (id)_long_t1_moco · oblique · 8.0mm · 1.56mm/px · 1 of 24 slices shown]
[im 1/24]
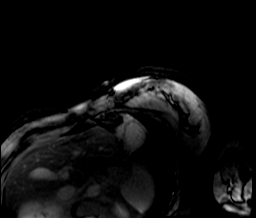

[Series 28: (id)_long_t1_moco_t1 · oblique · 8.0mm · 1.56mm/px · 1 of 6 slices shown]
[im 1/6]
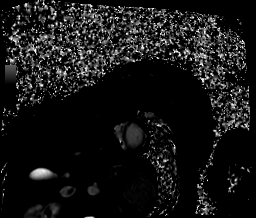

[Series 30: (id)_trufi · oblique · 8.0mm · 2.08mm/px · 1 of 9 slices shown]
[im 1/9]
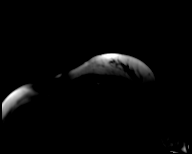

[Series 31: (id)_trufi_moco · oblique · 8.0mm · 2.08mm/px · 1 of 9 slices shown]
[im 1/9]
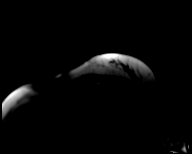

[Series 32: (id)_trufi_moco_t2 · oblique · 8.0mm · 2.08mm/px · 1 of 3 slices shown]
[im 1/3]
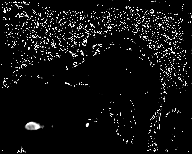

[Series 40: pre short axis · oblique · non-contrast · 8.0mm · 2.25mm/px · 1 of 10 slices shown (1 of 6)]
[im 1/10]
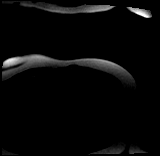

[Series 41: pre short axis · oblique · non-contrast · 8.0mm · 2.25mm/px · 1 of 10 slices shown (2 of 6)]
[im 1/10]
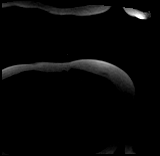

[Series 42: pre short axis · oblique · non-contrast · 8.0mm · 2.25mm/px · 1 of 10 slices shown (3 of 6)]
[im 1/10]
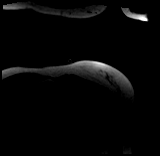

[Series 43: pre short axis · oblique · non-contrast · 8.0mm · 2.25mm/px · 1 of 10 slices shown (4 of 6)]
[im 1/10]
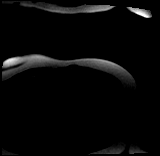

[Series 44: pre short axis · oblique · non-contrast · 8.0mm · 2.25mm/px · 1 of 10 slices shown (5 of 6)]
[im 1/10]
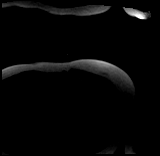

[Series 45: pre short axis · oblique · non-contrast · 8.0mm · 2.25mm/px · 1 of 10 slices shown (6 of 6)]
[im 1/10]
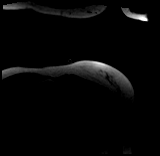

[Series 49: rest short axis · oblique · 8.0mm · 2.25mm/px · 1 of 80 slices shown (1 of 6)]
[im 1/80]
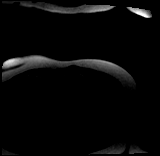

[Series 50: rest short axis · oblique · 8.0mm · 2.25mm/px · 1 of 80 slices shown (2 of 6)]
[im 1/80]
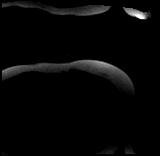

[Series 51: rest short axis · oblique · 8.0mm · 2.25mm/px · 1 of 80 slices shown (3 of 6)]
[im 1/80]
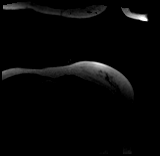

[Series 52: rest short axis · oblique · 8.0mm · 2.25mm/px · 1 of 80 slices shown (4 of 6)]
[im 1/80]
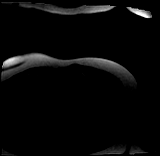

[Series 53: rest short axis · oblique · 8.0mm · 2.25mm/px · 1 of 80 slices shown (5 of 6)]
[im 1/80]
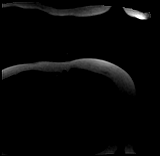

[Series 54: rest short axis · oblique · 8.0mm · 2.25mm/px · 1 of 80 slices shown (6 of 6)]
[im 1/80]
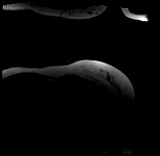

[Series 55: bSSFP · coronal · 6.0mm · 1.41mm/px · 1 of 25 slices shown (19 of 19)]
[im 1/25]
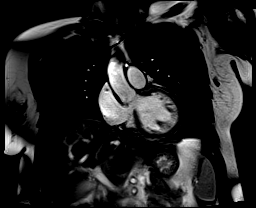

[Series 56: cine rvit · oblique · 6.0mm · 1.41mm/px · 1 of 25 slices shown]
[im 1/25]
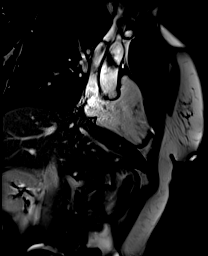

[Series 57: aortic valve cine · oblique · 6.0mm · 1.41mm/px · 1 of 25 slices shown]
[im 1/25]
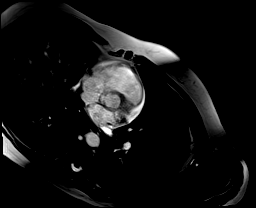

[Series 58: cine rvot · sagittal · 6.0mm · 1.41mm/px · 1 of 25 slices shown]
[im 1/25]
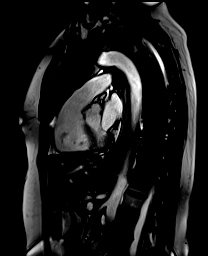

[Series 61: lge_single shot sa · oblique · 8.0mm · 2.08mm/px · 1 of 14 slices shown (1 of 2)]
[im 1/14]
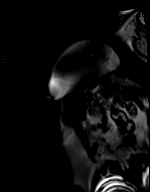

[Series 62: lge_single shot sa · oblique · 8.0mm · 2.08mm/px · 1 of 14 slices shown (2 of 2)]
[im 1/14]
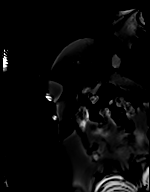

[Series 67: lge_single shot 4 · axial · 6.0mm · 1.98mm/px · 1 of 1 slices shown (1 of 2)]
[im 1/1]
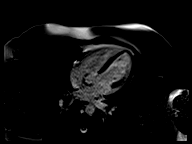

[Series 68: lge_single shot 4 · axial · 6.0mm · 1.98mm/px · 1 of 1 slices shown (2 of 2)]
[im 1/1]
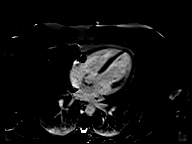

[45 of 48 positions shown; findings below may reference images not displayed]

FINDINGS: 1. Normal left ventricular size, thickness and systolic function
(LVEF = 57%). There are no regional wall motion abnormalities.

There is no late gadolinium enhancement in the left ventricular
myocardium.

LVEDV: 139 ml

LVESV: 59 ml

SV: 80 ml

CO: 4.7 L/min

Myocardial mass:108g

2. Normal right ventricular size, thickness and systolic function
(RVEF = 64%). There are no regional wall motion abnormalities.

3.  Normal left and right atrial size.

4. Normal size of the aortic root, ascending aorta and pulmonary
artery.

5.  No significant valvular abnormalities.

6.  Normal pericardium.  No pericardial effusion.
IMPRESSION: 1.  Normal Left ventricular and right ventricular function, LVEF 57%

2.  There is no evidence of late gadolinium enhancement or scar

3.  No significant valvular abnormalities

4.  No evidence for infiltrative disease

5.  LVEF is now normalized compared to previous echocardiogram

Ivis Pa [place]

## 2022-09-10 ENCOUNTER — Encounter: Payer: Self-pay | Admitting: Cardiology

## 2022-09-10 ENCOUNTER — Ambulatory Visit: Payer: 59 | Attending: Cardiology | Admitting: Cardiology

## 2022-09-10 VITALS — BP 106/74 | HR 64 | Ht 61.0 in | Wt 148.2 lb

## 2022-09-10 DIAGNOSIS — Z01810 Encounter for preprocedural cardiovascular examination: Secondary | ICD-10-CM | POA: Diagnosis not present

## 2022-09-10 DIAGNOSIS — I428 Other cardiomyopathies: Secondary | ICD-10-CM

## 2022-09-10 DIAGNOSIS — I10 Essential (primary) hypertension: Secondary | ICD-10-CM | POA: Diagnosis not present

## 2022-09-10 NOTE — Patient Instructions (Signed)
Medication Instructions:   Your physician recommends that you continue on your current medications as directed. Please refer to the Current Medication list given to you today.  *If you need a refill on your cardiac medications before your next appointment, please call your pharmacy*   Lab Work:  None ordered  If you have labs (blood work) drawn today and your tests are completely normal, you will receive your results only by: [place] (if you have MyChart) OR A paper copy in the mail If you have any lab test that is abnormal or we need to change your treatment, we will call you to review the results.   Testing/Procedures:  Your physician has requested that you have an echocardiogram - in one year. Echocardiography is a painless test that uses sound waves to create images of your heart. It provides your doctor with information about the size and shape of your heart and how well your heart's chambers and valves are working. This procedure takes approximately one hour. There are no restrictions for this procedure. Please do NOT wear cologne, perfume, aftershave, or lotions (deodorant is allowed). Please arrive 15 minutes prior to your appointment time.    Follow-Up: At [facility], you and your health needs are our priority.  As part of our continuing mission to provide you with exceptional heart care, we have created designated Provider Care Teams.  These Care Teams include your primary Cardiologist (physician) and Advanced Practice Providers (APPs -  Physician Assistants and Nurse Practitioners) who all work together to provide you with the care you need, when you need it.  We recommend signing up for the patient portal called "MyChart".  Sign up information is provided on this After Visit Summary.  MyChart is used to connect with patients for Virtual Visits (Telemedicine).  Patients are able to view lab/test results, encounter notes, upcoming appointments, etc.   Non-urgent messages can be sent to your provider as well.   To learn more about what you can do with MyChart, go to [email or web address].    Your next appointment:   After Echocardiogram in 12 month(s) -   Provider:   You may see Kate Sable, MD or one of the following Advanced Practice Providers on your designated Care Team:   Murray Hodgkins, NP Christell Faith, PA-C Cadence Kathlen Mody, PA-C Gerrie Nordmann, NP

## 2022-09-10 NOTE — Progress Notes (Signed)
Cardiology Office Note:    Date:  09/10/2022   ID:  Leslie Esparza, DOB 08/03/1970, MRN [id number]  PCP:  Cyndi Bender, PA-C  [facility] Cardiologist:  Kate Sable, MD  [place] Electrophysiologist:  None   Referring MD: Cyndi Bender, PA-C   Chief Complaint  Patient presents with   Follow-up    6 month f/u, no new cardiac concerns     History of Present Illness:    Leslie Esparza is a 52 y.o. female with a hx of NICM (initial EF 35%, last echo EF 45 to 50%), hypertension, hyperlipidemia, who presents for follow-up.    Feels well, denies chest pain or shortness of breath.  BP adequately controlled, denies dizziness, edema, syncope.  Aldactone previously stopped due to hypovolemia.  Currently on Toprol-XL and Entresto which she is tolerating.  Last echo showed EF 45 to 50%.  She has carpal tunnel, planning on surgical intervention in the near future.  Previously had surgical intervention to the left hand/wrist successfully.  Prior notes Echo 02/2021 EF 45 to 50%  She underwent left heart cath on 02/11/2020 with no evidence of CAD.  Echo on 02/01/2020 showed moderately reduced EF, 35 to 40%.  CMR 03/2020 EF 57%, no evidence of scar Echo 10/02/2020 EF 30 to 35%    Past Medical History:  Diagnosis Date   Allergic rhinitis    Bilateral carpal tunnel syndrome    CHF (congestive heart failure) ([place])    ef 45 to 50 % per last echo 03-03-2021   Common migraine    Complication of anesthesia    itching after c section   History of COVID-19 11/04/2020   x 3 days all symptoms resolved   Hypertension    Multiple thyroid nodules    monitored by pcp nathan conroy pa [facility], thryoid cysts also   Nonischemic cardiomyopathy ([place])    OCD (obsessive compulsive disorder)    Osteoarthritis of thumbs    Pelvic pain    Recurrent cold sores     Past Surgical History:  Procedure Laterality Date   ABDOMINAL HYSTERECTOMY     CESAREAN SECTION     x2 2000 and  2004   CYSTOSCOPY N/A 06/12/2021   Procedure: CYSTOSCOPY;  Surgeon: Waymon Amato, MD;  Location: [place];  Service: Gynecology;  Laterality: N/A;   LAPAROSCOPIC HYSTERECTOMY N/A 06/12/2021   Procedure: HYSTERECTOMY TOTAL LAPAROSCOPIC;  Surgeon: Waymon Amato, MD;  Location: [place];  Service: Gynecology;  Laterality: N/A;   LEFT HEART CATH AND CORONARY ANGIOGRAPHY Left 02/11/2020   Procedure: LEFT HEART CATH AND CORONARY ANGIOGRAPHY;  Surgeon: Wellington Hampshire, MD;  Location: [place] [facility];  Service: Cardiovascular;  Laterality: Left;   WISDOM TOOTH EXTRACTION     yrs ago per pt on 06-03-2021    Current Medications: Current Meds  Medication Sig   Cholecalciferol (VITAMIN D) 125 MCG (5000 UT) CAPS Take 5,000 Units by mouth daily.   estradiol (ESTRACE) 0.1 MG/GM vaginal cream as needed.   FLUoxetine (PROZAC) 40 MG capsule Take 80 mg by mouth daily.   ibuprofen (ADVIL) 800 MG tablet Take 1 tablet (800 mg total) by mouth every 8 (eight) hours. (Patient taking differently: Take 800 mg by mouth every 6 (six) hours as needed for mild pain.)   Magnesium 400 MG CAPS Take 400 mg by mouth daily.   metoprolol succinate (TOPROL XL) 25 MG 24 hr tablet Take 1 tablet (25 mg total) by mouth daily.  Omega-3 Fatty Acids (FISH OIL) 1000 MG CAPS Take 1,200 mg by mouth daily.   sacubitril-valsartan (ENTRESTO) 49-51 MG Take 1 tablet by mouth 2 (two) times daily.   valACYclovir (VALTREX) 1000 MG tablet As needed for cold sores   zinc gluconate 50 MG tablet Take 15 mg by mouth daily.     Allergies:   Bactrim [sulfamethoxazole-trimethoprim] and Penicillins   Social History   Socioeconomic History   Marital status: Married    Spouse name: Not on file   Number of children: 2   Years of education: Not on file   Highest education level: Not on file  Occupational History   Not on file  Tobacco Use   Smoking status: Never   Smokeless tobacco: Never  Vaping Use    Vaping Use: Never used  Substance and Sexual Activity   Alcohol use: No    Alcohol/week: 0.0 standard drinks of alcohol   Drug use: No   Sexual activity: Not on file  Other Topics Concern   Not on file  Social History Narrative   Lives at home with husband and children   Right handed   Caffeine: none currently   Social Determinants of Health   Financial Resource Strain: Not on file  Food Insecurity: Not on file  Transportation Needs: Not on file  Physical Activity: Not on file  Stress: Not on file  Social Connections: Not on file     Family History: The patient's family history includes Heart disease in her paternal grandfather; Migraines in her brother and mother; Thyroid disease in her father. There is no history of Stroke or Transient ischemic attack.  ROS:   Please see the history of present illness.     All other systems reviewed and are negative.  EKGs/Labs/Other Studies Reviewed:    The following studies were reviewed today:   EKG:  EKG is ordered today.  EKG shows normal sinus rhythm, heart rate 64  Recent Labs: 04/02/2022: BUN 10; Creatinine, Ser 0.58; Potassium 4.0; Sodium 139  Recent Lipid Panel    Component Value Date/Time   CHOL 155 04/24/2020 0431   TRIG 78 04/24/2020 0431   HDL 43 04/24/2020 0431   CHOLHDL 3.6 04/24/2020 0431   VLDL 16 04/24/2020 0431   LDLCALC 96 04/24/2020 0431    Physical Exam:    VS:  BP 106/74 (BP Location: Left Arm, Patient Position: Sitting, Cuff Size: Normal)   Pulse 64   Ht 5\' 1"  (1.549 m)   Wt 148 lb 3.2 oz (67.2 kg)   LMP 06/06/2021 (Approximate)   SpO2 99%   BMI 28.00 kg/m     Wt Readings from Last 3 Encounters:  09/10/22 148 lb 3.2 oz (67.2 kg)  04/02/22 144 lb 9.6 oz (65.6 kg)  03/25/22 144 lb 12.8 oz (65.7 kg)     GEN:  Well nourished, well developed in no acute distress HEENT: Normal NECK: No JVD; No carotid bruits CARDIAC: RRR, no murmurs, rubs, gallops RESPIRATORY:  Clear to auscultation without  rales, wheezing or rhonchi  ABDOMEN: Soft, non-tender, non-distended MUSCULOSKELETAL:  No edema; No deformity  SKIN: Warm and dry NEUROLOGIC:  Alert and oriented x 3 PSYCHIATRIC:  Normal affect   ASSESSMENT:    1. NICM (nonischemic cardiomyopathy) ([place])   2. Primary hypertension   3. Pre-operative cardiovascular examination    PLAN:    In order of problems listed above:    Nonischemic cardiomyopathy, initial EF 35%.  Last echo 02/2021 EF 45 to 50%.  Cont Toprol-XL 25 mg daily, Entresto 49-51 mg twice daily.  Low normal BP preventing up titration of GDMT.  Repeat echo in 1 year. Hx of hypertension, BP controlled.  Continue Toprol-XL and Entresto. Preop evaluation, carpal tunnel release being planned on the right wrist.  Okay for procedure from a cardiac perspective.  Patient is asymptomatic, no additional cardiac testing indicated.  Follow-up in 12 months   This note was generated in part or whole with voice recognition software. Voice recognition is usually quite accurate but there are transcription errors that can and very often do occur. I apologize for any typographical errors that were not detected and corrected.  Medication Adjustments/Labs and Tests Ordered: Current medicines are reviewed at length with the patient today.  Concerns regarding medicines are outlined above.  Orders Placed This Encounter  Procedures   EKG 12-Lead   ECHOCARDIOGRAM COMPLETE     No orders of the defined types were placed in this encounter.    Patient Instructions  Medication Instructions:   Your physician recommends that you continue on your current medications as directed. Please refer to the Current Medication list given to you today.  *If you need a refill on your cardiac medications before your next appointment, please call your pharmacy*   Lab Work:  None ordered  If you have labs (blood work) drawn today and your tests are completely normal, you will receive your results only  by: [place] (if you have MyChart) OR A paper copy in the mail If you have any lab test that is abnormal or we need to change your treatment, we will call you to review the results.   Testing/Procedures:  Your physician has requested that you have an echocardiogram - in one year. Echocardiography is a painless test that uses sound waves to create images of your heart. It provides your doctor with information about the size and shape of your heart and how well your heart's chambers and valves are working. This procedure takes approximately one hour. There are no restrictions for this procedure. Please do NOT wear cologne, perfume, aftershave, or lotions (deodorant is allowed). Please arrive 15 minutes prior to your appointment time.    Follow-Up: At [facility], you and your health needs are our priority.  As part of our continuing mission to provide you with exceptional heart care, we have created designated Provider Care Teams.  These Care Teams include your primary Cardiologist (physician) and Advanced Practice Providers (APPs -  Physician Assistants and Nurse Practitioners) who all work together to provide you with the care you need, when you need it.  We recommend signing up for the patient portal called "MyChart".  Sign up information is provided on this After Visit Summary.  MyChart is used to connect with patients for Virtual Visits (Telemedicine).  Patients are able to view lab/test results, encounter notes, upcoming appointments, etc.  Non-urgent messages can be sent to your provider as well.   To learn more about what you can do with MyChart, go to [email or web address].    Your next appointment:   After Echocardiogram in 12 month(s) -   Provider:   You may see Kate Sable, MD or one of the following Advanced Practice Providers on your designated Care Team:   Murray Hodgkins, NP Christell Faith, PA-C Cadence Kathlen Mody, PA-C Gerrie Nordmann, NP    Signed, Kate Sable, MD  09/10/2022 12:15 PM    [place]

## 2022-10-11 ENCOUNTER — Other Ambulatory Visit: Payer: Self-pay

## 2022-10-11 MED ORDER — METOPROLOL SUCCINATE ER 25 MG PO TB24: 25.0000 mg | ORAL_TABLET | Freq: Every day | ORAL | 2 refills | Status: DC

## 2022-10-11 MED ORDER — ENTRESTO 49-51 MG PO TABS: 1.0000 | ORAL_TABLET | Freq: Two times a day (BID) | ORAL | 8 refills | Status: DC

## 2022-10-11 NOTE — Addendum Note (Signed)
Addended by: Auburn Bilberry D on: 10/11/2022 03:57 PM   Modules accepted: Orders

## 2022-11-30 ENCOUNTER — Encounter: Payer: Self-pay | Admitting: Cardiology

## 2023-04-08 NOTE — Progress Notes (Signed)
Office Visit Note  Patient: Leslie Esparza             Date of Birth: 1971-04-29           MRN: [id number]             PCP: Lonie Peak, PA-C Referring: Lonie Peak, PA-C Visit Date: 04/22/2023 Occupation: @GUAROCC @  Subjective:  Pain in multiple joints  History of Present Illness: Leslie Esparza is a 52 y.o. female seen in consultation per request of her PCP.  According the patient her symptoms started in 2000.  She states she was postpartum when she started experiencing pain in her hips and her bilateral thumbs.  She was seen by an orthopedic surgeon and was told that she had osteoarthritis in her hands.  She states her symptoms persist.  She took some over-the-counter NSAIDs.  She states she lived with discomfort for many years.  About 5 years ago she started having lower back pain and was seen by her GYN.  She underwent hysterectomy for lower back pain without much relief.  She was seen by an orthopedic surgeon who did x-rays and told her that she had degenerative disc disease.  She does not recall having physical therapy or any treatment.  She states she took pain medications for short time and then stopped them.  In 2023 she had worsening of the pain in her hands and was evaluated by orthopedic surgeon again.  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  She had left [facility] surgery and left carpal tunnel release in July 2023 by Dr. Yehuda Budd and right [facility] surgery and right carpal tunnel release in August 2024 by Dr. Yehuda Budd.  She noted improvement in her thumbs.  She continues to have pain and stiffness in her bilateral hands.  She also continues to have discomfort in her lower back, her elbows, knees, ankles and her feet.  She has noticed intermittent swelling in her hands and her ankles.  She denies any history of oral ulcers, nasal ulcers, Raynaud's phenomenon, photosensitivity or lymphadenopathy.  There is no family history of autoimmune disease.  Her father has fibromyalgia.   She is gravida 3, para 2, miscarriage 1.  There is no history of preeclampsia or DVTs.  She works Careers adviser jobs.  She enjoys walking for exercise.    Activities of Daily Living:  Patient reports morning stiffness for all day. Patient Reports nocturnal pain.  Difficulty dressing/grooming: Denies Difficulty climbing stairs: Reports Difficulty getting out of chair: Reports Difficulty using hands for taps, buttons, cutlery, and/or writing: Denies  Review of Systems  Constitutional:  Positive for fatigue.  HENT:  Positive for mouth dryness. Negative for mouth sores.   Eyes:  Negative for dryness.  Respiratory:  Negative for cough, shortness of breath and wheezing.   Cardiovascular:  Positive for palpitations.  Gastrointestinal:  Negative for blood in stool, constipation and diarrhea.  Endocrine: Negative for increased urination.  Genitourinary:  Negative for involuntary urination.  Musculoskeletal:  Positive for joint pain, gait problem, joint pain, joint swelling, myalgias, muscle weakness, morning stiffness, muscle tenderness and myalgias.  Skin:  Positive for hair loss. Negative for color change, rash and sensitivity to sunlight.  Allergic/Immunologic: Negative for susceptible to infections.  Neurological:  Positive for dizziness. Negative for headaches.  Hematological:  Negative for swollen glands.  Psychiatric/Behavioral:  Positive for sleep disturbance. Negative for depressed mood. The patient is not nervous/anxious.     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/2021   Chronic pain syndrome 08/11/2020   Lumbar radiculopathy 08/11/2020   Essential hypertension 04/23/2020   Depression 04/23/2020   Hx of migraine headaches 04/23/2020   Congestive heart failure (HCC) 03/22/2020   HFrEF (heart failure with reduced ejection fraction) (HCC)    Sacroiliac joint pain 03/28/2019   Pain in pelvis 01/11/2019   CAP (community acquired pneumonia) 04/25/2014   Cough 04/24/2014    Past Medical History:  Diagnosis Date   Allergic rhinitis    Bilateral carpal tunnel syndrome    CHF (congestive heart failure) (HCC)    ef 45 to 50 % per last echo 03-03-2021   Common migraine    Complication of anesthesia    itching after c section   History of COVID-19 11/04/2020   x 3 days all symptoms resolved   Hypertension    Multiple thyroid nodules    monitored by pcp nathan conroy pa [facility], thryoid cysts also   Nonischemic cardiomyopathy (HCC)    OCD (obsessive compulsive disorder)    Osteoarthritis of thumbs    Pelvic pain    Recurrent cold sores     Family History  Problem Relation Age of Onset   Migraines Mother    Thyroid disease Father    Heart disease Father    Fibromyalgia Father    Migraines Brother    Heart disease Paternal Grandfather    Healthy Son    Healthy Daughter    Stroke Neg Hx    Transient ischemic attack Neg Hx    Past Surgical History:  Procedure Laterality Date   CESAREAN SECTION     x2 2000 and 2004   CYSTOSCOPY N/A 06/12/2021   Procedure: CYSTOSCOPY;  Surgeon: Hoover Browns, MD;  Location: [facility];  Service: Gynecology;  Laterality: N/A;   LAPAROSCOPIC HYSTERECTOMY N/A 06/12/2021   Procedure: HYSTERECTOMY TOTAL LAPAROSCOPIC;  Surgeon: Hoover Browns, MD;  Location: [facility];  Service: Gynecology;  Laterality: N/A;   LEFT HEART CATH AND CORONARY ANGIOGRAPHY Left  02/11/2020   Procedure: LEFT HEART CATH AND CORONARY ANGIOGRAPHY;  Surgeon: Iran Ouch, MD;  Location: [facility];  Service: Cardiovascular;  Laterality: Left;   WISDOM TOOTH EXTRACTION     yrs ago per pt on 06-03-2021   WRIST ARTHROSCOPY WITH CARPOMETACARPEL [facility]) ARTHROPLASTY Bilateral    2024, 2023   Social History   Social History Narrative   Lives at home with husband and children   Right handed   Caffeine: none currently   Immunization History  Administered Date(s) Administered   Influenza Split 03/14/2014     Objective: Vital Signs: BP 124/84 (BP Location: Right Arm, Patient Position: Sitting, Cuff Size: Normal)   Pulse 74   Ht 5' 1.5" (1.562 m)   Wt 151 lb 9.6 oz (68.8 kg)   LMP 06/06/2021 (Approximate)   BMI 28.18 kg/m    Physical Exam Vitals and nursing note reviewed.  Constitutional:      Appearance: She is  well-developed.  HENT:     Head: Normocephalic and atraumatic.  Eyes:     Conjunctiva/sclera: Conjunctivae normal.  Cardiovascular:     Rate and Rhythm: Normal rate and regular rhythm.     Heart sounds: Normal heart sounds.  Pulmonary:     Effort: Pulmonary effort is normal.     Breath sounds: Normal breath sounds.  Abdominal:     General: Bowel sounds are normal.     Palpations: Abdomen is soft.  Musculoskeletal:     Cervical back: Normal range of motion.  Lymphadenopathy:     Cervical: No cervical adenopathy.  Skin:    General: Skin is warm and dry.     Capillary Refill: Capillary refill takes less than 2 seconds.  Neurological:     Mental Status: She is alert and oriented to person, place, and time.  Psychiatric:        Behavior: Behavior normal.      Musculoskeletal Exam: Cervical spine was in good range of motion.  Patient had painful range of motion of her lumbar spine without any point tenderness.  She had no tenderness over SI joints.  Shoulder joints, elbow joints, wrist joints were in good range of motion.  She had  postsurgical scar over bilateral CMC and wrist joints from recent surgery.  Bilateral PIP and DIP thickening was noted.  No synovitis was noted.  She had painful range of motion of bilateral hip joints.  Hip joints were in full range of motion.  Knee joints were in full range of motion without any warmth swelling or effusion.  There was no synovitis or tenderness over ankles.  She had no synovitis over MTP joints.  Patient had difficulty walking due to discomfort in the MTP joints.  There was no plantar fasciitis or Achilles tendinitis.  CDAI Exam: CDAI Score: -- Patient Global: --; Provider Global: -- Swollen: --; Tender: -- Joint Exam 04/22/2023   No joint exam has been documented for this visit   There is currently no information documented on the homunculus. Go to the Rheumatology activity and complete the homunculus joint exam.  Investigation: No additional findings.  Imaging: No results found.  Recent Labs: Lab Results  Component Value Date   WBC 7.8 07/10/2021   HGB 12.6 07/10/2021   PLT 225 07/10/2021   NA 139 04/02/2022   K 4.0 04/02/2022   CL 98 04/02/2022   CO2 27 04/02/2022   GLUCOSE 90 04/02/2022   BUN 10 04/02/2022   CREATININE 0.58 04/02/2022   BILITOT 0.4 07/10/2021   ALKPHOS 60 07/10/2021   AST 26 07/10/2021   ALT 30 07/10/2021   PROT 6.8 07/10/2021   ALBUMIN 3.5 07/10/2021   CALCIUM 10.1 04/02/2022   GFRAA 117 03/06/2020    Speciality Comments: No specialty comments available.  Procedures:  No procedures performed Allergies: Bactrim [sulfamethoxazole-trimethoprim] and Penicillins   Assessment / Plan:     Visit Diagnoses: Polyarthralgia -patient complains of pain and discomfort in multiple joints for the last several years.  The pain started in 2000 after childbirth.  The pain was initially in her hips and her hands.  Gradually the pain moved to the other joints.  01/16/21: dsDNA-, Ro-, La-, Sm-, anti-CCP 7. 10/29/22: RF 10.8, ESR 13, uric acid  4.7  Pain in both hands -patient complains of pain and discomfort in the bilateral hands with intermittent swelling.  No synovitis was noted.  She had bilateral CMC surgery and also bilateral carpal tunnel release.  Bilateral PIP  and DIP thickening was noted.  I will obtain x-rays and labs today.  Joint protection muscle strengthening was discussed.  Plan: XR Hand 2 View Right, XR Hand 2 View Left, osteoarthritic changes were noted on the x-rays.  Sedimentation rate, Rheumatoid factor, Cyclic citrul peptide antibody, IgG, ANA  Ulnar nerve entrapment at elbow, bilateral-patient states she has bilateral ulnar nerve entrapment which causes elbow joint discomfort.  Bilateral carpal tunnel syndrome-she had left carpal tunnel release with left [facility] surgery in July 2023 and right carpal tunnel release with right [facility] surgery in August 2024 by Dr. Yehuda Budd at [facility].  Her symptoms improved after the surgery.  Chronic pain of both hips -she had painful range of motion of bilateral hips.  Her hip joints were in full range of motion.  Plan: XR HIPS BILAT W OR W/O PELVIS 3-4 VIEWS.  X-rays of the bilateral hips were unremarkable.  Pain in both feet -she complains of pain and discomfort in her bilateral ankles and feet.  No synovitis was noted.  Plan: XR Foot 2 Views Right, XR Foot 2 Views Left.  Early osteoarthritic changes were noted.  Lumbar spondylosis -she has lower back pain for many years.  Patient states she was diagnosed with disc disease in the past.  She denies any radiculopathy.  Plan: XR Lumbar Spine 2-3 Views.  Mild spondylosis was noted.  Facet joint arthropathy was noted.  Myalgia -she complains of generalized muscle achiness.  No muscular weakness or tenderness was noted on the examination today.  Plan: CK  Other fatigue -she gives history of chronic fatigue.  Plan: CBC with Differential/Platelet, COMPLETE METABOLIC PANEL WITH GFR, TSH, Serum protein electrophoresis with reflex  Chronic pain  syndrome-patient was diagnosed with chronic pain syndrome in the past and was treated with naltrexone by the orthopedic surgeon.  She is currently not taking any narcotics.  Other medical problems are listed as follows:  HFrEF (heart failure with reduced ejection fraction) (HCC)  NICM (nonischemic cardiomyopathy) (HCC)  Essential hypertension-blood pressure was normal at 124/84.  Mixed hyperlipidemia  Thyroid nodule  Panic disorder  History of OCD (obsessive compulsive disorder)  S/P total hysterectomy  Orders: Orders Placed This Encounter  Procedures   XR Hand 2 View Right   XR Hand 2 View Left   XR HIPS BILAT W OR W/O PELVIS 3-4 VIEWS   XR Foot 2 Views Right   XR Foot 2 Views Left   XR Lumbar Spine 2-3 Views   CBC with Differential/Platelet   COMPLETE METABOLIC PANEL WITH GFR   Sedimentation rate   CK   TSH   Rheumatoid factor   Cyclic citrul peptide antibody, IgG   ANA   Serum protein electrophoresis with reflex   No orders of the defined types were placed in this encounter.    Follow-Up Instructions: Return for Polyarthralgia.   Pollyann Savoy, MD  Note - This record has been created using Animal nutritionist.  Chart creation errors have been sought, but may not always  have been located. Such creation errors do not reflect on  the standard of medical care.

## 2023-04-22 ENCOUNTER — Ambulatory Visit: Payer: 59

## 2023-04-22 ENCOUNTER — Ambulatory Visit (INDEPENDENT_AMBULATORY_CARE_PROVIDER_SITE_OTHER): Payer: 59

## 2023-04-22 ENCOUNTER — Encounter: Payer: Self-pay | Admitting: Rheumatology

## 2023-04-22 ENCOUNTER — Ambulatory Visit: Payer: 59 | Attending: Rheumatology | Admitting: Rheumatology

## 2023-04-22 VITALS — BP 124/84 | HR 74 | Ht 61.5 in | Wt 151.6 lb

## 2023-04-22 DIAGNOSIS — M79672 Pain in left foot: Secondary | ICD-10-CM | POA: Diagnosis not present

## 2023-04-22 DIAGNOSIS — M79671 Pain in right foot: Secondary | ICD-10-CM

## 2023-04-22 DIAGNOSIS — G894 Chronic pain syndrome: Secondary | ICD-10-CM

## 2023-04-22 DIAGNOSIS — Z8669 Personal history of other diseases of the nervous system and sense organs: Secondary | ICD-10-CM

## 2023-04-22 DIAGNOSIS — G5603 Carpal tunnel syndrome, bilateral upper limbs: Secondary | ICD-10-CM | POA: Diagnosis not present

## 2023-04-22 DIAGNOSIS — I5020 Unspecified systolic (congestive) heart failure: Secondary | ICD-10-CM

## 2023-04-22 DIAGNOSIS — M79642 Pain in left hand: Secondary | ICD-10-CM | POA: Diagnosis not present

## 2023-04-22 DIAGNOSIS — M25551 Pain in right hip: Secondary | ICD-10-CM

## 2023-04-22 DIAGNOSIS — F410 Panic disorder [episodic paroxysmal anxiety] without agoraphobia: Secondary | ICD-10-CM

## 2023-04-22 DIAGNOSIS — M79641 Pain in right hand: Secondary | ICD-10-CM | POA: Diagnosis not present

## 2023-04-22 DIAGNOSIS — M2550 Pain in unspecified joint: Secondary | ICD-10-CM

## 2023-04-22 DIAGNOSIS — R5383 Other fatigue: Secondary | ICD-10-CM

## 2023-04-22 DIAGNOSIS — G8929 Other chronic pain: Secondary | ICD-10-CM | POA: Diagnosis not present

## 2023-04-22 DIAGNOSIS — E782 Mixed hyperlipidemia: Secondary | ICD-10-CM

## 2023-04-22 DIAGNOSIS — M25552 Pain in left hip: Secondary | ICD-10-CM

## 2023-04-22 DIAGNOSIS — E041 Nontoxic single thyroid nodule: Secondary | ICD-10-CM

## 2023-04-22 DIAGNOSIS — M791 Myalgia, unspecified site: Secondary | ICD-10-CM

## 2023-04-22 DIAGNOSIS — Z8659 Personal history of other mental and behavioral disorders: Secondary | ICD-10-CM

## 2023-04-22 DIAGNOSIS — Z90710 Acquired absence of both cervix and uterus: Secondary | ICD-10-CM

## 2023-04-22 DIAGNOSIS — G5620 Lesion of ulnar nerve, unspecified upper limb: Secondary | ICD-10-CM

## 2023-04-22 DIAGNOSIS — M47816 Spondylosis without myelopathy or radiculopathy, lumbar region: Secondary | ICD-10-CM

## 2023-04-22 DIAGNOSIS — I10 Essential (primary) hypertension: Secondary | ICD-10-CM

## 2023-04-22 DIAGNOSIS — M5416 Radiculopathy, lumbar region: Secondary | ICD-10-CM

## 2023-04-22 DIAGNOSIS — I428 Other cardiomyopathies: Secondary | ICD-10-CM

## 2023-04-22 NOTE — Patient Instructions (Addendum)
Osteoarthritis  Osteoarthritis is a type of arthritis. It refers to joint pain or joint disease. Osteoarthritis affects tissue that covers the ends of bones in joints (cartilage). Cartilage acts as a cushion between the bones and helps them move smoothly. Osteoarthritis occurs when cartilage in the joints gets worn down. Osteoarthritis is sometimes called "wear and tear" arthritis. Osteoarthritis is the most common form of arthritis. It often occurs in older people. It is a condition that gets worse over time. The joints most often affected by this condition are in the fingers, toes, hips, knees, and spine, including the neck and lower back. What are the causes? This condition is caused by the wearing down of cartilage that covers the ends of bones. What increases the risk? The following factors may make you more likely to develop this condition: Being age 25 or older. Obesity. Overuse of joints. Past injury of a joint. Past surgery on a joint. Family history of osteoarthritis. What are the signs or symptoms? The main symptoms of this condition are pain, swelling, and stiffness in the joint. Other symptoms may include: An enlarged joint. More pain and further damage caused by small pieces of bone or cartilage that break off and float inside of the joint. Small deposits of bone (osteophytes) that grow on the edges of the joint. A grating or scraping feeling inside the joint when you move it. Popping or creaking sounds when you move. Difficulty walking or exercising. An inability to grip items, twist your hand, or control the movements of your hands and fingers. How is this diagnosed? This condition may be diagnosed based on: Your medical history. A physical exam. Your symptoms. X-rays of the affected joints. Blood tests to rule out other types of arthritis. How is this treated? There is no cure for this condition, but treatment can help control pain and improve joint function. Treatment  may include a combination of therapies, such as: Pain relief techniques, such as: Applying heat and cold to the joint. Massage. A form of talk therapy called cognitive behavioral therapy (CBT). This therapy helps you set goals and follow up on the changes that you make. Medicines for pain and inflammation. The medicines can be taken by mouth or applied to the skin. They include: NSAIDs, such as ibuprofen. Prescription medicines. Strong anti-inflammatory medicines (corticosteroids). Certain nutritional supplements. A prescribed exercise program. You may work with a physical therapist. Assistive devices, such as a brace, wrap, splint, specialized glove, or cane. A weight control plan. Surgery, such as: An osteotomy. This is done to reposition the bones and relieve pain or to remove loose pieces of bone and cartilage. Joint replacement surgery. You may need this surgery if you have advanced osteoarthritis. Follow these instructions at home: Activity Rest your affected joints as told by your health care provider. Exercise as told by your provider. The provider may recommend specific types of exercise, such as: Strengthening exercises. These are done to strengthen the muscles that support joints affected by arthritis. Aerobic activities. These are exercises, such as brisk walking or water aerobics, that increase your heart rate. Range-of-motion activities. These help your joints move more easily. Balance and agility exercises. Managing pain, stiffness, and swelling     If told, apply heat to the affected area as often as told by your provider. Use the heat source that your provider recommends, such as a moist heat pack or a heating pad. If you have a removable assistive device, remove it as told by your provider. Place a  towel between your skin and the heat source. If your provider tells you to keep the assistive device on while you apply heat, place a towel between the assistive device and  the heat source. Leave the heat on for 20-30 minutes. If told, put ice on the affected area. If you have a removable assistive device, remove it as told by your provider. Put ice in a plastic bag. Place a towel between your skin and the bag. If your provider tells you to keep the assistive device on during icing, place a towel between the assistive device and the bag. Leave the ice on for 20 minutes, 2-3 times a day. If your skin turns bright red, remove the ice or heat right away to prevent skin damage. The risk of damage is higher if you cannot feel pain, heat, or cold. Move your fingers or toes often to reduce stiffness and swelling. Raise (elevate) the affected area above the level of your heart while you are sitting or lying down. General instructions Take over-the-counter and prescription medicines only as told by your provider. Maintain a healthy weight. Follow instructions from your provider for weight control. Do not use any products that contain nicotine or tobacco. These products include cigarettes, chewing tobacco, and vaping devices, such as e-cigarettes. If you need help quitting, ask your provider. Use assistive devices as told by your provider. Where to find more information [employer] of Arthritis and Musculoskeletal and Skin Diseases: niams.http://www.myers.net/ [employer] on Aging: BaseRingTones.pl American College of Rheumatology: rheumatology.org Contact a health care provider if: You have redness, swelling, or a feeling of warmth in a joint that gets worse. You have a fever along with joint or muscle aches. You develop a rash. You have trouble doing your normal activities. You have pain that gets worse and is not relieved by pain medicine. This information is not intended to replace advice given to you by your health care provider. Make sure you discuss any questions you have with your health care provider. Document Revised: 01/28/2022 Document Reviewed:  01/28/2022 Elsevier Patient Education  2024 [employer] Hand Exercises Hand exercises can be helpful for almost anyone. They can strengthen your hands and improve flexibility and movement. The exercises can also increase blood flow to the hands. These results can make your work and daily tasks easier for you. Hand exercises can be especially helpful for people who have joint pain from arthritis or nerve damage from using their hands over and over. These exercises can also help people who injure a hand. Exercises Most of these hand exercises are gentle stretching and motion exercises. It is usually safe to do them often throughout the day. Warming up your hands before exercise may help reduce stiffness. You can do this with gentle massage or by placing your hands in warm water for 10-15 minutes. It is normal to feel some stretching, pulling, tightness, or mild discomfort when you begin new exercises. In time, this will improve. Remember to always be careful and stop right away if you feel sudden, very bad pain or your pain gets worse. You want to get better and be safe. Ask your health care provider which exercises are safe for you. Do exercises exactly as told by your provider and adjust them as told. Do not begin these exercises until told by your provider. Knuckle bend or "claw" fist  Stand or sit with your arm, hand, and all five fingers pointed straight up. Make sure to keep your wrist straight. Gently bend  your fingers down toward your palm until the tips of your fingers are touching your palm. Keep your big knuckle straight and only bend the small knuckles in your fingers. Hold this position for 10 seconds. Straighten your fingers back to your starting position. Repeat this exercise 5-10 times with each hand. Full finger fist  Stand or sit with your arm, hand, and all five fingers pointed straight up. Make sure to keep your wrist straight. Gently bend your fingers into your palm until  the tips of your fingers are touching the middle of your palm. Hold this position for 10 seconds. Extend your fingers back to your starting position, stretching every joint fully. Repeat this exercise 5-10 times with each hand. Straight fist  Stand or sit with your arm, hand, and all five fingers pointed straight up. Make sure to keep your wrist straight. Gently bend your fingers at the big knuckle, where your fingers meet your hand, and at the middle knuckle. Keep the knuckle at the tips of your fingers straight and try to touch the bottom of your palm. Hold this position for 10 seconds. Extend your fingers back to your starting position, stretching every joint fully. Repeat this exercise 5-10 times with each hand. Tabletop  Stand or sit with your arm, hand, and all five fingers pointed straight up. Make sure to keep your wrist straight. Gently bend your fingers at the big knuckle, where your fingers meet your hand, as far down as you can. Keep the small knuckles in your fingers straight. Think of forming a tabletop with your fingers. Hold this position for 10 seconds. Extend your fingers back to your starting position, stretching every joint fully. Repeat this exercise 5-10 times with each hand. Finger spread  Place your hand flat on a table with your palm facing down. Make sure your wrist stays straight. Spread your fingers and thumb apart from each other as far as you can until you feel a gentle stretch. Hold this position for 10 seconds. Bring your fingers and thumb tight together again. Hold this position for 10 seconds. Repeat this exercise 5-10 times with each hand. Making circles  Stand or sit with your arm, hand, and all five fingers pointed straight up. Make sure to keep your wrist straight. Make a circle by touching the tip of your thumb to the tip of your index finger. Hold for 10 seconds. Then open your hand wide. Repeat this motion with your thumb and each of your  fingers. Repeat this exercise 5-10 times with each hand. Thumb motion  Sit with your forearm resting on a table and your wrist straight. Your thumb should be facing up toward the ceiling. Keep your fingers relaxed as you move your thumb. Lift your thumb up as high as you can toward the ceiling. Hold for 10 seconds. Bend your thumb across your palm as far as you can, reaching the tip of your thumb for the small finger (pinkie) side of your palm. Hold for 10 seconds. Repeat this exercise 5-10 times with each hand. Grip strengthening  Hold a stress ball or other soft ball in the middle of your hand. Slowly increase the pressure, squeezing the ball as much as you can without causing pain. Think of bringing the tips of your fingers into the middle of your palm. All of your finger joints should bend when doing this exercise. Hold your squeeze for 10 seconds, then relax. Repeat this exercise 5-10 times with each hand. Contact a health care  provider if: Your hand pain or discomfort gets much worse when you do an exercise. Your hand pain or discomfort does not improve within 2 hours after you exercise. If you have either of these problems, stop doing these exercises right away. Do not do them again unless your provider says that you can. Get help right away if: You develop sudden, severe hand pain or swelling. If this happens, stop doing these exercises right away. Do not do them again unless your provider says that you can. This information is not intended to replace advice given to you by your health care provider. Make sure you discuss any questions you have with your health care provider. Document Revised: 06/15/2022 Document Reviewed: 06/15/2022 Elsevier Patient Education  2024 [employer].

## 2023-04-26 LAB — CBC WITH DIFFERENTIAL/PLATELET
Absolute Lymphocytes: 1283 {cells}/uL (ref 850–3900)
Absolute Monocytes: 496 {cells}/uL (ref 200–950)
Basophils Absolute: 31 {cells}/uL (ref 0–200)
Basophils Relative: 0.5 %
Eosinophils Absolute: 112 {cells}/uL (ref 15–500)
Eosinophils Relative: 1.8 %
HCT: 39.9 % (ref 35.0–45.0)
Hemoglobin: 13 g/dL (ref 11.7–15.5)
MCH: 29.1 pg (ref 27.0–33.0)
MCHC: 32.6 g/dL (ref 32.0–36.0)
MCV: 89.5 fL (ref 80.0–100.0)
MPV: 9.2 fL (ref 7.5–12.5)
Monocytes Relative: 8 %
Neutro Abs: 4278 {cells}/uL (ref 1500–7800)
Neutrophils Relative %: 69 %
Platelets: 373 10*3/uL (ref 140–400)
RBC: 4.46 10*6/uL (ref 3.80–5.10)
RDW: 13.2 % (ref 11.0–15.0)
Total Lymphocyte: 20.7 %
WBC: 6.2 10*3/uL (ref 3.8–10.8)

## 2023-04-26 LAB — PROTEIN ELECTROPHORESIS, SERUM, WITH REFLEX
Albumin ELP: 4.4 g/dL (ref 3.8–4.8)
Alpha 1: 0.2 g/dL (ref 0.2–0.3)
Alpha 2: 0.6 g/dL (ref 0.5–0.9)
Beta 2: 0.4 g/dL (ref 0.2–0.5)
Beta Globulin: 0.5 g/dL (ref 0.4–0.6)
Gamma Globulin: 0.8 g/dL (ref 0.8–1.7)
Total Protein: 6.9 g/dL (ref 6.1–8.1)

## 2023-04-26 LAB — COMPLETE METABOLIC PANEL WITH GFR
AG Ratio: 1.8 (calc) (ref 1.0–2.5)
ALT: 17 U/L (ref 6–29)
AST: 18 U/L (ref 10–35)
Albumin: 4.5 g/dL (ref 3.6–5.1)
Alkaline phosphatase (APISO): 67 U/L (ref 37–153)
BUN: 16 mg/dL (ref 7–25)
CO2: 27 mmol/L (ref 20–32)
Calcium: 9.9 mg/dL (ref 8.6–10.4)
Chloride: 104 mmol/L (ref 98–110)
Creat: 0.57 mg/dL (ref 0.50–1.03)
Globulin: 2.5 g/dL (ref 1.9–3.7)
Glucose, Bld: 81 mg/dL (ref 65–99)
Potassium: 4.3 mmol/L (ref 3.5–5.3)
Sodium: 139 mmol/L (ref 135–146)
Total Bilirubin: 0.4 mg/dL (ref 0.2–1.2)
Total Protein: 7 g/dL (ref 6.1–8.1)
eGFR: 108 mL/min/{1.73_m2} (ref >=60)

## 2023-04-26 LAB — ANA: Anti Nuclear Antibody (ANA): POSITIVE — AB

## 2023-04-26 LAB — CYCLIC CITRUL PEPTIDE ANTIBODY, IGG: Cyclic Citrullin Peptide Ab: <16 U

## 2023-04-26 LAB — SEDIMENTATION RATE: Sed Rate: 9 mm/h (ref 0–30)

## 2023-04-26 LAB — ANTI-NUCLEAR AB-TITER (ANA TITER): ANA Titer 1: 1:80 {titer} — ABNORMAL HIGH

## 2023-04-26 LAB — TSH: TSH: 0.61 m[IU]/L

## 2023-04-26 LAB — CK: Total CK: 31 U/L (ref 29–143)

## 2023-04-26 LAB — RHEUMATOID FACTOR: Rheumatoid fact SerPl-aCnc: <10 [IU]/mL (ref <14)

## 2023-04-27 NOTE — Progress Notes (Signed)
ANA is low titer positive and not significant.  RF negative, anti-CCP negative.  CBC, CMP, sed rate, CK, TSH are normal.  SPEP normal.  I will discuss results at the follow-up visit.

## 2023-05-13 NOTE — Progress Notes (Signed)
Office Visit Note  Patient: Leslie Esparza             Date of Birth: 02-08-1971           MRN: [id number]             PCP: Lonie Peak, PA-C Referring: Lonie Peak, PA-C Visit Date: 05/27/2023 Occupation: @GUAROCC @  Subjective:  Pain in multiple joints  History of Present Illness: Leslie Esparza is a 52 y.o. female with polyarthralgia, degenerative disc disease, myalgia and chronic pain syndrome.  She returns today after her last visit on April 22, 2023.  Patient states she continues to have pain and stiffness in her both hands.  She has discomfort in her hips and her feet.  She continues to have lower back pain.  She denies any radiculopathy.  She has generalized achiness in her muscles.  She lives in constant discomfort.  She denies any history of joint swelling.    Activities of Daily Living:  Patient reports morning stiffness for all day. Patient Reports nocturnal pain.  Difficulty dressing/grooming: Denies Difficulty climbing stairs: Reports Difficulty getting out of chair: Reports Difficulty using hands for taps, buttons, cutlery, and/or writing: Reports  Review of Systems  Constitutional:  Positive for fatigue.  HENT:  Positive for mouth sores. Negative for mouth dryness.   Eyes:  Negative for dryness.  Respiratory:  Negative for shortness of breath.   Cardiovascular:  Negative for chest pain and palpitations.  Gastrointestinal:  Negative for blood in stool, constipation and diarrhea.  Endocrine: Positive for increased urination.  Genitourinary:  Negative for involuntary urination.  Musculoskeletal:  Positive for joint pain, gait problem, joint pain, myalgias, morning stiffness, muscle tenderness and myalgias. Negative for joint swelling and muscle weakness.  Skin:  Negative for color change, rash, hair loss and sensitivity to sunlight.  Allergic/Immunologic: Negative for susceptible to infections.  Neurological:  Negative for dizziness and headaches.   Hematological:  Negative for swollen glands.  Psychiatric/Behavioral:  Positive for sleep disturbance. Negative for depressed mood. The patient is not nervous/anxious.     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/2021   Chronic pain syndrome 08/11/2020   Lumbar radiculopathy 08/11/2020   Essential hypertension 04/23/2020   Depression 04/23/2020   Hx of migraine headaches 04/23/2020   Congestive heart failure (HCC) 03/22/2020   HFrEF (heart failure with reduced ejection fraction) (HCC)    Sacroiliac joint pain 03/28/2019   Pain in pelvis 01/11/2019   CAP (community acquired pneumonia) 04/25/2014   Cough 04/24/2014    Past Medical History:  Diagnosis Date   Allergic rhinitis    Bilateral carpal tunnel syndrome    CHF (congestive heart failure) (HCC)    ef 45 to 50 % per last echo 03-03-2021   Common migraine    Complication of anesthesia    itching after c section   History of COVID-19 11/04/2020   x 3 days all symptoms resolved   Hypertension    Multiple thyroid nodules  monitored by pcp nathan conroy pa [facility], thryoid cysts also   Nonischemic cardiomyopathy (HCC)    OCD (obsessive compulsive disorder)    Osteoarthritis of thumbs    Pelvic pain    Recurrent cold sores     Family History  Problem Relation Age of Onset   Migraines Mother    Thyroid disease Father    Heart disease Father    Fibromyalgia Father    Migraines Brother    Heart  disease Paternal Grandfather    Healthy Son    Healthy Daughter    Stroke Neg Hx    Transient ischemic attack Neg Hx    Past Surgical History:  Procedure Laterality Date   CESAREAN SECTION     x2 2000 and 2004   CYSTOSCOPY N/A 06/12/2021   Procedure: CYSTOSCOPY;  Surgeon: Hoover Browns, MD;  Location: [facility];  Service: Gynecology;  Laterality: N/A;   LAPAROSCOPIC HYSTERECTOMY N/A 06/12/2021   Procedure: HYSTERECTOMY TOTAL LAPAROSCOPIC;  Surgeon: Hoover Browns, MD;  Location: [facility];  Service: Gynecology;  Laterality: N/A;   LEFT HEART CATH AND CORONARY ANGIOGRAPHY Left 02/11/2020   Procedure: LEFT HEART CATH AND CORONARY ANGIOGRAPHY;  Surgeon: Iran Ouch, MD;  Location: [facility];  Service: Cardiovascular;  Laterality: Left;   WISDOM TOOTH EXTRACTION     yrs ago per pt on 06-03-2021   WRIST ARTHROSCOPY WITH CARPOMETACARPEL [facility]) ARTHROPLASTY Bilateral    2024, 2023   Social History   Social History Narrative   Lives at home with husband and children   Right handed   Caffeine: none currently   Immunization History  Administered Date(s) Administered   Influenza Split 03/14/2014     Objective: Vital Signs: BP 110/77 (BP Location: Left Arm, Patient Position: Sitting, Cuff Size: Normal)   Pulse 67   Resp 14   Ht 5' 1.5" (1.562 m)   Wt 154 lb (69.9 kg)   LMP 06/06/2021 (Approximate)   BMI 28.63 kg/m    Physical Exam Vitals and nursing note reviewed.  Constitutional:      Appearance: She is well-developed.  HENT:     Head: Normocephalic and atraumatic.  Eyes:     Conjunctiva/sclera: Conjunctivae normal.  Cardiovascular:     Rate and Rhythm: Normal rate and regular rhythm.     Heart sounds: Normal heart sounds.  Pulmonary:     Effort: Pulmonary effort is normal.     Breath sounds: Normal breath sounds.  Abdominal:     General: Bowel sounds are normal.     Palpations: Abdomen is soft.  Musculoskeletal:      Cervical back: Normal range of motion.  Lymphadenopathy:     Cervical: No cervical adenopathy.  Skin:    General: Skin is warm and dry.     Capillary Refill: Capillary refill takes less than 2 seconds.  Neurological:     Mental Status: She is alert and oriented to person, place, and time.  Psychiatric:        Behavior: Behavior normal.      Musculoskeletal Exam: Cervical, thoracic and lumbar spine were in good range of motion.  She had discomfort range of motion of the lumbar spine.  Shoulders, elbows, wrist joints, MCPs PIPs and DIPs with good range of motion.  She had postsurgical scars over bilateral CMC joints.  PIP and DIP thickening was noted.  No synovitis was noted.  Hip joints and knee joints in good range of motion without any  warmth swelling or effusion.  There was no tenderness over ankles or MTPs.  CDAI Exam: CDAI Score: -- Patient Global: --; Provider Global: -- Swollen: --; Tender: -- Joint Exam 05/27/2023   No joint exam has been documented for this visit   There is currently no information documented on the homunculus. Go to the Rheumatology activity and complete the homunculus joint exam.  Investigation: No additional findings.  Imaging: No results found.  Recent Labs: Lab Results  Component Value Date   WBC 6.2 04/22/2023   HGB 13.0 04/22/2023   PLT 373 04/22/2023   NA 139 04/22/2023   K 4.3 04/22/2023   CL 104 04/22/2023   CO2 27 04/22/2023   GLUCOSE 81 04/22/2023   BUN 16 04/22/2023   CREATININE 0.57 04/22/2023   BILITOT 0.4 04/22/2023   ALKPHOS 60 07/10/2021   AST 18 04/22/2023   ALT 17 04/22/2023   PROT 7.0 04/22/2023   PROT 6.9 04/22/2023   ALBUMIN 3.5 07/10/2021   CALCIUM 9.9 04/22/2023   GFRAA 117 03/06/2020   April 22, 2023 SPEP normal, CK 31, sed rate 9, TSH normal, RF negative, anti-CCP negative, ANA 1: 80 cytoplasmic 01/16/21: dsDNA-, Ro-, La-, Sm-, anti-CCP 7. 10/29/22: RF 10.8, ESR 13, uric acid 4.7   Speciality Comments: No  specialty comments available.  Procedures:  No procedures performed Allergies: Bactrim [sulfamethoxazole-trimethoprim] and Penicillins   Assessment / Plan:     Visit Diagnoses: Polyarthralgia - All autoimmune labs negative except for ANA low titer positive which is not significant.  Patient denies any history of oral ulcers, nasal ulcers, malar rash, photosensitivity, Raynaud's, inflammatory arthritis or lymphadenopathy.  History of chronic pain since 2000.  I advised her to contact me if she develops any new symptoms.  Primary osteoarthritis of both hands -she had bilateral PIP and DIP thickening.  No synovitis was noted.  Clinical and radiographic findings were suggestive of osteoarthritis.  She had bilateral CMC surgery.  X-rays obtained at the last visit showed bilateral PIP and DIP narrowing.  X-ray findings were reviewed with the patient.  Joint protection muscle strengthening was discussed.  A handout on hand exercises was given.  Use of natural anti-inflammatories was advised.  Ulnar nerve entrapment at elbow, bilateral  Bilateral carpal tunnel syndrome - Left carpal tunnel release and left [facility] surgery July 2023, right carpal tunnel release and right [facility] surgery August 2024 by Dr. Yehuda Budd at [facility]  Chronic pain of both hips -she continues to have some discomfort in her hips.  X-rays obtained at the last visit of bilateral hips were unremarkable.  X-ray findings were reviewed with the patient.  Pain in both feet -she has discomfort in the bilateral feet.  X-rays obtained at the last visit were suggestive of osteoarthritis.  X-rays were reviewed with the patient.  X-rays showed dorsal spurring and inferior calcaneal spur.  Bilateral PIP and DIP narrowing was noted.  Proper fitting shoes with arch support were placed.  Lumbar spondylosis - Lower back pain for many years.  Mild spondylosis and facet joint arthropathy was noted on the x-rays obtained at the last visit.  X-rays were  reviewed with the patient.  I will refer her to physical therapy.  Patient would like to go to [facility] in [place].  Myalgia -she continues to have generalized pain and hyperalgesia.  CK normal.  Other fatigue - History of chronic fatigue.  Chronic pain syndrome - Treated with naltrexone.  Other medical problems are listed as follows:  HFrEF (heart failure  with reduced ejection fraction) (HCC)  NICM (nonischemic cardiomyopathy) (HCC)  Mixed hyperlipidemia  Essential hypertension  History of OCD (obsessive compulsive disorder)  Panic disorder  Thyroid nodule  S/P total hysterectomy  Orders: Orders Placed This Encounter  Procedures   Ambulatory referral to Physical Therapy   No orders of the defined types were placed in this encounter.    Follow-Up Instructions: Return in about 6 months (around 11/25/2023) for Osteoarthritis.   Pollyann Savoy, MD  Note - This record has been created using Animal nutritionist.  Chart creation errors have been sought, but may not always  have been located. Such creation errors do not reflect on  the standard of medical care.

## 2023-05-27 ENCOUNTER — Ambulatory Visit: Payer: 59 | Attending: Rheumatology | Admitting: Rheumatology

## 2023-05-27 ENCOUNTER — Encounter: Payer: Self-pay | Admitting: Rheumatology

## 2023-05-27 VITALS — BP 110/77 | HR 67 | Resp 14 | Ht 61.5 in | Wt 154.0 lb

## 2023-05-27 DIAGNOSIS — M79672 Pain in left foot: Secondary | ICD-10-CM

## 2023-05-27 DIAGNOSIS — Z8659 Personal history of other mental and behavioral disorders: Secondary | ICD-10-CM

## 2023-05-27 DIAGNOSIS — I10 Essential (primary) hypertension: Secondary | ICD-10-CM

## 2023-05-27 DIAGNOSIS — G894 Chronic pain syndrome: Secondary | ICD-10-CM

## 2023-05-27 DIAGNOSIS — M47816 Spondylosis without myelopathy or radiculopathy, lumbar region: Secondary | ICD-10-CM

## 2023-05-27 DIAGNOSIS — F410 Panic disorder [episodic paroxysmal anxiety] without agoraphobia: Secondary | ICD-10-CM

## 2023-05-27 DIAGNOSIS — E041 Nontoxic single thyroid nodule: Secondary | ICD-10-CM

## 2023-05-27 DIAGNOSIS — I5020 Unspecified systolic (congestive) heart failure: Secondary | ICD-10-CM

## 2023-05-27 DIAGNOSIS — M2550 Pain in unspecified joint: Secondary | ICD-10-CM

## 2023-05-27 DIAGNOSIS — G5620 Lesion of ulnar nerve, unspecified upper limb: Secondary | ICD-10-CM | POA: Diagnosis not present

## 2023-05-27 DIAGNOSIS — R5383 Other fatigue: Secondary | ICD-10-CM

## 2023-05-27 DIAGNOSIS — I428 Other cardiomyopathies: Secondary | ICD-10-CM

## 2023-05-27 DIAGNOSIS — M25552 Pain in left hip: Secondary | ICD-10-CM

## 2023-05-27 DIAGNOSIS — Z90710 Acquired absence of both cervix and uterus: Secondary | ICD-10-CM

## 2023-05-27 DIAGNOSIS — G8929 Other chronic pain: Secondary | ICD-10-CM

## 2023-05-27 DIAGNOSIS — G5603 Carpal tunnel syndrome, bilateral upper limbs: Secondary | ICD-10-CM | POA: Diagnosis not present

## 2023-05-27 DIAGNOSIS — M19042 Primary osteoarthritis, left hand: Secondary | ICD-10-CM

## 2023-05-27 DIAGNOSIS — E782 Mixed hyperlipidemia: Secondary | ICD-10-CM

## 2023-05-27 DIAGNOSIS — M25551 Pain in right hip: Secondary | ICD-10-CM

## 2023-05-27 DIAGNOSIS — M791 Myalgia, unspecified site: Secondary | ICD-10-CM

## 2023-05-27 DIAGNOSIS — M79671 Pain in right foot: Secondary | ICD-10-CM

## 2023-05-27 DIAGNOSIS — M19041 Primary osteoarthritis, right hand: Secondary | ICD-10-CM | POA: Diagnosis not present

## 2023-05-27 NOTE — Patient Instructions (Signed)
Hand Exercises Hand exercises can be helpful for almost anyone. They can strengthen your hands and improve flexibility and movement. The exercises can also increase blood flow to the hands. These results can make your work and daily tasks easier for you. Hand exercises can be especially helpful for people who have joint pain from arthritis or nerve damage from using their hands over and over. These exercises can also help people who injure a hand. Exercises Most of these hand exercises are gentle stretching and motion exercises. It is usually safe to do them often throughout the day. Warming up your hands before exercise may help reduce stiffness. You can do this with gentle massage or by placing your hands in warm water for 10-15 minutes. It is normal to feel some stretching, pulling, tightness, or mild discomfort when you begin new exercises. In time, this will improve. Remember to always be careful and stop right away if you feel sudden, very bad pain or your pain gets worse. You want to get better and be safe. Ask your health care provider which exercises are safe for you. Do exercises exactly as told by your provider and adjust them as told. Do not begin these exercises until told by your provider. Knuckle bend or "claw" fist  Stand or sit with your arm, hand, and all five fingers pointed straight up. Make sure to keep your wrist straight. Gently bend your fingers down toward your palm until the tips of your fingers are touching your palm. Keep your big knuckle straight and only bend the small knuckles in your fingers. Hold this position for 10 seconds. Straighten your fingers back to your starting position. Repeat this exercise 5-10 times with each hand. Full finger fist  Stand or sit with your arm, hand, and all five fingers pointed straight up. Make sure to keep your wrist straight. Gently bend your fingers into your palm until the tips of your fingers are touching the middle of your  palm. Hold this position for 10 seconds. Extend your fingers back to your starting position, stretching every joint fully. Repeat this exercise 5-10 times with each hand. Straight fist  Stand or sit with your arm, hand, and all five fingers pointed straight up. Make sure to keep your wrist straight. Gently bend your fingers at the big knuckle, where your fingers meet your hand, and at the middle knuckle. Keep the knuckle at the tips of your fingers straight and try to touch the bottom of your palm. Hold this position for 10 seconds. Extend your fingers back to your starting position, stretching every joint fully. Repeat this exercise 5-10 times with each hand. Tabletop  Stand or sit with your arm, hand, and all five fingers pointed straight up. Make sure to keep your wrist straight. Gently bend your fingers at the big knuckle, where your fingers meet your hand, as far down as you can. Keep the small knuckles in your fingers straight. Think of forming a tabletop with your fingers. Hold this position for 10 seconds. Extend your fingers back to your starting position, stretching every joint fully. Repeat this exercise 5-10 times with each hand. Finger spread  Place your hand flat on a table with your palm facing down. Make sure your wrist stays straight. Spread your fingers and thumb apart from each other as far as you can until you feel a gentle stretch. Hold this position for 10 seconds. Bring your fingers and thumb tight together again. Hold this position for 10 seconds. Repeat  this exercise 5-10 times with each hand. Making circles  Stand or sit with your arm, hand, and all five fingers pointed straight up. Make sure to keep your wrist straight. Make a circle by touching the tip of your thumb to the tip of your index finger. Hold for 10 seconds. Then open your hand wide. Repeat this motion with your thumb and each of your fingers. Repeat this exercise 5-10 times with each hand. Thumb  motion  Sit with your forearm resting on a table and your wrist straight. Your thumb should be facing up toward the ceiling. Keep your fingers relaxed as you move your thumb. Lift your thumb up as high as you can toward the ceiling. Hold for 10 seconds. Bend your thumb across your palm as far as you can, reaching the tip of your thumb for the small finger (pinkie) side of your palm. Hold for 10 seconds. Repeat this exercise 5-10 times with each hand. Grip strengthening  Hold a stress ball or other soft ball in the middle of your hand. Slowly increase the pressure, squeezing the ball as much as you can without causing pain. Think of bringing the tips of your fingers into the middle of your palm. All of your finger joints should bend when doing this exercise. Hold your squeeze for 10 seconds, then relax. Repeat this exercise 5-10 times with each hand. Contact a health care provider if: Your hand pain or discomfort gets much worse when you do an exercise. Your hand pain or discomfort does not improve within 2 hours after you exercise. If you have either of these problems, stop doing these exercises right away. Do not do them again unless your provider says that you can. Get help right away if: You develop sudden, severe hand pain or swelling. If this happens, stop doing these exercises right away. Do not do them again unless your provider says that you can. This information is not intended to replace advice given to you by your health care provider. Make sure you discuss any questions you have with your health care provider. Document Revised: 06/15/2022 Document Reviewed: 06/15/2022 Elsevier Patient Education  2024 [employer] Low Back Sprain or Strain Rehab Ask your health care provider which exercises are safe for you. Do exercises exactly as told by your health care provider and adjust them as directed. It is normal to feel mild stretching, pulling, tightness, or discomfort as you do these  exercises. Stop right away if you feel sudden pain or your pain gets worse. Do not begin these exercises until told by your health care provider. Stretching and range-of-motion exercises These exercises warm up your muscles and joints and improve the movement and flexibility of your back. These exercises also help to relieve pain, numbness, and tingling. Lumbar rotation  Lie on your back on a firm bed or the floor with your knees bent. Straighten your arms out to your sides so each arm forms a 90-degree angle (right angle) with a side of your body. Slowly move (rotate) both of your knees to one side of your body until you feel a stretch in your lower back (lumbar). Try not to let your shoulders lift off the floor. Hold this position for __________ seconds. Tense your abdominal muscles and slowly move your knees back to the starting position. Repeat this exercise on the other side of your body. Repeat __________ times. Complete this exercise __________ times a day. Single knee to chest  Lie on your back on a  firm bed or the floor with both legs straight. Bend one of your knees. Use your hands to move your knee up toward your chest until you feel a gentle stretch in your lower back and buttock. Hold your leg in this position by holding on to the front of your knee. Keep your other leg as straight as possible. Hold this position for __________ seconds. Slowly return to the starting position. Repeat with your other leg. Repeat __________ times. Complete this exercise __________ times a day. Prone extension on elbows  Lie on your abdomen on a firm bed or the floor (prone position). Prop yourself up on your elbows. Use your arms to help lift your chest up until you feel a gentle stretch in your abdomen and your lower back. This will place some of your body weight on your elbows. If this is uncomfortable, try stacking pillows under your chest. Your hips should stay down, against the surface that  you are lying on. Keep your hip and back muscles relaxed. Hold this position for __________ seconds. Slowly relax your upper body and return to the starting position. Repeat __________ times. Complete this exercise __________ times a day. Strengthening exercises These exercises build strength and endurance in your back. Endurance is the ability to use your muscles for a long time, even after they get tired. Pelvic tilt This exercise strengthens the muscles that lie deep in the abdomen. Lie on your back on a firm bed or the floor with your legs extended. Bend your knees so they are pointing toward the ceiling and your feet are flat on the floor. Tighten your lower abdominal muscles to press your lower back against the floor. This motion will tilt your pelvis so your tailbone points up toward the ceiling instead of pointing to your feet or the floor. To help with this exercise, you may place a small towel under your lower back and try to push your back into the towel. Hold this position for __________ seconds. Let your muscles relax completely before you repeat this exercise. Repeat __________ times. Complete this exercise __________ times a day. Alternating arm and leg raises  Get on your hands and knees on a firm surface. If you are on a hard floor, you may want to use padding, such as an exercise mat, to cushion your knees. Line up your arms and legs. Your hands should be directly below your shoulders, and your knees should be directly below your hips. Lift your left leg behind you. At the same time, raise your right arm and straighten it in front of you. Do not lift your leg higher than your hip. Do not lift your arm higher than your shoulder. Keep your abdominal and back muscles tight. Keep your hips facing the ground. Do not arch your back. Keep your balance carefully, and do not hold your breath. Hold this position for __________ seconds. Slowly return to the starting  position. Repeat with your right leg and your left arm. Repeat __________ times. Complete this exercise __________ times a day. Abdominal set with straight leg raise  Lie on your back on a firm bed or the floor. Bend one of your knees and keep your other leg straight. Tense your abdominal muscles and lift your straight leg up, 4-6 inches (10-15 cm) off the ground. Keep your abdominal muscles tight and hold this position for __________ seconds. Do not hold your breath. Do not arch your back. Keep it flat against the ground. Keep your abdominal muscles  tense as you slowly lower your leg back to the starting position. Repeat with your other leg. Repeat __________ times. Complete this exercise __________ times a day. Single leg lower with bent knees Lie on your back on a firm bed or the floor. Tense your abdominal muscles and lift your feet off the floor, one foot at a time, so your knees and hips are bent in 90-degree angles (right angles). Your knees should be over your hips and your lower legs should be parallel to the floor. Keeping your abdominal muscles tense and your knee bent, slowly lower one of your legs so your toe touches the ground. Lift your leg back up to return to the starting position. Do not hold your breath. Do not let your back arch. Keep your back flat against the ground. Repeat with your other leg. Repeat __________ times. Complete this exercise __________ times a day. Posture and body mechanics Good posture and healthy body mechanics can help to relieve stress in your body's tissues and joints. Body mechanics refers to the movements and positions of your body while you do your daily activities. Posture is part of body mechanics. Good posture means: Your spine is in its natural S-curve position (neutral). Your shoulders are pulled back slightly. Your head is not tipped forward (neutral). Follow these guidelines to improve your posture and body mechanics in your everyday  activities. Standing  When standing, keep your spine neutral and your feet about hip-width apart. Keep a slight bend in your knees. Your ears, shoulders, and hips should line up. When you do a task in which you stand in one place for a long time, place one foot up on a stable object that is 2-4 inches (5-10 cm) high, such as a footstool. This helps keep your spine neutral. Sitting  When sitting, keep your spine neutral and keep your feet flat on the floor. Use a footrest, if necessary, and keep your thighs parallel to the floor. Avoid rounding your shoulders, and avoid tilting your head forward. When working at a desk or a computer, keep your desk at a height where your hands are slightly lower than your elbows. Slide your chair under your desk so you are close enough to maintain good posture. When working at a computer, place your monitor at a height where you are looking straight ahead and you do not have to tilt your head forward or downward to look at the screen. Resting When lying down and resting, avoid positions that are most painful for you. If you have pain with activities such as sitting, bending, stooping, or squatting, lie in a position in which your body does not bend very much. For example, avoid curling up on your side with your arms and knees near your chest (fetal position). If you have pain with activities such as standing for a long time or reaching with your arms, lie with your spine in a neutral position and bend your knees slightly. Try the following positions: Lying on your side with a pillow between your knees. Lying on your back with a pillow under your knees. Lifting  When lifting objects, keep your feet at least shoulder-width apart and tighten your abdominal muscles. Bend your knees and hips and keep your spine neutral. It is important to lift using the strength of your legs, not your back. Do not lock your knees straight out. Always ask for help to lift heavy or  awkward objects. This information is not intended to replace advice given  to you by your health care provider. Make sure you discuss any questions you have with your health care provider. Document Revised: 10/04/2022 Document Reviewed: 08/18/2020 Elsevier Patient Education  2024 [employer].

## 2023-06-22 ENCOUNTER — Other Ambulatory Visit: Payer: Self-pay

## 2023-06-22 MED ORDER — METOPROLOL SUCCINATE ER 25 MG PO TB24: 25.0000 mg | ORAL_TABLET | Freq: Every day | ORAL | 2 refills | Status: DC

## 2023-06-23 ENCOUNTER — Encounter: Payer: Self-pay | Admitting: Internal Medicine

## 2023-06-23 ENCOUNTER — Ambulatory Visit: Payer: 59 | Attending: Internal Medicine | Admitting: Internal Medicine

## 2023-06-23 VITALS — BP 123/79 | HR 71 | Ht 62.0 in | Wt 154.6 lb

## 2023-06-23 DIAGNOSIS — I428 Other cardiomyopathies: Secondary | ICD-10-CM

## 2023-06-23 MED ORDER — ENTRESTO 24-26 MG PO TABS: 1.0000 | ORAL_TABLET | Freq: Two times a day (BID) | ORAL | 3 refills | Status: DC

## 2023-06-23 NOTE — Progress Notes (Signed)
 Patient Care Team: Montey Lot, PA-C as PCP - General (Physician Assistant) Darliss Rogue, MD as PCP - Cardiology (Cardiology)   HPI  Leslie Esparza is a 53 y.o. female seen if followup for abnormal and progressively evolving (see below) ECG in setting of nonischemic cardiomyopathy  Doing well.  Not exercising but her ADLs no issues of chest pain or shortness of breath.  She notes that she has history of carpal tunnel and back pain.  She wonders whether she has amyloid.  She is following up with the heart failure clinic.  Her see MRI was not suggestive 2 years ago albeit  The patient denies chest pain, shortness of breath, nocturnal dyspnea, orthopnea or peripheral edema.  There have been no palpitations or syncope.   Lightheadedness prompted a down titration of her Entresto  from 49/51--24/26 with improvement DATE TEST EF    8/21 Echo   35-40 %    8/21 LHC   % No Angiogra CAD  10/21 cMRI 57% LGE neg  11/21 Echo  35-45%     9/23 Echo   45-50%               Date Cr K TChol LDL Hgb  8/21     259 165    1/22 0.63 4.3 155 96 13.3   1/23 0.61 3.8     12.6<<11.1    Date PVCs  7/21 <1%  12/21 <1%    Date HR QRSd Axis 1 L V6 EF   12/17   94  QR QR mono --   7/21 86 120  mono mono RS 35-40   10/21 70 128  mono mono RS 57   11/21 78 132 95 QR QR mono 40-45   2/22 72 130  QR QR mono     4/22 61 126 -74 QR QR mono   Fractionation   10/23 63 114  mono mono mono 45-50   10/23(2) 67 114 40 QR QR mono  fractionation  11/24 71 130 -55 mono QR RS  Some fractionation     Records and Results Reviewed   Past Medical History:  Diagnosis Date   Allergic rhinitis    Bilateral carpal tunnel syndrome    CHF (congestive heart failure) (HCC)    ef 45 to 50 % per last echo 03-03-2021   Common migraine    Complication of anesthesia    itching after c section   History of COVID-19 11/04/2020   x 3 days all symptoms resolved   Hypertension    Multiple thyroid  nodules     monitored by pcp nathan conroy pa [facility], thryoid cysts also   Nonischemic cardiomyopathy (HCC)    OCD (obsessive compulsive disorder)    Osteoarthritis of thumbs    Pelvic pain    Recurrent cold sores     Past Surgical History:  Procedure Laterality Date   CESAREAN SECTION     x2 2000 and 2004   CYSTOSCOPY N/A 06/12/2021   Procedure: CYSTOSCOPY;  Surgeon: Gloriann Chick, MD;  Location: [facility];  Service: Gynecology;  Laterality: N/A;   LAPAROSCOPIC HYSTERECTOMY N/A 06/12/2021   Procedure: HYSTERECTOMY TOTAL LAPAROSCOPIC;  Surgeon: Gloriann Chick, MD;  Location: [facility];  Service: Gynecology;  Laterality: N/A;   LEFT HEART CATH AND CORONARY ANGIOGRAPHY Left 02/11/2020   Procedure: LEFT HEART CATH AND CORONARY ANGIOGRAPHY;  Surgeon: Darron Deatrice LABOR, MD;  Location: [facility];  Service:  Cardiovascular;  Laterality: Left;   WISDOM TOOTH EXTRACTION     yrs ago per pt on 06-03-2021   WRIST ARTHROSCOPY WITH CARPOMETACARPEL [facility]) ARTHROPLASTY Bilateral    2024, 2023    Current Meds  Medication Sig   Cholecalciferol  (VITAMIN D ) 125 MCG (5000 UT) CAPS Take 5,000 Units by mouth daily.   CRANBERRY PO Take by mouth daily.   estradiol (ESTRACE) 0.1 MG/GM vaginal cream as needed.   FLUoxetine  (PROZAC ) 40 MG capsule Take 80 mg by mouth daily.   ibuprofen  (ADVIL ) 800 MG tablet Take 1 tablet (800 mg total) by mouth every 8 (eight) hours. (Patient taking differently: Take 800 mg by mouth every 6 (six) hours as needed for mild pain (pain score 1-3).)   metoprolol  succinate (TOPROL  XL) 25 MG 24 hr tablet Take 1 tablet (25 mg total) by mouth daily.   Omega-3 Fatty Acids (FISH OIL) 1200 MG CAPS 1,200 mg.   sacubitril -valsartan  (ENTRESTO ) 49-51 MG Take 1 tablet by mouth 2 (two) times daily. (Patient taking differently: Take 0.5 tablets by mouth 2 (two) times daily.)   Turmeric (QC TUMERIC COMPLEX) 500 MG CAPS Take by mouth.   valACYclovir (VALTREX)  1000 MG tablet As needed for cold sores    Allergies  Allergen Reactions   Bactrim [Sulfamethoxazole-Trimethoprim] Rash    Diffuse rash in setting of bactrim use (5 days after initiation)   Penicillins Rash    had reaction as a child      Review of Systems negative except from HPI and PMH  Physical Exam BP 123/79 (BP Location: Left Arm, Patient Position: Sitting, Cuff Size: Normal)   Pulse 71   Ht 5' 2 (1.575 m)   Wt 154 lb 9.6 oz (70.1 kg)   LMP 06/06/2021 (Approximate)   SpO2 99%   BMI 28.28 kg/m  Well developed and nourished in no acute distress HENT normal Neck supple with JVP-  flat  Clear Regular rate and rhythm, no murmurs or gallops Abd-soft with active BS No Clubbing cyanosis edema Skin-warm and dry A & Oriented  Grossly normal sensory and motor function  ECG sinus @ 71 17/13/44 PRWP LAD   CrCl cannot be calculated (Patient's most recent lab result is older than the maximum 21 days allowed.).   Assessment and  Plan Cardiomyopathy-nonischemic-mild  ECG-abnormal   Her conduction disease based on her ECG numbers remained stable although variable.  Will continue to follow.  She has an echo pending in just a couple of months.  If it remains stable continue her on GDMT.  She had some orthostasis that had prompted decreasing of her Entresto  dose from 49/51--24/26   Current medicines are reviewed at length with the patient today .  The patient does not  have concerns regarding medicines.

## 2023-06-23 NOTE — Patient Instructions (Signed)
 Medication Instructions:  Your physician has recommended you make the following change in your medication:   ** Stop Entresto  49-51mg   ** Start Entresto  24/26mg  - 1 tablet by mouth twice daily  *If you need a refill on your cardiac medications before your next appointment, please call your pharmacy*   Lab Work: None ordered.  If you have labs (blood work) drawn today and your tests are completely normal, you will receive your results only by: MyChart Message (if you have MyChart) OR A paper copy in the mail If you have any lab test that is abnormal or we need to change your treatment, we will call you to review the results.   Testing/Procedures: None ordered.    Follow-Up: At [facility], you and your health needs are our priority.  As part of our continuing mission to provide you with exceptional heart care, we have created designated Provider Care Teams.  These Care Teams include your primary Cardiologist (physician) and Advanced Practice Providers (APPs -  Physician Assistants and Nurse Practitioners) who all work together to provide you with the care you need, when you need it.  We recommend signing up for the patient portal called MyChart.  Sign up information is provided on this After Visit Summary.  MyChart is used to connect with patients for Virtual Visits (Telemedicine).  Patients are able to view lab/test results, encounter notes, upcoming appointments, etc.  Non-urgent messages can be sent to your provider as well.   To learn more about what you can do with MyChart, go to [email or web address].    Your next appointment:   12 months

## 2023-08-25 ENCOUNTER — Ambulatory Visit: Payer: 59 | Attending: Cardiology

## 2023-08-25 DIAGNOSIS — I428 Other cardiomyopathies: Secondary | ICD-10-CM

## 2023-08-25 LAB — ECHOCARDIOGRAM COMPLETE
AR max vel: 1.89 cm2
AV Area VTI: 1.9 cm2
AV Area mean vel: 1.94 cm2
AV Mean grad: 3 mmHg
AV Peak grad: 5.6 mmHg
Ao pk vel: 1.18 m/s
Area-P 1/2: 3.65 cm2
Calc EF: 48.1 %
S' Lateral: 3.5 cm
Single Plane A2C EF: 48.3 %
Single Plane A4C EF: 47.4 %

## 2023-10-25 ENCOUNTER — Ambulatory Visit: Attending: Cardiology | Admitting: Cardiology

## 2023-10-25 ENCOUNTER — Encounter: Payer: Self-pay | Admitting: Cardiology

## 2023-10-25 VITALS — BP 118/70 | HR 73 | Ht 61.5 in | Wt 157.8 lb

## 2023-10-25 DIAGNOSIS — I10 Essential (primary) hypertension: Secondary | ICD-10-CM | POA: Diagnosis not present

## 2023-10-25 DIAGNOSIS — I428 Other cardiomyopathies: Secondary | ICD-10-CM

## 2023-10-25 NOTE — Patient Instructions (Signed)

## 2023-10-25 NOTE — Progress Notes (Signed)
 Cardiology Office Note:    Date:  10/25/2023   ID:  Leslie Esparza, DOB [birth year], MRN [id number]  PCP:  Aloha Arnold, PA-C  [facility] Cardiologist:  Leslie Delton, MD  [facility] Electrophysiologist:  None   Referring MD: Aloha Arnold, PA-C   Chief Complaint  Patient presents with   Follow-up    12 month follow up visit. Patient is doing well on today. Meds reviewed.     History of Present Illness:    Leslie Esparza is a 52 y.o. female with a hx of NICM (initial EF 35%, last echo EF 45 to 50%), hypertension, hyperlipidemia, who presents for follow-up.    Denies chest pain or shortness of breath.  Entresto  previously reduced to 24/26 mg twice daily due to symptoms of dizziness.  Overall symptoms have improved.  She denies edema.  Doing okay, denies any concerns today.  Repeat echo on 08/2023 showed stable EF of 45 to 50%.  Prior notes Echo 3/25 EF 45 to 50% Echo 02/2021 EF 45 to 50%  She underwent left heart cath on 02/11/2020 with no evidence of CAD.  Echo on 02/01/2020 showed moderately reduced EF, 35 to 40%.  CMR 03/2020 EF 57%, no evidence of scar Echo 10/02/2020 EF 30 to 35%    Past Medical History:  Diagnosis Date   Allergic rhinitis    Bilateral carpal tunnel syndrome    CHF (congestive heart failure) (HCC)    ef 45 to 50 % per last echo 03-03-2021   Common migraine    Complication of anesthesia    itching after c section   History of COVID-19 11/04/2020   x 3 days all symptoms resolved   Hypertension    Multiple thyroid  nodules    monitored by pcp Leslie conroy pa [facility], thryoid cysts also   Nonischemic cardiomyopathy (HCC)    OCD (obsessive compulsive disorder)    Osteoarthritis of thumbs    Pelvic pain    Recurrent cold sores     Past Surgical History:  Procedure Laterality Date   CESAREAN SECTION     x2 2000 and 2004   CYSTOSCOPY N/A 06/12/2021   Procedure: CYSTOSCOPY;  Surgeon: Vernal Gold, MD;  Location: [facility];  Service: Gynecology;  Laterality: N/A;   LAPAROSCOPIC HYSTERECTOMY N/A 06/12/2021   Procedure: HYSTERECTOMY TOTAL LAPAROSCOPIC;  Surgeon: Vernal Gold, MD;  Location: [facility];  Service: Gynecology;  Laterality: N/A;   LEFT HEART CATH AND CORONARY ANGIOGRAPHY Left 02/11/2020   Procedure: LEFT HEART CATH AND CORONARY ANGIOGRAPHY;  Surgeon: Wenona Hamilton, MD;  Location: [facility];  Service: Cardiovascular;  Laterality: Left;   WISDOM TOOTH EXTRACTION     yrs ago per pt on 06-03-2021   WRIST ARTHROSCOPY WITH CARPOMETACARPEL [facility]) ARTHROPLASTY Bilateral    2024, 2023    Current Medications: Current Meds  Medication Sig   CALCIUM  CITRATE PO Take by mouth daily. 2-3 tablets a day   Cholecalciferol  (VITAMIN D ) 125 MCG (5000 UT) CAPS Take 5,000 Units by mouth daily.   CRANBERRY PO Take by mouth daily.   estradiol (ESTRACE) 0.1 MG/GM vaginal cream as needed.   FLUoxetine  (PROZAC ) 40 MG capsule Take 80 mg by mouth daily.   ibuprofen  (ADVIL ) 800 MG tablet Take 1 tablet (800 mg total) by mouth every 8 (eight) hours.   metoprolol  succinate (TOPROL  XL) 25 MG 24 hr tablet Take 1 tablet (25 mg total) by mouth daily.   Omega-3 Fatty  Acids (FISH OIL) 1200 MG CAPS 1,200 mg.   sacubitril -valsartan  (ENTRESTO ) 24-26 MG Take 1 tablet by mouth 2 (two) times daily.   Turmeric (QC TUMERIC COMPLEX) 500 MG CAPS Take by mouth.   valACYclovir (VALTREX) 1000 MG tablet As needed for cold sores     Allergies:   Bactrim [sulfamethoxazole-trimethoprim] and Penicillins   Social History   Socioeconomic History   Marital status: Married    Spouse name: Not on file   Number of children: 2   Years of education: Not on file   Highest education level: Not on file  Occupational History   Not on file  Tobacco Use   Smoking status: Never    Passive exposure: Never   Smokeless tobacco: Never  Vaping Use   Vaping status: Never Used  Substance and Sexual Activity    Alcohol use: No    Alcohol/week: 0.0 standard drinks of alcohol   Drug use: No   Sexual activity: Not on file  Other Topics Concern   Not on file  Social History Narrative   Lives at home with husband and children   Right handed   Caffeine: none currently   Social Drivers of Corporate investment banker Strain: Not on file  Food Insecurity: Not on file  Transportation Needs: Not on file  Physical Activity: Not on file  Stress: Not on file  Social Connections: Not on file     Family History: The patient's family history includes Fibromyalgia in her father; Healthy in her daughter and son; Heart disease in her father and paternal grandfather; Migraines in her brother and mother; Thyroid  disease in her father. There is no history of Stroke or Transient ischemic attack.  ROS:   Please see the history of present illness.     All other systems reviewed and are negative.  EKGs/Labs/Other Studies Reviewed:    The following studies were reviewed today:  EKG Interpretation Date/Time:  Tuesday Oct 25 2023 09:06:47 EDT Ventricular Rate:  73 PR Interval:  180 QRS Duration:  120 QT Interval:  414 QTC Calculation: 456 R Axis:   -37  Text Interpretation: Normal sinus rhythm Left axis deviation Septal infarct , age undetermined Confirmed by Leslie Esparza ([id number]) on 10/25/2023 9:12:51 AM    Recent Labs: 04/22/2023: ALT 17; BUN 16; Creat 0.57; Hemoglobin 13.0; Platelets 373; Potassium 4.3; Sodium 139; TSH 0.61  Recent Lipid Panel    Component Value Date/Time   CHOL 155 04/24/2020 0431   TRIG 78 04/24/2020 0431   HDL 43 04/24/2020 0431   CHOLHDL 3.6 04/24/2020 0431   VLDL 16 04/24/2020 0431   LDLCALC 96 04/24/2020 0431    Physical Exam:    VS:  BP 118/70   Pulse 73   Ht 5' 1.5" (1.562 m)   Wt 157 lb 12.8 oz (71.6 kg)   LMP 06/06/2021 (Approximate)   SpO2 97%   BMI 29.33 kg/m     Wt Readings from Last 3 Encounters:  10/25/23 157 lb 12.8 oz (71.6 kg)  06/23/23 154 lb  9.6 oz (70.1 kg)  05/27/23 154 lb (69.9 kg)     GEN:  Well nourished, well developed in no acute distress HEENT: Normal NECK: No JVD; No carotid bruits CARDIAC: RRR, no murmurs, rubs, gallops RESPIRATORY:  Clear to auscultation without rales, wheezing or rhonchi  ABDOMEN: Soft, non-tender, non-distended MUSCULOSKELETAL:  No edema; No deformity  SKIN: Warm and dry NEUROLOGIC:  Alert and oriented x 3 PSYCHIATRIC:  Normal affect  ASSESSMENT:    1. NICM (nonischemic cardiomyopathy) (HCC)   2. Primary hypertension    PLAN:    In order of problems listed above:    Nonischemic cardiomyopathy, initial EF 35%.  Last echo 08/2023 EF 45 to 50%.  Cont Toprol -XL 25 mg daily, Entresto  24-26 mg twice daily.  Low normal BP preventing up titration of GDMT.  Patient developed lightheadedness on Entresto  49/51.   Hx of hypertension, BP controlled.  Continue Toprol -XL and Entresto .  Follow-up in 12 months   This note was generated in part or whole with voice recognition software. Voice recognition is usually quite accurate but there are transcription errors that can and very often do occur. I apologize for any typographical errors that were not detected and corrected.  Medication Adjustments/Labs and Tests Ordered: Current medicines are reviewed at length with the patient today.  Concerns regarding medicines are outlined above.  Orders Placed This Encounter  Procedures   EKG 12-Lead     No orders of the defined types were placed in this encounter.    Patient Instructions  Medication Instructions:  Your Physician recommend you continue on your current medication as directed.    *If you need a refill on your cardiac medications before your next appointment, please call your pharmacy*  Lab Work: No labs ordered today  If you have labs (blood work) drawn today and your tests are completely normal, you will receive your results only by: MyChart Message (if you have MyChart) OR A paper  copy in the mail If you have any lab test that is abnormal or we need to change your treatment, we will call you to review the results.  Testing/Procedures: No test ordered today   Follow-Up: At [facility], you and your health needs are our priority.  As part of our continuing mission to provide you with exceptional heart care, our providers are all part of one team.  This team includes your primary Cardiologist (physician) and Advanced Practice Providers or APPs (Physician Assistants and Nurse Practitioners) who all work together to provide you with the care you need, when you need it.  Your next appointment:   1 year(s)  Provider:   You may see Leslie Delton, MD or one of the following Advanced Practice Providers on your designated Care Team:   Laneta Pintos, NP Gildardo Labrador, PA-C Varney Gentleman, PA-C Cadence Carlock, PA-C Ronald Cockayne, NP Morey Ar, NP    We recommend signing up for the patient portal called "MyChart".  Sign up information is provided on this After Visit Summary.  MyChart is used to connect with patients for Virtual Visits (Telemedicine).  Patients are able to view lab/test results, encounter notes, upcoming appointments, etc.  Non-urgent messages can be sent to your provider as well.   To learn more about what you can do with MyChart, go to [email or web address].     Signed, Leslie Delton, MD  10/25/2023 10:17 AM    [facility]

## 2023-11-11 NOTE — Progress Notes (Addendum)
 Office Visit Note  Patient: Leslie Esparza             Date of Birth: 09-17-1970           MRN: [id number]             PCP: Aloha Arnold, PA-C Referring: Aloha Arnold, PA-C Visit Date: 11/25/2023 Occupation: @GUAROCC @  Subjective:  Pain in multiple joints  History of Present Illness: Leslie Esparza is a 53 y.o. female returns today after her last visit in December 2024.  She has known history of osteoarthritis.  She states after the bilateral [facility] surgery the hand pain has improved.  She is not experiencing any carpal tunnel syndrome symptoms.  She has not experienced any numbness in her hands recently.  Has been experiencing some discomfort in the left lower back which catches after prolonged sitting. She states the pain sometimes radiates down into her left leg.  Is been walking on a regular basis.  She states she has been experiencing some discomfort in the right heel.  She had physical therapy for about a month.  She states that helped to some extent but the pain did not completely go away.  He continues to have some muscle pain as well.    Activities of Daily Living:  Patient reports morning stiffness for 10  minutes.   Patient Reports nocturnal pain.  Difficulty dressing/grooming: Denies Difficulty climbing stairs: Denies Difficulty getting out of chair: Reports Difficulty using hands for taps, buttons, cutlery, and/or writing: Denies  Review of Systems  Constitutional:  Negative for fatigue.  HENT:  Negative for mouth sores and mouth dryness.   Eyes:  Negative for dryness.  Respiratory:  Negative for shortness of breath.   Cardiovascular:  Negative for chest pain and palpitations.  Gastrointestinal:  Negative for blood in stool, constipation and diarrhea.  Endocrine: Negative for increased urination.  Genitourinary:  Negative for difficulty urinating and hematuria.  Musculoskeletal:  Positive for joint pain, joint pain and morning stiffness. Negative for gait  problem, joint swelling, myalgias, muscle weakness and myalgias.  Skin:  Negative for color change, rash, hair loss and sensitivity to sunlight.  Allergic/Immunologic: Negative for susceptible to infections.  Neurological:  Negative for fainting and headaches.  Hematological:  Negative for swollen glands.  Psychiatric/Behavioral:  Negative for depressed mood and sleep disturbance. The patient is not nervous/anxious.     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   Chronic pain syndrome 08/11/2020   Lumbar radiculopathy 08/11/2020   Essential hypertension 04/23/2020   Depression 04/23/2020   Hx of migraine headaches 04/23/2020   Congestive heart failure (HCC) 03/22/2020   HFrEF (heart failure with reduced ejection fraction) (HCC)    Sacroiliac joint pain 03/28/2019   Pain in pelvis 01/11/2019   CAP (community acquired pneumonia) 04/25/2014   Cough 04/24/2014    Past Medical History:  Diagnosis Date   Allergic rhinitis    Bilateral carpal tunnel syndrome    CHF (congestive heart failure) (HCC)  ef 45 to 50 % per last echo 03-03-2021   Common migraine    Complication of anesthesia    itching after c section   History of COVID-19 11/04/2020   x 3 days all symptoms resolved   Hypertension    Multiple thyroid  nodules    monitored by pcp nathan conroy pa [facility], thryoid cysts also   Nonischemic cardiomyopathy (HCC)    OCD (obsessive compulsive disorder)     Osteoarthritis of thumbs    Pelvic pain    Recurrent cold sores     Family History  Problem Relation Age of Onset   Migraines Mother    Thyroid  disease Father    Heart disease Father    Fibromyalgia Father    Migraines Brother    Heart disease Paternal Grandfather    Healthy Son    Healthy Daughter    Stroke Neg Hx    Transient ischemic attack Neg Hx    Past Surgical History:  Procedure Laterality Date   CESAREAN SECTION     x2 2000 and 2004   CYSTOSCOPY N/A 06/12/2021   Procedure: CYSTOSCOPY;  Surgeon: Vernal Gold, MD;  Location: [facility];  Service: Gynecology;  Laterality: N/A;   LAPAROSCOPIC HYSTERECTOMY N/A 06/12/2021   Procedure: HYSTERECTOMY TOTAL LAPAROSCOPIC;  Surgeon: Vernal Gold, MD;  Location: [facility];  Service: Gynecology;  Laterality: N/A;   LEFT HEART CATH AND CORONARY ANGIOGRAPHY Left 02/11/2020   Procedure: LEFT HEART CATH AND CORONARY ANGIOGRAPHY;  Surgeon: Wenona Hamilton, MD;  Location: [facility];  Service: Cardiovascular;  Laterality: Left;   WISDOM TOOTH EXTRACTION     yrs ago per pt on 06-03-2021   WRIST ARTHROSCOPY WITH CARPOMETACARPEL [facility]) ARTHROPLASTY Bilateral    2024, 2023   Social History   Social History Narrative   Lives at home with husband and children   Right handed   Caffeine: none currently   Immunization History  Administered Date(s) Administered   Influenza Split 03/14/2014     Objective: Vital Signs: BP 107/69 (BP Location: Left Arm, Patient Position: Sitting, Cuff Size: Normal)   Pulse 75   Resp 16   Ht 5' 1.5 (1.562 m)   Wt 156 lb 12.8 oz (71.1 kg)   LMP 06/06/2021 (Approximate)   BMI 29.15 kg/m    Physical Exam Vitals and nursing note reviewed.  Constitutional:      Appearance: She is well-developed.  HENT:     Head: Normocephalic and atraumatic.   Eyes:     Conjunctiva/sclera: Conjunctivae normal.    Cardiovascular:     Rate and Rhythm: Normal rate and regular  rhythm.     Heart sounds: Normal heart sounds.  Pulmonary:     Effort: Pulmonary effort is normal.     Breath sounds: Normal breath sounds.  Abdominal:     General: Bowel sounds are normal.     Palpations: Abdomen is soft.   Musculoskeletal:     Cervical back: Normal range of motion.  Lymphadenopathy:     Cervical: No cervical adenopathy.   Skin:    General: Skin is warm and dry.     Capillary Refill: Capillary refill takes less than 2 seconds.   Neurological:     Mental Status: She is alert and oriented to person, place, and time.   Psychiatric:        Behavior: Behavior normal.      Musculoskeletal Exam: Cervical, thoracic and lumbar spine were in good range  of motion.  She had tenderness over left SI joint.  Shoulders, elbows, wrists, MCPs PIPs and DIPs were in good range of motion.  She has surgical scar over bilateral CMC joints.  No synovitis was noted.  Hip joints and knee joints in good range of motion without any warmth swelling or effusion.  There was no tenderness over ankles or MTPs.  She had mild tenderness over her right heel.  CDAI Exam: CDAI Score: -- Patient Global: --; Provider Global: -- Swollen: --; Tender: -- Joint Exam 11/25/2023   No joint exam has been documented for this visit   There is currently no information documented on the homunculus. Go to the Rheumatology activity and complete the homunculus joint exam.  Investigation: No additional findings.  Imaging: No results found.  Recent Labs: Lab Results  Component Value Date   WBC 6.2 04/22/2023   HGB 13.0 04/22/2023   PLT 373 04/22/2023   NA 139 04/22/2023   K 4.3 04/22/2023   CL 104 04/22/2023   CO2 27 04/22/2023   GLUCOSE 81 04/22/2023   BUN 16 04/22/2023   CREATININE 0.57 04/22/2023   BILITOT 0.4 04/22/2023   ALKPHOS 60 07/10/2021   AST 18 04/22/2023   ALT 17 04/22/2023   PROT 7.0 04/22/2023   PROT 6.9 04/22/2023   ALBUMIN 3.5 07/10/2021   CALCIUM  9.9 04/22/2023   GFRAA  117 03/06/2020    Speciality Comments: No specialty comments available.  Procedures:  Sacroiliac Joint Inj on 11/25/2023 10:09 AM Indications: pain Details: 27 G 1.5 in needle, posterior approach Medications: 1 mL lidocaine  1 %; 40 mg triamcinolone acetonide 40 MG/ML Aspirate: 0 mL Outcome: tolerated well, no immediate complications  Risk of infection, tendon injury, nerve injury, dermal atrophy and hypopigmentation were discussed. Procedure, treatment alternatives, risks and benefits explained, specific risks discussed. Consent was given by the patient. Immediately prior to procedure a time out was called to verify the correct patient, procedure, equipment, support staff and site/side marked as required. Patient was prepped and draped in the usual sterile fashion.     Allergies: Bactrim [sulfamethoxazole-trimethoprim] and Penicillins   Assessment / Plan:     Visit Diagnoses: Polyarthralgia - All autoimmune labs negative except for ANA low titer positive which is not significant.  Patient has no clinical features of autoimmune disease on examination today.  She denies history of sicca symptoms, oral ulcers, nasal ulcers, malar rash, photosensitivity, Raynaud's, inflammatory arthritis or lymphadenopathy.  No further workup is needed.  Primary osteoarthritis of both hands -she continues to have some stiffness in her hands.  Although the pain has improved since she had bilateral CMC surgery.    Ulnar nerve entrapment at elbow, bilateral-resolved.  Bilateral carpal tunnel syndrome -doing well without any recurrence of carpal tunnel syndrome symptoms.  Left carpal tunnel release and left [facility] surgery July 2023, right carpal tunnel release and right [facility] surgery August 2024 by Dr. Delmar Ferrara at [facility]  Chronic pain of both hips -she continues to have some discomfort in her hips.  She good range of motion of bilateral hip joints.  Previous x-rays obtained of bilateral hips were  unremarkable.  Pain in both feet-she has intermittent discomfort in her feet.  She describes discomfort over the right heel.  She had mild tenderness over the right heel.  A handout on plantar fasciitis exercises was given.  Chronic left SI joint pain-she has been having increased pain and discomfort over the left SI joint.  Previous x-rays were reviewed.  She  states it has been catching when she sits after prolonged time.  She requested a left SI joint injection.  Side effects were discussed.  Patient wanted to proceed with the injection.  After informed consent was obtained the left SI joint was injected with lidocaine  and Kenalog as described above.  Patient tolerated the procedure well.  Postprocedure instructions were given.  Lumbar spondylosis - Lower back pain for many years.  Mild spondylosis and facet joint arthropathy was noted on the x-rays obtained.  She states physical therapy was helpful.  She still continues to have some discomfort.  Need for doing regular exercise was emphasized.  Previous x-rays of lumbar spine were reviewed with the patient.  Myalgia-she does not have much muscular discomfort.  No muscular weakness was noted.  Other medical problems are listed as follows:  Other fatigue - History of chronic fatigue.  Chronic pain syndrome   NICM (nonischemic cardiomyopathy) (HCC)  HFrEF (heart failure with reduced ejection fraction) (HCC)  Essential hypertension  Mixed hyperlipidemia  History of OCD (obsessive compulsive disorder)  Thyroid  nodule  Panic disorder  S/P total hysterectomy  Orders: No orders of the defined types were placed in this encounter.  No orders of the defined types were placed in this encounter.    Follow-Up Instructions: Return in about 6 months (around 05/26/2024) for Osteoarthritis.   Nicholas Bari, MD  Note - This record has been created using Animal nutritionist.  Chart creation errors have been sought, but may not always  have  been located. Such creation errors do not reflect on  the standard of medical care.

## 2023-11-25 ENCOUNTER — Ambulatory Visit: Payer: 59 | Attending: Rheumatology | Admitting: Rheumatology

## 2023-11-25 ENCOUNTER — Encounter: Payer: Self-pay | Admitting: Rheumatology

## 2023-11-25 VITALS — BP 107/69 | HR 75 | Resp 16 | Ht 61.5 in | Wt 156.8 lb

## 2023-11-25 DIAGNOSIS — G8929 Other chronic pain: Secondary | ICD-10-CM | POA: Diagnosis not present

## 2023-11-25 DIAGNOSIS — E782 Mixed hyperlipidemia: Secondary | ICD-10-CM

## 2023-11-25 DIAGNOSIS — M25552 Pain in left hip: Secondary | ICD-10-CM

## 2023-11-25 DIAGNOSIS — M19042 Primary osteoarthritis, left hand: Secondary | ICD-10-CM

## 2023-11-25 DIAGNOSIS — G5620 Lesion of ulnar nerve, unspecified upper limb: Secondary | ICD-10-CM

## 2023-11-25 DIAGNOSIS — G894 Chronic pain syndrome: Secondary | ICD-10-CM

## 2023-11-25 DIAGNOSIS — M19041 Primary osteoarthritis, right hand: Secondary | ICD-10-CM | POA: Diagnosis not present

## 2023-11-25 DIAGNOSIS — M79672 Pain in left foot: Secondary | ICD-10-CM

## 2023-11-25 DIAGNOSIS — G5603 Carpal tunnel syndrome, bilateral upper limbs: Secondary | ICD-10-CM

## 2023-11-25 DIAGNOSIS — Z8659 Personal history of other mental and behavioral disorders: Secondary | ICD-10-CM

## 2023-11-25 DIAGNOSIS — E041 Nontoxic single thyroid nodule: Secondary | ICD-10-CM

## 2023-11-25 DIAGNOSIS — M79671 Pain in right foot: Secondary | ICD-10-CM

## 2023-11-25 DIAGNOSIS — I428 Other cardiomyopathies: Secondary | ICD-10-CM

## 2023-11-25 DIAGNOSIS — F410 Panic disorder [episodic paroxysmal anxiety] without agoraphobia: Secondary | ICD-10-CM

## 2023-11-25 DIAGNOSIS — M2550 Pain in unspecified joint: Secondary | ICD-10-CM

## 2023-11-25 DIAGNOSIS — M25551 Pain in right hip: Secondary | ICD-10-CM

## 2023-11-25 DIAGNOSIS — M533 Sacrococcygeal disorders, not elsewhere classified: Secondary | ICD-10-CM

## 2023-11-25 DIAGNOSIS — I10 Essential (primary) hypertension: Secondary | ICD-10-CM

## 2023-11-25 DIAGNOSIS — M791 Myalgia, unspecified site: Secondary | ICD-10-CM

## 2023-11-25 DIAGNOSIS — Z90710 Acquired absence of both cervix and uterus: Secondary | ICD-10-CM

## 2023-11-25 DIAGNOSIS — M47816 Spondylosis without myelopathy or radiculopathy, lumbar region: Secondary | ICD-10-CM

## 2023-11-25 DIAGNOSIS — R5383 Other fatigue: Secondary | ICD-10-CM

## 2023-11-25 DIAGNOSIS — I5020 Unspecified systolic (congestive) heart failure: Secondary | ICD-10-CM

## 2023-11-25 MED ORDER — LIDOCAINE HCL 1 % IJ SOLN
1.0000 mL | INTRAMUSCULAR | Status: AC | PRN
  Administered 2023-11-25: 1 mL

## 2023-11-25 MED ORDER — TRIAMCINOLONE ACETONIDE 40 MG/ML IJ SUSP
40.0000 mg | INTRAMUSCULAR | Status: AC | PRN
  Administered 2023-11-25: 40 mg via INTRA_ARTICULAR

## 2023-11-25 NOTE — Patient Instructions (Addendum)
 Exercises for Plantar Fasciitis Foot and leg exercises can help if you have plantar fasciitis. Only do the exercises you were told to do. Make sure you know how to do the exercises safely. Follow the steps below. It's normal to feel mild discomfort. Stop if you feel pain or your pain gets worse. Do not start these exercises until told by your health care provider. Stretching and range-of-motion exercises These exercises warm up your muscles and joints. They also help with movement and flexibility of your foot. They can help with pain. Plantar fascia stretch This exercise will stretch your plantar fascia, which is a band of thick tissue on the bottom of your foot. Sit with your left / right leg crossed over your other knee. Hold your heel with one hand with that thumb near your arch. With your other hand, hold your toes. Gently pull your toes back toward the top of your foot. You should feel a stretch on the bottom of your toes, on the bottom of your foot, or both. Hold this stretch for __________ seconds. Slowly let go of your toes. Go back to the starting position. Repeat __________ times. Do this exercise __________ times a day. Gastroc stretch, standing This exercise is called an upper calf, or gastroc, stretch. It stretches the muscles in the back of your upper calf. Stand with your hands against a wall. Extend your left / right leg behind you. Bend your front knee just a little. Keep your heels on the floor, your toes facing forward, and your back knee straight. Shift your weight toward the wall. Do not arch your back. You should feel a gentle stretch in your upper calf. Hold this position for __________ seconds. Repeat __________ times. Do this exercise __________ times a day. Soleus stretch, standing This exercise is called a lower calf, or soleus, stretch. It stretches the muscles in the back of your lower calf. Stand with your hands against a wall. Extend your left / right leg behind  you, and bend your front knee slightly. Keep your heels on the floor and your toes facing forward. Bend your back knee and shift your weight slightly over your back leg. You should feel a gentle stretch deep in your lower calf. Hold this position for __________ seconds. Repeat __________ times. Do this exercise __________ times a day. Gastroc and soleus stretch, standing step This exercise stretches the muscles in the back of your lower leg. This includes your gastroc and soleus muscles. Stand with the ball of your left / right foot on the front of a step. The ball of your foot is on the walking surface, right under your toes. Keep your other foot firmly on the same step. Hold on to the wall or a railing for balance. Slowly lift your other foot, letting your body weight press your heel down over the edge of the front of the step. Keep your knee straight and unbent. You should feel a stretch in your calf. Hold this position for __________ seconds. Return both feet to the step. Repeat this exercise with a slight bend in your left / right knee. Repeat __________ times with your left / right knee straight and __________ times with your left / right knee bent. Do this exercise __________ times a day. Balance exercise This exercise builds your balance and strength control of your arch. It helps take pressure off your plantar fascia. Single leg stand If this exercise is too easy, you can try it with your eyes closed  or while standing on a pillow. Without shoes, stand near a railing or in a doorway. You may hold on to the railing or doorway as needed. Stand on your left / right foot. Keep your big toe down on the floor. Lift the arch of your foot. You should feel a stretch across the bottom of your foot and arch. Do not let your foot roll inward. Hold this position for __________ seconds. Repeat __________ times. Do this exercise __________ times a day. This information is not intended to replace  advice given to you by your health care provider. Make sure you discuss any questions you have with your health care provider. Document Revised: 11/01/2022 Document Reviewed: 11/01/2022 Elsevier Patient Education  2024 [employer]  Low Back Sprain or Strain Rehab Ask your health care provider which exercises are safe for you. Do exercises exactly as told by your health care provider and adjust them as directed. It is normal to feel mild stretching, pulling, tightness, or discomfort as you do these exercises. Stop right away if you feel sudden pain or your pain gets worse. Do not begin these exercises until told by your health care provider. Stretching and range-of-motion exercises These exercises warm up your muscles and joints and improve the movement and flexibility of your back. These exercises also help to relieve pain, numbness, and tingling. Lumbar rotation  Lie on your back on a firm bed or the floor with your knees bent. Straighten your arms out to your sides so each arm forms a 90-degree angle (right angle) with a side of your body. Slowly move (rotate) both of your knees to one side of your body until you feel a stretch in your lower back (lumbar). Try not to let your shoulders lift off the floor. Hold this position for __________ seconds. Tense your abdominal muscles and slowly move your knees back to the starting position. Repeat this exercise on the other side of your body. Repeat __________ times. Complete this exercise __________ times a day. Single knee to chest  Lie on your back on a firm bed or the floor with both legs straight. Bend one of your knees. Use your hands to move your knee up toward your chest until you feel a gentle stretch in your lower back and buttock. Hold your leg in this position by holding on to the front of your knee. Keep your other leg as straight as possible. Hold this position for __________ seconds. Slowly return to the starting position. Repeat  with your other leg. Repeat __________ times. Complete this exercise __________ times a day. Prone extension on elbows  Lie on your abdomen on a firm bed or the floor (prone position). Prop yourself up on your elbows. Use your arms to help lift your chest up until you feel a gentle stretch in your abdomen and your lower back. This will place some of your body weight on your elbows. If this is uncomfortable, try stacking pillows under your chest. Your hips should stay down, against the surface that you are lying on. Keep your hip and back muscles relaxed. Hold this position for __________ seconds. Slowly relax your upper body and return to the starting position. Repeat __________ times. Complete this exercise __________ times a day. Strengthening exercises These exercises build strength and endurance in your back. Endurance is the ability to use your muscles for a long time, even after they get tired. Pelvic tilt This exercise strengthens the muscles that lie deep in the abdomen. Louis Row  on your back on a firm bed or the floor with your legs extended. Bend your knees so they are pointing toward the ceiling and your feet are flat on the floor. Tighten your lower abdominal muscles to press your lower back against the floor. This motion will tilt your pelvis so your tailbone points up toward the ceiling instead of pointing to your feet or the floor. To help with this exercise, you may place a small towel under your lower back and try to push your back into the towel. Hold this position for __________ seconds. Let your muscles relax completely before you repeat this exercise. Repeat __________ times. Complete this exercise __________ times a day. Alternating arm and leg raises  Get on your hands and knees on a firm surface. If you are on a hard floor, you may want to use padding, such as an exercise mat, to cushion your knees. Line up your arms and legs. Your hands should be directly below your  shoulders, and your knees should be directly below your hips. Lift your left leg behind you. At the same time, raise your right arm and straighten it in front of you. Do not lift your leg higher than your hip. Do not lift your arm higher than your shoulder. Keep your abdominal and back muscles tight. Keep your hips facing the ground. Do not arch your back. Keep your balance carefully, and do not hold your breath. Hold this position for __________ seconds. Slowly return to the starting position. Repeat with your right leg and your left arm. Repeat __________ times. Complete this exercise __________ times a day. Abdominal set with straight leg raise  Lie on your back on a firm bed or the floor. Bend one of your knees and keep your other leg straight. Tense your abdominal muscles and lift your straight leg up, 4-6 inches (10-15 cm) off the ground. Keep your abdominal muscles tight and hold this position for __________ seconds. Do not hold your breath. Do not arch your back. Keep it flat against the ground. Keep your abdominal muscles tense as you slowly lower your leg back to the starting position. Repeat with your other leg. Repeat __________ times. Complete this exercise __________ times a day. Single leg lower with bent knees Lie on your back on a firm bed or the floor. Tense your abdominal muscles and lift your feet off the floor, one foot at a time, so your knees and hips are bent in 90-degree angles (right angles). Your knees should be over your hips and your lower legs should be parallel to the floor. Keeping your abdominal muscles tense and your knee bent, slowly lower one of your legs so your toe touches the ground. Lift your leg back up to return to the starting position. Do not hold your breath. Do not let your back arch. Keep your back flat against the ground. Repeat with your other leg. Repeat __________ times. Complete this exercise __________ times a day. Posture and body  mechanics Good posture and healthy body mechanics can help to relieve stress in your body's tissues and joints. Body mechanics refers to the movements and positions of your body while you do your daily activities. Posture is part of body mechanics. Good posture means: Your spine is in its natural S-curve position (neutral). Your shoulders are pulled back slightly. Your head is not tipped forward (neutral). Follow these guidelines to improve your posture and body mechanics in your everyday activities. Standing  When standing, keep your  spine neutral and your feet about hip-width apart. Keep a slight bend in your knees. Your ears, shoulders, and hips should line up. When you do a task in which you stand in one place for a long time, place one foot up on a stable object that is 2-4 inches (5-10 cm) high, such as a footstool. This helps keep your spine neutral. Sitting  When sitting, keep your spine neutral and keep your feet flat on the floor. Use a footrest, if necessary, and keep your thighs parallel to the floor. Avoid rounding your shoulders, and avoid tilting your head forward. When working at a desk or a computer, keep your desk at a height where your hands are slightly lower than your elbows. Slide your chair under your desk so you are close enough to maintain good posture. When working at a computer, place your monitor at a height where you are looking straight ahead and you do not have to tilt your head forward or downward to look at the screen. Resting When lying down and resting, avoid positions that are most painful for you. If you have pain with activities such as sitting, bending, stooping, or squatting, lie in a position in which your body does not bend very much. For example, avoid curling up on your side with your arms and knees near your chest (fetal position). If you have pain with activities such as standing for a long time or reaching with your arms, lie with your spine in a  neutral position and bend your knees slightly. Try the following positions: Lying on your side with a pillow between your knees. Lying on your back with a pillow under your knees. Lifting  When lifting objects, keep your feet at least shoulder-width apart and tighten your abdominal muscles. Bend your knees and hips and keep your spine neutral. It is important to lift using the strength of your legs, not your back. Do not lock your knees straight out. Always ask for help to lift heavy or awkward objects. This information is not intended to replace advice given to you by your health care provider. Make sure you discuss any questions you have with your health care provider. Document Revised: 10/04/2022 Document Reviewed: 08/18/2020 Elsevier Patient Education  2024 [employer].

## 2024-03-19 ENCOUNTER — Other Ambulatory Visit: Payer: Self-pay | Admitting: Cardiology

## 2024-03-26 ENCOUNTER — Telehealth: Payer: Self-pay | Admitting: Cardiology

## 2024-03-26 NOTE — Telephone Encounter (Signed)
 Called and spoke with the patient.  Patient complains of transient lightheadedness and palpitations.  Patients states feeling this for a couple of weeks, when she was on vacation.  Patient did not have any blood pressure or heart rate numbers to report from that time period, but did report blood pressure of 111/76 with heart rate of 77 taken today at 1630.  Patient affirms drinking plenty of fluid and the symptoms associated with activity.  Patient denies any chest pain or shortness of breath.  This RN suggested the best course of action to be schedule a visit to be seen in the office by Dr. Darliss or an APP shortly.  Patient agreeable with this plan.  Patient advised that scheduling will call to get her scheduled tomorrow (03/27/24).  Patient advised of going to the emergency room, or calling 911 for any symptoms of chest pain, shortness of breath, or dizziness/lightheadedness that feels like she is going to pass out.  Patient verbalized understanding with all questions and concerns addressed at this time.

## 2024-03-26 NOTE — Telephone Encounter (Signed)
 Patient c/o Palpitations:  STAT if patient reporting lightheadedness, shortness of breath, or chest pain  How long have you had palpitations/irregular HR/ Afib? Are you having the symptoms now? Palpitations  Are you currently experiencing lightheadedness, SOB or CP? No   Do you have a history of afib (atrial fibrillation) or irregular heart rhythm? No   Have you checked your BP or HR? (document readings if available): has not taken   Are you experiencing any other symptoms? Swelling in legs, lightheaded at times

## 2024-03-27 ENCOUNTER — Ambulatory Visit

## 2024-03-27 ENCOUNTER — Ambulatory Visit: Attending: Medical | Admitting: Medical

## 2024-03-27 ENCOUNTER — Encounter: Payer: Self-pay | Admitting: Medical

## 2024-03-27 VITALS — BP 112/64 | HR 72 | Ht 61.5 in | Wt 160.2 lb

## 2024-03-27 DIAGNOSIS — R079 Chest pain, unspecified: Secondary | ICD-10-CM

## 2024-03-27 DIAGNOSIS — R002 Palpitations: Secondary | ICD-10-CM

## 2024-03-27 DIAGNOSIS — I10 Essential (primary) hypertension: Secondary | ICD-10-CM | POA: Diagnosis not present

## 2024-03-27 DIAGNOSIS — I447 Left bundle-branch block, unspecified: Secondary | ICD-10-CM | POA: Diagnosis not present

## 2024-03-27 DIAGNOSIS — R42 Dizziness and giddiness: Secondary | ICD-10-CM | POA: Diagnosis not present

## 2024-03-27 DIAGNOSIS — I5020 Unspecified systolic (congestive) heart failure: Secondary | ICD-10-CM

## 2024-03-27 NOTE — Progress Notes (Signed)
 Cardiology Office Note   Date:  03/27/2024  ID:  Leslie Esparza, DOB Feb 02, 1971, MRN [id number] PCP: Montey Lot, PA-C  [facility] Providers Cardiologist:  Redell Cave, MD   History of Present Illness Leslie Esparza is a 53 y.o. female with h/o NICM, HFmrEF, HTN, HLD, who presents for follow-up of palpitations, lightheadedness, mild lower leg edema.   Echo in 2022 showed LVEF 30-35%. cMRI showed EF 57%, no evidence of scar. Echo 02/2021 showed LVEF 45-50%. Echo 08/2023 showed LVEF 45-50%.  Patient was last seen 10/25/2023 and was stable from a cardiac perspective.  Patient developed lightheadedness on Entresto  49-25 mg twice daily.  Today, the patient reports mild lower leg edema, lightheadedness, and palpitations. Palpitations started last week and she feels they are getting worse. She feels heart skipping a beat, heart rate has been in normal range.  She has not noted precipitating factors. She reports rare chest pain that is atypical, not worse with exertion. Lower leg edema happened a week ago and it was at the end of the day. She has lightheadedness when she is doing stuff. She has been able to maintain normal activity. No changes in her life. Diet has been better. Drinking water and no caffeine.  Orthostatics are negative.  Studies Reviewed EKG Interpretation Date/Time:  Tuesday March 27 2024 14:41:23 EDT Ventricular Rate:  72 PR Interval:  170 QRS Duration:  122 QT Interval:  426 QTC Calculation: 466 R Axis:   -33  Text Interpretation: Normal sinus rhythm Left axis deviation Minimal voltage criteria for LVH, may be normal variant ( Cornell product ) Septal infarct (cited on or before 25-Oct-2023) Possible Lateral infarct (cited on or before 25-Oct-2023) When compared with ECG of 25-Oct-2023 09:06, No significant change was found Confirmed by Franchester, Espn Zeman ([id number]) on 03/27/2024 2:50:55 PM    Echo 08/2023  1. Left ventricular ejection fraction, by  estimation, is 45 to 50%. Left  ventricular ejection fraction by PLAX is 45 %. The left ventricle has  mildly decreased function. The left ventricle demonstrates global  hypokinesis. Left ventricular diastolic  parameters are consistent with Grade I diastolic dysfunction (impaired  relaxation).   2. Right ventricular systolic function is normal. The right ventricular  size is normal. Tricuspid regurgitation signal is inadequate for assessing  PA pressure.   3. The mitral valve is normal in structure. Mild mitral valve  regurgitation. No evidence of mitral stenosis.   4. The aortic valve is tricuspid. Aortic valve regurgitation is not  visualized. No aortic stenosis is present.   5. The inferior vena cava is normal in size with greater than 50%  respiratory variability, suggesting right atrial pressure of 3 mmHg.   Echo 02/2022  1. Left ventricular ejection fraction, by estimation, is 45 to 50%. The  left ventricle has mildly decreased function. The left ventricle  demonstrates global hypokinesis. Left ventricular diastolic parameters are  consistent with Grade I diastolic  dysfunction (impaired relaxation). The average left ventricular global  longitudinal strain is -17.0 %.   2. Right ventricular systolic function is normal. The right ventricular  size is normal.   3. The mitral valve is normal in structure. No evidence of mitral valve  regurgitation. No evidence of mitral stenosis.   4. The aortic valve is normal in structure. Aortic valve regurgitation is  not visualized. No aortic stenosis is present.   5. The inferior vena cava is normal in size with greater than 50%  respiratory variability, suggesting right atrial pressure  of 3 mmHg.   Echo 02/2021  1. Left ventricular ejection fraction, by estimation, is 45 to 50%. The  left ventricle has mildly decreased function. The left ventricle  demonstrates global hypokinesis. Left ventricular diastolic parameters are  consistent with  Grade I diastolic  dysfunction (impaired relaxation).   2. Right ventricular systolic function is normal. The right ventricular  size is normal. Tricuspid regurgitation signal is inadequate for assessing  PA pressure.   3. The mitral valve is normal in structure. Trivial mitral valve  regurgitation. No evidence of mitral stenosis.   4. The aortic valve is tricuspid. Aortic valve regurgitation is not  visualized. No aortic stenosis is present.   5. The inferior vena cava is normal in size with greater than 50%  respiratory variability, suggesting right atrial pressure of 3 mmHg.   Echo 09/2020 1. Left ventricular ejection fraction, by estimation, is 30 to 35%. The  left ventricle has moderately decreased function. The left ventricle  demonstrates global hypokinesis. Left ventricular diastolic parameters are  consistent with Grade I diastolic  dysfunction (impaired relaxation). The average left ventricular global  longitudinal strain is -17.2 %.   2. Right ventricular systolic function is normal. The right ventricular  size is normal.   3. The mitral valve is normal in structure. Mild mitral valve  regurgitation. No evidence of mitral stenosis.   Heart monitor 05/2020 Normal sinus rhythm avg HR of 63 bpm.    5 patient triggered events noted, 1 associated with short run of SVT, other triggers not associated with significant arrhythmia.   1 run of Supraventricular Tachycardia/atrial tachycardia occurred lasting 14 beats with a max rate of 144 bpm (avg 125 bpm).  Junctional Rhythm was present.    Isolated SVEs were rare (<1.0%), SVE Couplets were rare (<1.0%), and SVE Triplets were rare (<1.0%). Isolated VEs were rare (<1.0%), VE Couplets were rare (<1.0%), and no VE Triplets were present.  Echo 04/2020 1. Left ventricular ejection fraction, by estimation, is 35 to 40%. The  left ventricle has moderately decreased function. The left ventricle  demonstrates global hypokinesis. Left  ventricular diastolic parameters are  consistent with Grade I diastolic  dysfunction (impaired relaxation).   2. Right ventricular systolic function is normal. The right ventricular  size is normal. There is normal pulmonary artery systolic pressure. The  estimated right ventricular systolic pressure is 29.6 mmHg.   3. The mitral valve is normal in structure. Mild mitral valve  regurgitation.   cMRI 03/2020 IMPRESSION: 1.  Normal Left ventricular and right ventricular function, LVEF 57%   2.  There is no evidence of late gadolinium enhancement or scar   3.  No significant valvular abnormalities   4.  No evidence for infiltrative disease   5.  LVEF is now normalized compared to previous echocardiogram  [facility] 01/2020  The left ventricular ejection fraction is 35-45% by visual estimate. There is mild to moderate left ventricular systolic dysfunction. LV end diastolic pressure is normal.   1.  Normal coronary arteries. 2.  Mildly to moderately reduced LV systolic function with an EF of 40 to 45%. 3.  Normal left ventricular end-diastolic pressure.   Recommendations: The patient has nonischemic cardiomyopathy for which I recommend continuing medical therapy.  Echo 01/2020 . Left ventricular ejection fraction, by estimation, is 35 to 40%. The  left ventricle has moderately decreased function. The left ventricle  demonstrates global hypokinesis. There is moderate left ventricular  hypertrophy. Left ventricular diastolic  parameters are consistent with  Grade I diastolic dysfunction (impaired  relaxation). The average left ventricular global longitudinal strain is  -11.3 %.   2. Right ventricular systolic function is normal. The right ventricular  size is normal. Tricuspid regurgitation signal is inadequate for assessing  PA pressure.   3. Mild mitral valve regurgitation.       Physical Exam VS:  BP 112/64 (BP Location: Left Arm, Patient Position: Sitting, Cuff Size: Normal)    Pulse 72   Ht 5' 1.5 (1.562 m)   Wt 160 lb 4 oz (72.7 kg)   LMP 06/06/2021 (Approximate)   SpO2 98%   BMI 29.79 kg/m   Orthostatic VS for the past 24 hrs (Last 3 readings):  BP- Lying Pulse- Lying BP- Sitting Pulse- Sitting BP- Standing at 0 minutes Pulse- Standing at 0 minutes BP- Standing at 3 minutes Pulse- Standing at 3 minutes  03/27/24 1442 119/76 72 116/72 76 106/68 84 116/73 84      Wt Readings from Last 3 Encounters:  03/27/24 160 lb 4 oz (72.7 kg)  11/25/23 156 lb 12.8 oz (71.1 kg)  10/25/23 157 lb 12.8 oz (71.6 kg)    GEN: Well nourished, well developed in no acute distress NECK: No JVD; No carotid bruits CARDIAC: RRR, no murmurs, rubs, gallops RESPIRATORY:  Clear to auscultation without rales, wheezing or rhonchi  ABDOMEN: Soft, non-tender, non-distended EXTREMITIES:  No edema; No deformity   ASSESSMENT AND PLAN  Palpitations Patient reported palpitations that started last week.  She has not noticed her heart rate being elevated. She feels heart skipping a beat, they are coming and going with no precipitating event.  No recent changes reported.  She reports a healthy diet and no caffeine intake.  She only drinks water.  EKG today shows normal sinus rhythm with a heart of 72 bpm.  EKG strip is unremarkable as well.  She is on Toprol  25 mg daily.  I will check c-Met, CBC, TSH, magnesium.  I will order a 2-week heart monitor.  She will let us  know if she needs to increase Toprol  to 25 mg twice a day.  NICM HFmrEF Patient reports dependent lower leg edema that occurred last week.  This has since resolved.  She reports a low-salt diet.  She has not wanting a diuretic at this time.  We will continue Toprol  25 mg daily and Entresto  24-26 mg twice daily.  Low blood pressure and lightheadedness limiting GDMT.  HTN Patient reports occasional lightheadedness.  Orthostatics are negative.  Continue Toprol  and Entresto .       Dispo: Follow-up in 2 months  Signed, Eural Holzschuh VEAR Fishman, PA-C

## 2024-03-27 NOTE — Patient Instructions (Signed)
 Medication Instructions:  Your physician recommends that you continue on your current medications as directed. Please refer to the Current Medication list given to you today.    *If you need a refill on your cardiac medications before your next appointment, please call your pharmacy*  Lab Work: Your provider would like for you to have following labs drawn today TSH, CMP, CBC, Mg.     Testing/Procedures: ZIO XT- Long Term Monitor Instructions  Your physician has requested you wear a ZIO patch monitor for 14 days.  This is a single patch monitor. [employer] supplies one patch monitor per enrollment. Additional stickers are not available. Please do not apply patch if you will be having a Nuclear Stress Test, Echocardiogram, Cardiac CT, MRI, or Chest Xray during the period you would be wearing the monitor. The patch cannot be worn during these tests. You cannot remove and re-apply the ZIO XT patch monitor.  Your ZIO patch monitor will be mailed 3 day USPS to your address on file. It may take 3-5 days to receive your monitor after you have been enrolled. Once you have received your monitor, please review the enclosed instructions. Your monitor has already been registered assigning a specific monitor serial number to you.  Billing and Patient Assistance Program Information  We have supplied [employer] with any of your insurance information on file for billing purposes.  [employer] offers a sliding scale Patient Assistance Program for patients that do not have insurance, or whose insurance does not completely cover the cost of the ZIO monitor.  You must apply for the Patient Assistance Program to qualify for this discounted rate.  To apply, please call [employer] at [phone], select option 4, select option 2, ask to apply for Patient Assistance Program. Meredeth will ask your household income, and how many people are in your household. They will quote your out-of-pocket cost based on that information. [employer] will  also be able to set up a 53-month, interest-free payment plan if needed.  Applying the monitor   Shave hair from upper left chest.  Hold abrader disc by orange tab. Rub abrader in 40 strokes over the upper left chest as indicated in your monitor instructions.  Clean area with 4 enclosed alcohol pads. Let dry.  Apply patch as indicated in monitor instructions. Patch will be placed under collarbone on left side of chest with arrow pointing upward.  Rub patch adhesive wings for 2 minutes. Remove white label marked 1. Remove the white label marked 2. Rub patch adhesive wings for 2 additional minutes.  While looking in a mirror, press and release button in center of patch. A small green light will flash 3-4 times. This will be your only indicator that the monitor has been turned on.  Do not shower for the first 24 hours. You may shower after the first 24 hours.  Press the button if you feel a symptom. You will hear a small click. Record Date, Time and Symptom in the Patient Logbook.  When you are ready to remove the patch, follow instructions on the last 2 pages of Patient Logbook.  Stick patch monitor into the tabs at the bottom of the return box.  Place Patient Logbook in the blue and white box. Use locking tab on box and tape box closed securely. The blue and white box has prepaid postage on it. Please place it in the mailbox as soon as possible. Your physician should have your test results approximately 7-14 days after the monitor has been mailed  back to [employer].  Call [facility] Customer Care at [phone] if you have questions regarding your ZIO XT patch monitor.  Call them immediately if you see an orange light blinking on your monitor.  If your monitor falls off in less than 4 days, contact our Monitor department at [phone].  If your monitor becomes loose or falls off after 4 days call [employer] at [phone] for suggestions on securing your monitor.   Follow-Up: At  [facility], you and your health needs are our priority.  As part of our continuing mission to provide you with exceptional heart care, our providers are all part of one team.  This team includes your primary Cardiologist (physician) and Advanced Practice Providers or APPs (Physician Assistants and Nurse Practitioners) who all work together to provide you with the care you need, when you need it.  Your next appointment:   2 month(s)  Provider:   Mikey Fishman, PA-C

## 2024-03-28 ENCOUNTER — Ambulatory Visit: Payer: Self-pay | Admitting: Medical

## 2024-03-28 LAB — CBC
Hematocrit: 39.2 % (ref 34.0–46.6)
Hemoglobin: 12.9 g/dL (ref 11.1–15.9)
MCH: 29.5 pg (ref 26.6–33.0)
MCHC: 32.9 g/dL (ref 31.5–35.7)
MCV: 90 fL (ref 79–97)
Platelets: 405 x10E3/uL (ref 150–450)
RBC: 4.38 x10E6/uL (ref 3.77–5.28)
RDW: 12.9 % (ref 11.7–15.4)
WBC: 7.3 x10E3/uL (ref 3.4–10.8)

## 2024-03-28 LAB — COMPREHENSIVE METABOLIC PANEL WITH GFR
ALT: 20 IU/L (ref 0–32)
AST: 17 IU/L (ref 0–40)
Albumin: 4.3 g/dL (ref 3.8–4.9)
Alkaline Phosphatase: 74 IU/L (ref 49–135)
BUN/Creatinine Ratio: 18 (ref 9–23)
BUN: 10 mg/dL (ref 6–24)
Bilirubin Total: 0.3 mg/dL (ref 0.0–1.2)
CO2: 22 mmol/L (ref 20–29)
Calcium: 9.6 mg/dL (ref 8.7–10.2)
Chloride: 104 mmol/L (ref 96–106)
Creatinine, Ser: 0.55 mg/dL — ABNORMAL LOW (ref 0.57–1.00)
Globulin, Total: 2.3 g/dL (ref 1.5–4.5)
Glucose: 87 mg/dL (ref 70–99)
Potassium: 4.1 mmol/L (ref 3.5–5.2)
Sodium: 140 mmol/L (ref 134–144)
Total Protein: 6.6 g/dL (ref 6.0–8.5)
eGFR: 109 mL/min/1.73 (ref >59)

## 2024-03-28 LAB — MAGNESIUM: Magnesium: 2.2 mg/dL (ref 1.6–2.3)

## 2024-03-28 LAB — TSH: TSH: 0.496 u[IU]/mL (ref 0.450–4.500)

## 2024-04-19 DIAGNOSIS — R002 Palpitations: Secondary | ICD-10-CM | POA: Diagnosis not present

## 2024-05-15 NOTE — Progress Notes (Deleted)
 Office Visit Note  Patient: Leslie Esparza             Date of Birth: 1971-03-24           MRN: [id number]             PCP: Montey Lot, PA-C Referring: Montey Lot, PA-C Visit Date: 05/29/2024 Occupation: Data Unavailable  Subjective:  No chief complaint on file.   History of Present Illness: KINSLIE Esparza is a 53 y.o. female ***     Activities of Daily Living:  Patient reports morning stiffness for *** {minute/hour:19697}.   Patient {ACTIONS;DENIES/REPORTS:21021675::Denies} nocturnal pain.  Difficulty dressing/grooming: {ACTIONS;DENIES/REPORTS:21021675::Denies} Difficulty climbing stairs: {ACTIONS;DENIES/REPORTS:21021675::Denies} Difficulty getting out of chair: {ACTIONS;DENIES/REPORTS:21021675::Denies} Difficulty using hands for taps, buttons, cutlery, and/or writing: {ACTIONS;DENIES/REPORTS:21021675::Denies}  No Rheumatology ROS completed.   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   Chronic pain syndrome 08/11/2020   Lumbar radiculopathy 08/11/2020   Essential hypertension 04/23/2020   Depression 04/23/2020   Hx of migraine headaches 04/23/2020   Congestive heart failure (HCC) 03/22/2020   HFrEF (heart failure with reduced ejection fraction) (HCC)    Sacroiliac joint pain 03/28/2019   Pain in pelvis 01/11/2019   CAP (community acquired pneumonia) 04/25/2014   Cough 04/24/2014    Past Medical History:  Diagnosis Date    Allergic rhinitis    Bilateral carpal tunnel syndrome    CHF (congestive heart failure) (HCC)    ef 45 to 50 % per last echo 03-03-2021   Common migraine    Complication of anesthesia    itching after c section   History of COVID-19 11/04/2020   x 3 days all symptoms resolved   Hypertension    Multiple thyroid  nodules    monitored by pcp nathan conroy pa [facility], thryoid cysts also   Nonischemic cardiomyopathy (HCC)    OCD (obsessive compulsive disorder)    Osteoarthritis of thumbs    Pelvic pain    Recurrent cold sores     Family History  Problem Relation Age of Onset   Migraines Mother    Thyroid  disease Father    Heart disease Father    Fibromyalgia Father    Migraines Brother    Heart disease Paternal Grandfather    Healthy Son    Healthy Daughter    Stroke Neg Hx    Transient ischemic attack Neg Hx    Past Surgical History:  Procedure Laterality Date   CESAREAN SECTION     x2 2000 and 2004   CYSTOSCOPY N/A 06/12/2021   Procedure: CYSTOSCOPY;  Surgeon: Gloriann Chick, MD;  Location: [facility];  Service: Gynecology;  Laterality: N/A;   LAPAROSCOPIC HYSTERECTOMY N/A 06/12/2021   Procedure: HYSTERECTOMY TOTAL LAPAROSCOPIC;  Surgeon: Gloriann Chick, MD;  Location: [facility];  Service: Gynecology;  Laterality: N/A;   LEFT HEART CATH AND CORONARY ANGIOGRAPHY Left 02/11/2020   Procedure: LEFT HEART CATH AND CORONARY ANGIOGRAPHY;  Surgeon: Darron Deatrice LABOR, MD;  Location: [facility];  Service: Cardiovascular;  Laterality: Left;   WISDOM TOOTH EXTRACTION     yrs ago per pt on 06-03-2021   WRIST ARTHROSCOPY WITH CARPOMETACARPEL [facility]) ARTHROPLASTY Bilateral    2024, 2023   Social History   Tobacco Use   Smoking status: Never    Passive exposure: Never   Smokeless tobacco: Never  Vaping Use   Vaping status: Never Used  Substance Use Topics   Alcohol use: No    Alcohol/week: 0.0 standard drinks of alcohol   Drug use: No    Social History   Social History Narrative   Lives at home with husband and children   Right handed   Caffeine: none currently     Immunization History  Administered Date(s) Administered   Influenza Split 03/14/2014     Objective: Vital Signs: LMP 06/06/2021 (Approximate)    Physical Exam   Musculoskeletal Exam: ***  CDAI Exam: CDAI Score: -- Patient Global: --; Provider Global: -- Swollen: --; Tender: -- Joint Exam 05/29/2024   No joint exam has been documented for this visit   There is currently no information documented on the homunculus. Go to the Rheumatology activity and complete the homunculus joint exam.  Investigation: No additional findings.  Imaging: LONG TERM MONITOR (3-14 DAYS) Result Date: 04/19/2024 Patch Wear Time:  10 days and 20 hours (2025-10-17T13:54:43-0400 to 2025-10-28T10:07:00-0400) Patient had a min HR of 49 bpm, max HR of 118 bpm, and avg HR of 69 bpm. Predominant underlying rhythm was Sinus Rhythm. Slight P wave morphology changes were noted. Bundle Branch Block/IVCD was present. Isolated SVEs were rare (<1.0%), SVE Couplets were  rare (<1.0%), and SVE Triplets were rare (<1.0%). Isolated VEs were rare (<1.0%, 243), VE Couplets were rare (<1.0%, 1), and VE Triplets were rare (<1.0%, 1). Conclusion Average heart rate 69, range 49-118 bpm. No A-fib or flutter. No sustained arrhythmias. Patient triggered events associated with sinus rhythm, rare PVCs.    Recent Labs: Lab Results  Component Value Date   WBC 7.3 03/27/2024   HGB 12.9 03/27/2024   PLT 405 03/27/2024   NA 140 03/27/2024   K 4.1 03/27/2024   CL 104 03/27/2024   CO2 22 03/27/2024   GLUCOSE 87 03/27/2024   BUN 10 03/27/2024   CREATININE 0.55 (L) 03/27/2024   BILITOT 0.3 03/27/2024   ALKPHOS 74 03/27/2024   AST 17 03/27/2024   ALT 20 03/27/2024   PROT 6.6 03/27/2024   ALBUMIN 4.3 03/27/2024   CALCIUM  9.6 03/27/2024   GFRAA 117 03/06/2020    Speciality Comments: No  specialty comments available.  Procedures:  No procedures performed Allergies: Sulfamethoxazole-trimethoprim, Oxycodone , and Penicillins   Assessment / Plan:     Visit Diagnoses: No diagnosis found.  Orders: No orders of the defined types were placed in this encounter.  No orders of the defined types were placed in this encounter.   Face-to-face time spent with patient was *** minutes. Greater than 50% of time was spent in counseling and coordination of care.  Follow-Up Instructions: No follow-ups on file.   Daved JAYSON Gavel, CMA  Note - This record has been created using Animal nutritionist.  Chart creation errors have been sought, but may not always  have been located. Such creation errors do not reflect on  the standard of medical care.

## 2024-05-28 ENCOUNTER — Telehealth: Payer: Self-pay

## 2024-05-28 ENCOUNTER — Ambulatory Visit: Attending: Medical | Admitting: Medical

## 2024-05-28 ENCOUNTER — Encounter: Payer: Self-pay | Admitting: Medical

## 2024-05-28 VITALS — BP 120/70 | HR 75 | Ht 61.0 in | Wt 160.0 lb

## 2024-05-28 DIAGNOSIS — R002 Palpitations: Secondary | ICD-10-CM

## 2024-05-28 DIAGNOSIS — I428 Other cardiomyopathies: Secondary | ICD-10-CM | POA: Diagnosis not present

## 2024-05-28 DIAGNOSIS — I5020 Unspecified systolic (congestive) heart failure: Secondary | ICD-10-CM

## 2024-05-28 DIAGNOSIS — I10 Essential (primary) hypertension: Secondary | ICD-10-CM

## 2024-05-28 NOTE — Progress Notes (Signed)
 Cardiology Office Note   Date:  05/28/2024  ID:  Leslie Esparza, DOB 10/05/1970, MRN [id number] PCP: Montey Lot, PA-C  [facility] Providers Cardiologist:  Redell Cave, MD   History of Present Illness Leslie Esparza is a 53 y.o. female with h/o NICM, HFmrEF, HTN, HLD, who presents for follow-up of palpitations, lightheadedness, mild lower leg edema.    Echo in 2022 showed LVEF 30-35%. cMRI showed EF 57%, no evidence of scar. Echo 02/2021 showed LVEF 45-50%. Echo 08/2023 showed LVEF 45-50%.  Patient did not tolerate higher doses of Entresto  due to lightheadedness and dizziness.  The patient was last seen 03/27/24 reporting lower leg edema, lightheadedness, palpitations.  Orthostatics were negative.  Heart monitor showed normal sinus rhythm with an average heart rate of 69 bpm, no A-fib or flutter, no sustained arrhythmias.  Patient triggered events associated with sinus rhythm and rare PVCs.  Today, the patient reports she has been feeling better. She denies any fluttering. No chest pain or SOB. No lower leg edema.   Studies Reviewed      Echo 08/2023  1. Left ventricular ejection fraction, by estimation, is 45 to 50%. Left  ventricular ejection fraction by PLAX is 45 %. The left ventricle has  mildly decreased function. The left ventricle demonstrates global  hypokinesis. Left ventricular diastolic  parameters are consistent with Grade I diastolic dysfunction (impaired  relaxation).   2. Right ventricular systolic function is normal. The right ventricular  size is normal. Tricuspid regurgitation signal is inadequate for assessing  PA pressure.   3. The mitral valve is normal in structure. Mild mitral valve  regurgitation. No evidence of mitral stenosis.   4. The aortic valve is tricuspid. Aortic valve regurgitation is not  visualized. No aortic stenosis is present.   5. The inferior vena cava is normal in size with greater than 50%  respiratory  variability, suggesting right atrial pressure of 3 mmHg.    Echo 02/2022  1. Left ventricular ejection fraction, by estimation, is 45 to 50%. The  left ventricle has mildly decreased function. The left ventricle  demonstrates global hypokinesis. Left ventricular diastolic parameters are  consistent with Grade I diastolic  dysfunction (impaired relaxation). The average left ventricular global  longitudinal strain is -17.0 %.   2. Right ventricular systolic function is normal. The right ventricular  size is normal.   3. The mitral valve is normal in structure. No evidence of mitral valve  regurgitation. No evidence of mitral stenosis.   4. The aortic valve is normal in structure. Aortic valve regurgitation is  not visualized. No aortic stenosis is present.   5. The inferior vena cava is normal in size with greater than 50%  respiratory variability, suggesting right atrial pressure of 3 mmHg.    Echo 02/2021  1. Left ventricular ejection fraction, by estimation, is 45 to 50%. The  left ventricle has mildly decreased function. The left ventricle  demonstrates global hypokinesis. Left ventricular diastolic parameters are  consistent with Grade I diastolic  dysfunction (impaired relaxation).   2. Right ventricular systolic function is normal. The right ventricular  size is normal. Tricuspid regurgitation signal is inadequate for assessing  PA pressure.   3. The mitral valve is normal in structure. Trivial mitral valve  regurgitation. No evidence of mitral stenosis.   4. The aortic valve is tricuspid. Aortic valve regurgitation is not  visualized. No aortic stenosis is present.   5. The inferior vena cava is normal in size with  greater than 50%  respiratory variability, suggesting right atrial pressure of 3 mmHg.    Echo 09/2020 1. Left ventricular ejection fraction, by estimation, is 30 to 35%. The  left ventricle has moderately decreased function. The left ventricle  demonstrates global  hypokinesis. Left ventricular diastolic parameters are  consistent with Grade I diastolic  dysfunction (impaired relaxation). The average left ventricular global  longitudinal strain is -17.2 %.   2. Right ventricular systolic function is normal. The right ventricular  size is normal.   3. The mitral valve is normal in structure. Mild mitral valve  regurgitation. No evidence of mitral stenosis.    Heart monitor 05/2020 Normal sinus rhythm avg HR of 63 bpm.    5 patient triggered events noted, 1 associated with short run of SVT, other triggers not associated with significant arrhythmia.   1 run of Supraventricular Tachycardia/atrial tachycardia occurred lasting 14 beats with a max rate of 144 bpm (avg 125 bpm).  Junctional Rhythm was present.    Isolated SVEs were rare (<1.0%), SVE Couplets were rare (<1.0%), and SVE Triplets were rare (<1.0%). Isolated VEs were rare (<1.0%), VE Couplets were rare (<1.0%), and no VE Triplets were present.   Echo 04/2020 1. Left ventricular ejection fraction, by estimation, is 35 to 40%. The  left ventricle has moderately decreased function. The left ventricle  demonstrates global hypokinesis. Left ventricular diastolic parameters are  consistent with Grade I diastolic  dysfunction (impaired relaxation).   2. Right ventricular systolic function is normal. The right ventricular  size is normal. There is normal pulmonary artery systolic pressure. The  estimated right ventricular systolic pressure is 29.6 mmHg.   3. The mitral valve is normal in structure. Mild mitral valve  regurgitation.    cMRI 03/2020 IMPRESSION: 1.  Normal Left ventricular and right ventricular function, LVEF 57%   2.  There is no evidence of late gadolinium enhancement or scar   3.  No significant valvular abnormalities   4.  No evidence for infiltrative disease   5.  LVEF is now normalized compared to previous echocardiogram   [facility] 01/2020  The left ventricular ejection  fraction is 35-45% by visual estimate. There is mild to moderate left ventricular systolic dysfunction. LV end diastolic pressure is normal.   1.  Normal coronary arteries. 2.  Mildly to moderately reduced LV systolic function with an EF of 40 to 45%. 3.  Normal left ventricular end-diastolic pressure.   Recommendations: The patient has nonischemic cardiomyopathy for which I recommend continuing medical therapy.   Echo 01/2020 . Left ventricular ejection fraction, by estimation, is 35 to 40%. The  left ventricle has moderately decreased function. The left ventricle  demonstrates global hypokinesis. There is moderate left ventricular  hypertrophy. Left ventricular diastolic  parameters are consistent with Grade I diastolic dysfunction (impaired  relaxation). The average left ventricular global longitudinal strain is  -11.3 %.   2. Right ventricular systolic function is normal. The right ventricular  size is normal. Tricuspid regurgitation signal is inadequate for assessing  PA pressure.   3. Mild mitral valve regurgitation.      Physical Exam VS:  BP 120/70 (BP Location: Left Arm, Patient Position: Sitting, Cuff Size: Normal)   Pulse 75   Ht 5' 1 (1.549 m)   Wt 160 lb (72.6 kg)   LMP 06/06/2021   SpO2 96%   BMI 30.23 kg/m        Wt Readings from Last 3 Encounters:  05/28/24 160 lb (72.6  kg)  03/27/24 160 lb 4 oz (72.7 kg)  11/25/23 156 lb 12.8 oz (71.1 kg)    GEN: Well nourished, well developed in no acute distress NECK: No JVD; No carotid bruits CARDIAC: RRR, no murmurs, rubs, gallops RESPIRATORY:  Clear to auscultation without rales, wheezing or rhonchi  ABDOMEN: Soft, non-tender, non-distended EXTREMITIES:  No edema; No deformity   ASSESSMENT AND PLAN  Palpitations The patient denies further fluttering. Heart monitor was unremarkable. Continue Toprol  25mg  daily.   NICM HFmrEF Patient denies further lower leg edema. Continue Toprol  25mg  daily and Entresto   24-26mg BID.   HTN No further lightheadedness. Continue Toprol  and Entresto .        Dispo: Follow-up in 6 months  Signed, [place] [facility] VEAR Fishman, PA-C

## 2024-05-28 NOTE — Patient Instructions (Signed)
 Medication Instructions:  Continue current medications. *If you need a refill on your cardiac medications before your next appointment, please call your pharmacy*  Lab Work: No labs orders. If you have labs (blood work) drawn today and your tests are completely normal, you will receive your results only by: MyChart Message (if you have MyChart) OR A paper copy in the mail If you have any lab test that is abnormal or we need to change your treatment, we will call you to review the results.  Testing/Procedures: No tests order.  Follow-Up: At [facility], you and your health needs are our priority.  As part of our continuing mission to provide you with exceptional heart care, our providers are all part of one team.  This team includes your primary Cardiologist (physician) and Advanced Practice Providers or APPs (Physician Assistants and Nurse Practitioners) who all work together to provide you with the care you need, when you need it.  Your next appointment:   6 month(s)  Provider:   Redell Cave, MD or Cadence Franchester RIGGERS    We recommend signing up for the patient portal called MyChart.  Sign up information is provided on this After Visit Summary.  MyChart is used to connect with patients for Virtual Visits (Telemedicine).  Patients are able to view lab/test results, encounter notes, upcoming appointments, etc.  Non-urgent messages can be sent to your provider as well.   To learn more about what you can do with MyChart, go to [email or web address].

## 2024-05-28 NOTE — Telephone Encounter (Signed)
 Patient was seen in the office today by Cadence and expressed that since her Entresto  was switched to the generic form, it has cost more out of pocket. Will forward message to pharmacy for a trial run of the generic medication.

## 2024-05-29 ENCOUNTER — Telehealth: Payer: Self-pay | Admitting: Pharmacy Technician

## 2024-05-29 ENCOUNTER — Other Ambulatory Visit (HOSPITAL_COMMUNITY): Payer: Self-pay

## 2024-05-29 ENCOUNTER — Ambulatory Visit: Admitting: Rheumatology

## 2024-05-29 DIAGNOSIS — G894 Chronic pain syndrome: Secondary | ICD-10-CM

## 2024-05-29 DIAGNOSIS — R5383 Other fatigue: Secondary | ICD-10-CM

## 2024-05-29 DIAGNOSIS — F410 Panic disorder [episodic paroxysmal anxiety] without agoraphobia: Secondary | ICD-10-CM

## 2024-05-29 DIAGNOSIS — Z90710 Acquired absence of both cervix and uterus: Secondary | ICD-10-CM

## 2024-05-29 DIAGNOSIS — E782 Mixed hyperlipidemia: Secondary | ICD-10-CM

## 2024-05-29 DIAGNOSIS — M79672 Pain in left foot: Secondary | ICD-10-CM

## 2024-05-29 DIAGNOSIS — I5020 Unspecified systolic (congestive) heart failure: Secondary | ICD-10-CM

## 2024-05-29 DIAGNOSIS — G5620 Lesion of ulnar nerve, unspecified upper limb: Secondary | ICD-10-CM

## 2024-05-29 DIAGNOSIS — G5603 Carpal tunnel syndrome, bilateral upper limbs: Secondary | ICD-10-CM

## 2024-05-29 DIAGNOSIS — M47816 Spondylosis without myelopathy or radiculopathy, lumbar region: Secondary | ICD-10-CM

## 2024-05-29 DIAGNOSIS — I10 Essential (primary) hypertension: Secondary | ICD-10-CM

## 2024-05-29 DIAGNOSIS — I428 Other cardiomyopathies: Secondary | ICD-10-CM

## 2024-05-29 DIAGNOSIS — M2550 Pain in unspecified joint: Secondary | ICD-10-CM

## 2024-05-29 DIAGNOSIS — M791 Myalgia, unspecified site: Secondary | ICD-10-CM

## 2024-05-29 DIAGNOSIS — Z8659 Personal history of other mental and behavioral disorders: Secondary | ICD-10-CM

## 2024-05-29 DIAGNOSIS — G8929 Other chronic pain: Secondary | ICD-10-CM

## 2024-05-29 DIAGNOSIS — E041 Nontoxic single thyroid nodule: Secondary | ICD-10-CM

## 2024-05-29 DIAGNOSIS — M19041 Primary osteoarthritis, right hand: Secondary | ICD-10-CM

## 2024-05-29 NOTE — Telephone Encounter (Signed)
 Last filled generic on 04/02/24 90 days -too soon until 06/09/24 to see what the new copay would be. She has nurse, learning disability. I called and left her a message

## 2024-05-29 NOTE — Telephone Encounter (Signed)
° °  Last filled generic on 04/02/24 90 days -too soon until 06/09/24 to see what the new copay would be. She has nurse, learning disability. I called and left her a message

## 2024-06-11 ENCOUNTER — Other Ambulatory Visit: Payer: Self-pay

## 2024-06-11 ENCOUNTER — Other Ambulatory Visit (HOSPITAL_COMMUNITY): Payer: Self-pay

## 2024-06-11 MED ORDER — SACUBITRIL-VALSARTAN 24-26 MG PO TABS: 1.0000 | ORAL_TABLET | Freq: Two times a day (BID) | ORAL | 3 refills | Status: DC

## 2024-06-11 MED ORDER — ENTRESTO 24-26 MG PO TABS: 1.0000 | ORAL_TABLET | Freq: Two times a day (BID) | ORAL | 3 refills | Status: AC

## 2024-06-11 NOTE — Telephone Encounter (Signed)
 Hi, brand entresto  is cheaper on the patient's insurance with this coupon. Can the prescription for entresto  please be sent to the pharmacy as daw brand? Thank you!      Sent coupon to [facility]

## 2024-06-17 NOTE — Progress Notes (Signed)
 "  Office Visit Note  Patient: Leslie Esparza             Date of Birth: 1971/04/16           MRN: [id number]             PCP: Montey Lot, PA-C Referring: Montey Lot, PA-C Visit Date: 06/26/2024 Occupation: Data Unavailable  Subjective:  Pain in joints  History of Present Illness: Leslie Esparza is a 54 y.o. female with osteoarthritis.  She returns today after her last visit in June 2024.  She had left SI joint injection at the last visit.  She continues to have pain and stiffness in her both hands, both hips, her feet and lower back.  She states the injection gave her only minimal relief.  She continues to have some discomfort in the SI joint.    Activities of Daily Living:  Patient reports morning stiffness for  all day .   Patient Denies nocturnal pain.  Difficulty dressing/grooming: Denies Difficulty climbing stairs: Denies Difficulty getting out of chair: Denies Difficulty using hands for taps, buttons, cutlery, and/or writing: Denies  Review of Systems  Constitutional:  Negative for fatigue.  HENT:  Positive for mouth sores. Negative for mouth dryness.   Eyes:  Negative for dryness.  Respiratory:  Negative for shortness of breath.   Cardiovascular:  Negative for chest pain and palpitations.  Gastrointestinal:  Negative for blood in stool, constipation and diarrhea.  Endocrine: Negative for increased urination.  Genitourinary:  Negative for involuntary urination.  Musculoskeletal:  Positive for joint pain, joint pain and morning stiffness. Negative for gait problem, joint swelling, myalgias, muscle weakness, muscle tenderness and myalgias.  Skin:  Negative for color change, rash, hair loss and sensitivity to sunlight.  Allergic/Immunologic: Negative for susceptible to infections.  Neurological:  Negative for dizziness and headaches.  Hematological:  Negative for swollen glands.  Psychiatric/Behavioral:  Negative for depressed mood and sleep disturbance. The  patient is not nervous/anxious.     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   Chronic pain syndrome 08/11/2020   Lumbar radiculopathy 08/11/2020   Essential hypertension 04/23/2020   Depression 04/23/2020   Hx of migraine headaches 04/23/2020   Congestive heart failure (HCC) 03/22/2020   HFrEF (heart failure with reduced ejection fraction) (HCC)    Sacroiliac joint pain 03/28/2019   Pain in pelvis 01/11/2019   CAP (community acquired pneumonia) 04/25/2014   Cough 04/24/2014    Past Medical History:  Diagnosis Date   Allergic rhinitis    Bilateral carpal tunnel syndrome    CHF (congestive heart failure) (HCC)    ef 45 to 50 % per last echo 03-03-2021   Common migraine    Complication of anesthesia    itching after c section   History of COVID-19 11/04/2020   x 3 days all symptoms resolved   Hypertension    Multiple thyroid  nodules    monitored by pcp nathan conroy pa [facility], thryoid cysts also  Nonischemic cardiomyopathy (HCC)    OCD (obsessive compulsive disorder)    Osteoarthritis of thumbs    Pelvic pain    Recurrent cold sores     Family History  Problem Relation Age of Onset   Migraines Mother    Thyroid  disease Father    Heart disease Father    Fibromyalgia Father    Migraines Brother    Heart disease Paternal Grandfather    Healthy Son    Healthy Daughter    Stroke Neg Hx    Transient ischemic attack Neg Hx    Past Surgical  History:  Procedure Laterality Date   CESAREAN SECTION     x2 2000 and 2004   CYSTOSCOPY N/A 06/12/2021   Procedure: CYSTOSCOPY;  Surgeon: Gloriann Chick, MD;  Location: [facility];  Service: Gynecology;  Laterality: N/A;   LAPAROSCOPIC HYSTERECTOMY N/A 06/12/2021   Procedure: HYSTERECTOMY TOTAL LAPAROSCOPIC;  Surgeon: Gloriann Chick, MD;  Location: [facility];  Service: Gynecology;  Laterality: N/A;   LEFT HEART CATH AND CORONARY ANGIOGRAPHY Left 02/11/2020   Procedure: LEFT HEART CATH AND CORONARY ANGIOGRAPHY;  Surgeon: Darron Deatrice LABOR, MD;  Location: [facility];  Service: Cardiovascular;  Laterality: Left;   WISDOM TOOTH EXTRACTION     yrs ago per pt on 06-03-2021   WRIST ARTHROSCOPY WITH CARPOMETACARPEL [facility]) ARTHROPLASTY Bilateral    2024, 2023   Social History[1] Social History   Social History Narrative   Lives at home with husband and children   Right handed   Caffeine: none currently     Immunization History  Administered Date(s) Administered   Influenza Split 03/14/2014     Objective: Vital Signs: BP 111/72   Pulse 71   Temp 97.6 F (36.4 C)   Resp 16   Ht 5' 1 (1.549 m)   Wt 160 lb 12.8 oz (72.9 kg)   LMP 06/06/2021   BMI 30.38 kg/m    Physical Exam Vitals and nursing note reviewed.  Constitutional:      Appearance: She is well-developed.  HENT:     Head: Normocephalic and atraumatic.  Eyes:     Conjunctiva/sclera: Conjunctivae normal.  Cardiovascular:     Rate and Rhythm: Normal rate and regular rhythm.     Heart sounds: Normal heart sounds.  Pulmonary:     Effort: Pulmonary effort is normal.     Breath sounds: Normal breath sounds.  Abdominal:     General: Bowel sounds are normal.     Palpations: Abdomen is soft.  Musculoskeletal:     Cervical back: Normal range of motion.  Lymphadenopathy:     Cervical: No cervical adenopathy.  Skin:    General: Skin is warm and dry.     Capillary Refill: Capillary  refill takes less than 2 seconds.  Neurological:     Mental Status: She is alert and oriented to person, place, and time.  Psychiatric:        Behavior: Behavior normal.      Musculoskeletal Exam: Cervical, thoracic and lumbar spine were in good range of motion.  She had discomfort range of motion of her lumbar spine.  There was no SI joint tenderness.  Shoulder joints, elbow joints, wrist joints, MCPs, PIPs and DIPs were in good range of motion with no synovitis.  Hip joints and knee joints were in good range of motion without any warmth swelling or effusion.  There was no tenderness over ankles or MTPs.  She had tenderness over  right plantar fascia.  CDAI Exam: CDAI Score: -- Patient Global: --; Provider Global: -- Swollen: --; Tender: -- Joint Exam 06/26/2024   No joint exam has been documented for this visit   There is currently no information documented on the homunculus. Go to the Rheumatology activity and complete the homunculus joint exam.  Investigation: No additional findings.  Imaging: No results found.  Recent Labs: Lab Results  Component Value Date   WBC 7.3 03/27/2024   HGB 12.9 03/27/2024   PLT 405 03/27/2024   NA 140 03/27/2024   K 4.1 03/27/2024   CL 104 03/27/2024   CO2 22 03/27/2024   GLUCOSE 87 03/27/2024   BUN 10 03/27/2024   CREATININE 0.55 (L) 03/27/2024   BILITOT 0.3 03/27/2024   ALKPHOS 74 03/27/2024   AST 17 03/27/2024   ALT 20 03/27/2024   PROT 6.6 03/27/2024   ALBUMIN 4.3 03/27/2024   CALCIUM  9.6 03/27/2024   GFRAA 117 03/06/2020    Speciality Comments: No specialty comments available.  Procedures:  No procedures performed Allergies: Sulfamethoxazole-trimethoprim, Oxycodone , and Penicillins   Assessment / Plan:     Visit Diagnoses: Polyarthralgia-she continues to have pain and discomfort in multiple joints.  No synovitis was noted on the examination.  Primary osteoarthritis of both hands-she has osteoarthritic changes in her hands  none.  She had bilateral [facility] surgery in the past.  Ulnar nerve entrapment at elbow, bilateral-resolved.  Bilateral carpal tunnel syndrome-Left carpal tunnel release and left [facility] surgery July 2023, right carpal tunnel release and right [facility] surgery August 2024 by Dr. Alyse at [facility]   Chronic pain of both hips -she good range of motion of bilateral hip joints.  Previous x-rays were unremarkable.  Pain in both feet-she complains of discomfort in her right foot.  Plantar fasciitis of right foot-she tenderness over right plantar fascia.  A handout on plantar fasciitis and stretching exercises were given.  Chronic left SI joint pain - Left SI joint injection given on November 25, 2023.  Patient reports not much relief from the SI joint injections.  She continues to have SI joint discomfort and lower back discomfort.  SI joint stretches were demonstrated in the office.  Lumbar spondylosis - Mild spondylosis and facet joint arthropathy noted on previous x-rays.  I offered physical therapy but she declined.  She states she tried physical therapy last year which did not help.  Lumbar spine exercises were demonstrated in the office and handout was given.  Myalgia-no muscular weakness or tenderness was noted.  Other medical problems are listed as follows:  Other fatigue  Chronic pain syndrome  NICM (nonischemic cardiomyopathy) (HCC)  HFrEF (heart failure with reduced ejection fraction) (HCC)  Essential hypertension-blood pressure was normal today.  Mixed hyperlipidemia  History of OCD (obsessive compulsive disorder)  Thyroid  nodule  Panic disorder  S/P total hysterectomy  Orders: No orders of the defined types were placed in this encounter.  No orders of the defined types were placed in this encounter.   Follow-Up Instructions: Return in about 6 months (around 12/24/2024) for Osteoarthritis.   Maya Nash, MD  Note - This record has been created using Animal nutritionist.   Chart creation errors have been sought, but may not always  have been located. Such creation errors do not reflect on  the standard of medical care.     [1]  Social History Tobacco Use   Smoking status: Never    Passive exposure: Never   Smokeless tobacco: Never  Vaping Use  Vaping status: Never Used  Substance Use Topics   Alcohol use: No    Alcohol/week: 0.0 standard drinks of alcohol   Drug use: No   "

## 2024-06-26 ENCOUNTER — Encounter: Payer: Self-pay | Admitting: Rheumatology

## 2024-06-26 ENCOUNTER — Ambulatory Visit: Attending: Rheumatology | Admitting: Rheumatology

## 2024-06-26 VITALS — BP 111/72 | HR 71 | Temp 97.6°F | Resp 16 | Ht 61.0 in | Wt 160.8 lb

## 2024-06-26 DIAGNOSIS — F410 Panic disorder [episodic paroxysmal anxiety] without agoraphobia: Secondary | ICD-10-CM

## 2024-06-26 DIAGNOSIS — I5020 Unspecified systolic (congestive) heart failure: Secondary | ICD-10-CM

## 2024-06-26 DIAGNOSIS — M2550 Pain in unspecified joint: Secondary | ICD-10-CM

## 2024-06-26 DIAGNOSIS — G8929 Other chronic pain: Secondary | ICD-10-CM

## 2024-06-26 DIAGNOSIS — M79671 Pain in right foot: Secondary | ICD-10-CM | POA: Diagnosis not present

## 2024-06-26 DIAGNOSIS — E041 Nontoxic single thyroid nodule: Secondary | ICD-10-CM

## 2024-06-26 DIAGNOSIS — R5383 Other fatigue: Secondary | ICD-10-CM | POA: Diagnosis not present

## 2024-06-26 DIAGNOSIS — M19041 Primary osteoarthritis, right hand: Secondary | ICD-10-CM

## 2024-06-26 DIAGNOSIS — M19042 Primary osteoarthritis, left hand: Secondary | ICD-10-CM

## 2024-06-26 DIAGNOSIS — G894 Chronic pain syndrome: Secondary | ICD-10-CM | POA: Diagnosis not present

## 2024-06-26 DIAGNOSIS — M25552 Pain in left hip: Secondary | ICD-10-CM

## 2024-06-26 DIAGNOSIS — M533 Sacrococcygeal disorders, not elsewhere classified: Secondary | ICD-10-CM

## 2024-06-26 DIAGNOSIS — I428 Other cardiomyopathies: Secondary | ICD-10-CM

## 2024-06-26 DIAGNOSIS — E782 Mixed hyperlipidemia: Secondary | ICD-10-CM

## 2024-06-26 DIAGNOSIS — G5603 Carpal tunnel syndrome, bilateral upper limbs: Secondary | ICD-10-CM

## 2024-06-26 DIAGNOSIS — G5620 Lesion of ulnar nerve, unspecified upper limb: Secondary | ICD-10-CM

## 2024-06-26 DIAGNOSIS — Z8659 Personal history of other mental and behavioral disorders: Secondary | ICD-10-CM

## 2024-06-26 DIAGNOSIS — M722 Plantar fascial fibromatosis: Secondary | ICD-10-CM

## 2024-06-26 DIAGNOSIS — M79672 Pain in left foot: Secondary | ICD-10-CM

## 2024-06-26 DIAGNOSIS — Z90710 Acquired absence of both cervix and uterus: Secondary | ICD-10-CM

## 2024-06-26 DIAGNOSIS — I10 Essential (primary) hypertension: Secondary | ICD-10-CM

## 2024-06-26 DIAGNOSIS — M25551 Pain in right hip: Secondary | ICD-10-CM

## 2024-06-26 DIAGNOSIS — M791 Myalgia, unspecified site: Secondary | ICD-10-CM

## 2024-06-26 DIAGNOSIS — M47816 Spondylosis without myelopathy or radiculopathy, lumbar region: Secondary | ICD-10-CM

## 2024-06-26 NOTE — Patient Instructions (Addendum)
 Thoracic Strain Rehab Ask your health care provider which exercises are safe for you. Do exercises exactly as told by your provider and adjust them as directed. It is normal to feel mild stretching, pulling, tightness, or discomfort as you do these exercises. Stop right away if you feel sudden pain or your pain gets worse. Do not begin these exercises until told by your provider. Stretching and range-of-motion exercise This exercise warms up your muscles and joints and improves the movement and flexibility of your back and shoulders. This exercise also helps to relieve pain. Chest and spine stretch  Lie down on your back on a firm surface. Roll a towel or a small blanket so it is about 4 inches (10 cm) in diameter. Put the towel under the middle of your back so it is under your spine, but not under your shoulder blades. Put your hands behind your head and let your elbows fall to your sides. This will increase your stretch. Take a deep breath (inhale). Hold for __________ seconds. Relax after you breathe out (exhale). Repeat __________ times. Complete this exercise __________ times a day. Strengthening exercises These exercises build strength and endurance in your back and your shoulder blade muscles. Endurance is the ability to use your muscles for a long time, even after they get tired. Alternating arm and leg raises  Get on your hands and knees on a firm surface. If you are on a hard floor, you may want to use padding, such as an exercise mat, to cushion your knees. Line up your arms and legs. Your hands should be directly below your shoulders, and your knees should be directly below your hips. Lift your left leg behind you. At the same time, raise your right arm and straighten it in front of you. Do not lift your leg higher than your hip. Do not lift your arm higher than your shoulder. Keep your abdominal and back muscles tight. Keep your hips facing the ground. Do not arch your  back. Carefully stay balanced. Do not hold your breath. Hold for __________ seconds. Slowly return to the starting position and repeat with your right leg and your left arm. Repeat __________ times. Complete this exercise __________ times a day. Straight arm rows This exercise is also called the shoulder extension exercise. Stand with your feet shoulder width apart. Secure an exercise band to a stable object in front of you so the band is at or above shoulder height. Hold one end of the exercise band in each hand. Straighten your elbows and lift your hands up to shoulder height. Step back, away from the secured end of the exercise band, until the band stretches. Squeeze your shoulder blades together and pull your hands down to the sides of your thighs. Stop when your hands are straight down by your sides. This is shoulder extension. Do not let your hands go behind your body. Hold for __________ seconds. Slowly return to the starting position. Repeat __________ times. Complete this exercise __________ times a day. Rowing scapular retraction This is an exercise in which the shoulder blades (scapulae) are pulled toward each other (retraction). Sit in a stable chair without armrests, or stand up. Secure an exercise band to a stable object in front of you so the band is at shoulder height. Hold one end of the exercise band in each hand. Your palms should face toward each other. Bring your arms out straight in front of you. Step back, away from the secured end of the  exercise band, until the band stretches. Pull the band backward. As you do this, bend your elbows and squeeze your shoulder blades together, but avoid letting the rest of your body move. Do not shrug your shoulders upward while you do this. Stop when your elbows are at your sides or slightly behind your body. Hold for __________ seconds. Slowly straighten your arms to return to the starting position. Repeat __________ times.  Complete this exercise __________ times a day. Posture and body mechanics Good posture and healthy body mechanics can help to relieve stress in your body's tissues and joints. Body mechanics refers to the movements and positions of your body while you do your daily activities. Posture is part of body mechanics. Good posture means: Your spine is in its natural S-curve position (neutral). Your shoulders are pulled back slightly. Your head is not tipped forward. Follow these guidelines to improve your posture and body mechanics in your everyday activities. Standing  When standing, keep your spine neutral and your feet about hip width apart. Keep a slight bend in your knees. Your ears, shoulders, and hips should line up with each other. When you do a task in which you lean forward while standing in one place for a long time, place one foot up on a stable object that is 2-4 inches (5-10 cm) high, such as a footstool. This helps keep your spine neutral. Sitting  When sitting, keep your spine neutral and keep your feet flat on the floor. Use a footrest if needed. Keep your thighs parallel to the floor. Avoid rounding your shoulders, and avoid tilting your head forward. When working at a desk or a computer, keep your desk at a height where your hands are slightly lower than your elbows. Slide your chair under your desk so you are close enough to maintain good posture. When working at a computer, place your monitor at a height where you are looking straight ahead and you do not have to tilt your head forward or downward to look at the screen. Resting When lying down and resting, avoid positions that are most painful for you. If you have pain with activities such as sitting, bending, stooping, or squatting (flexion-basedactivities), lie in a position in which your body does not bend very much. For example, avoid curling up on your side with your arms and knees near your chest (fetal position). If you have  pain with activities such as standing for a long time or reaching with your arms (extension-basedactivities), lie with your spine in a neutral position and bend your knees slightly. Try the following positions: Lie on your side with a pillow between your knees. Lie on your back with a pillow under your knees.  Lifting  When lifting objects, keep your feet at least shoulder width apart and tighten your abdominal muscles. Bend your knees and hips and keep your spine neutral. It is important to lift using the strength of your legs, not your back. Do not lock your knees straight out. Always ask for help to lift heavy or awkward objects. This information is not intended to replace advice given to you by your health care provider. Make sure you discuss any questions you have with your health care provider. Document Revised: 11/12/2022 Document Reviewed: 01/18/2022 [employer] Patient Education  2024 [employer]Low Back Sprain or Strain Rehab Ask your health care provider which exercises are safe for you. Do exercises exactly as told by your health care provider and adjust them as directed. It is  normal to feel mild stretching, pulling, tightness, or discomfort as you do these exercises. Stop right away if you feel sudden pain or your pain gets worse. Do not begin these exercises until told by your health care provider. Stretching and range-of-motion exercises These exercises warm up your muscles and joints and improve the movement and flexibility of your back. These exercises also help to relieve pain, numbness, and tingling. Lumbar rotation  Lie on your back on a firm bed or the floor with your knees bent. Straighten your arms out to your sides so each arm forms a 90-degree angle (right angle) with a side of your body. Slowly move (rotate) both of your knees to one side of your body until you feel a stretch in your lower back (lumbar). Try not to let your shoulders lift off the floor. Hold this  position for __________ seconds. Tense your abdominal muscles and slowly move your knees back to the starting position. Repeat this exercise on the other side of your body. Repeat __________ times. Complete this exercise __________ times a day. Single knee to chest  Lie on your back on a firm bed or the floor with both legs straight. Bend one of your knees. Use your hands to move your knee up toward your chest until you feel a gentle stretch in your lower back and buttock. Hold your leg in this position by holding on to the front of your knee. Keep your other leg as straight as possible. Hold this position for __________ seconds. Slowly return to the starting position. Repeat with your other leg. Repeat __________ times. Complete this exercise __________ times a day. Prone extension on elbows  Lie on your abdomen on a firm bed or the floor (prone position). Prop yourself up on your elbows. Use your arms to help lift your chest up until you feel a gentle stretch in your abdomen and your lower back. This will place some of your body weight on your elbows. If this is uncomfortable, try stacking pillows under your chest. Your hips should stay down, against the surface that you are lying on. Keep your hip and back muscles relaxed. Hold this position for __________ seconds. Slowly relax your upper body and return to the starting position. Repeat __________ times. Complete this exercise __________ times a day. Strengthening exercises These exercises build strength and endurance in your back. Endurance is the ability to use your muscles for a long time, even after they get tired. Pelvic tilt This exercise strengthens the muscles that lie deep in the abdomen. Lie on your back on a firm bed or the floor with your legs extended. Bend your knees so they are pointing toward the ceiling and your feet are flat on the floor. Tighten your lower abdominal muscles to press your lower back against the  floor. This motion will tilt your pelvis so your tailbone points up toward the ceiling instead of pointing to your feet or the floor. To help with this exercise, you may place a small towel under your lower back and try to push your back into the towel. Hold this position for __________ seconds. Let your muscles relax completely before you repeat this exercise. Repeat __________ times. Complete this exercise __________ times a day. Alternating arm and leg raises  Get on your hands and knees on a firm surface. If you are on a hard floor, you may want to use padding, such as an exercise mat, to cushion your knees. Line up your arms and  legs. Your hands should be directly below your shoulders, and your knees should be directly below your hips. Lift your left leg behind you. At the same time, raise your right arm and straighten it in front of you. Do not lift your leg higher than your hip. Do not lift your arm higher than your shoulder. Keep your abdominal and back muscles tight. Keep your hips facing the ground. Do not arch your back. Keep your balance carefully, and do not hold your breath. Hold this position for __________ seconds. Slowly return to the starting position. Repeat with your right leg and your left arm. Repeat __________ times. Complete this exercise __________ times a day. Abdominal set with straight leg raise  Lie on your back on a firm bed or the floor. Bend one of your knees and keep your other leg straight. Tense your abdominal muscles and lift your straight leg up, 4-6 inches (10-15 cm) off the ground. Keep your abdominal muscles tight and hold this position for __________ seconds. Do not hold your breath. Do not arch your back. Keep it flat against the ground. Keep your abdominal muscles tense as you slowly lower your leg back to the starting position. Repeat with your other leg. Repeat __________ times. Complete this exercise __________ times a day. Single leg lower  with bent knees Lie on your back on a firm bed or the floor. Tense your abdominal muscles and lift your feet off the floor, one foot at a time, so your knees and hips are bent in 90-degree angles (right angles). Your knees should be over your hips and your lower legs should be parallel to the floor. Keeping your abdominal muscles tense and your knee bent, slowly lower one of your legs so your toe touches the ground. Lift your leg back up to return to the starting position. Do not hold your breath. Do not let your back arch. Keep your back flat against the ground. Repeat with your other leg. Repeat __________ times. Complete this exercise __________ times a day. Posture and body mechanics Good posture and healthy body mechanics can help to relieve stress in your body's tissues and joints. Body mechanics refers to the movements and positions of your body while you do your daily activities. Posture is part of body mechanics. Good posture means: Your spine is in its natural S-curve position (neutral). Your shoulders are pulled back slightly. Your head is not tipped forward (neutral). Follow these guidelines to improve your posture and body mechanics in your everyday activities. Standing  When standing, keep your spine neutral and your feet about hip-width apart. Keep a slight bend in your knees. Your ears, shoulders, and hips should line up. When you do a task in which you stand in one place for a long time, place one foot up on a stable object that is 2-4 inches (5-10 cm) high, such as a footstool. This helps keep your spine neutral. Sitting  When sitting, keep your spine neutral and keep your feet flat on the floor. Use a footrest, if necessary, and keep your thighs parallel to the floor. Avoid rounding your shoulders, and avoid tilting your head forward. When working at a desk or a computer, keep your desk at a height where your hands are slightly lower than your elbows. Slide your chair under  your desk so you are close enough to maintain good posture. When working at a computer, place your monitor at a height where you are looking straight ahead and you do  not have to tilt your head forward or downward to look at the screen. Resting When lying down and resting, avoid positions that are most painful for you. If you have pain with activities such as sitting, bending, stooping, or squatting, lie in a position in which your body does not bend very much. For example, avoid curling up on your side with your arms and knees near your chest (fetal position). If you have pain with activities such as standing for a long time or reaching with your arms, lie with your spine in a neutral position and bend your knees slightly. Try the following positions: Lying on your side with a pillow between your knees. Lying on your back with a pillow under your knees. Lifting  When lifting objects, keep your feet at least shoulder-width apart and tighten your abdominal muscles. Bend your knees and hips and keep your spine neutral. It is important to lift using the strength of your legs, not your back. Do not lock your knees straight out. Always ask for help to lift heavy or awkward objects. This information is not intended to replace advice given to you by your health care provider. Make sure you discuss any questions you have with your health care provider. Document Revised: 10/04/2022 Document Reviewed: 08/18/2020 [employer] Patient Education  2024 [employer]  Exercises for Plantar Fasciitis Foot and leg exercises can help if you have plantar fasciitis. Only do the exercises you were told to do. Make sure you know how to do the exercises safely. Follow the steps below. It's normal to feel mild discomfort. Stop if you feel pain or your pain gets worse. Do not start these exercises until told by your health care provider. Stretching and range-of-motion exercises These exercises warm up your muscles and  joints. They also help with movement and flexibility of your foot. They can help with pain. Plantar fascia stretch This exercise will stretch your plantar fascia, which is a band of thick tissue on the bottom of your foot. Sit with your left / right leg crossed over your other knee. Hold your heel with one hand with that thumb near your arch. With your other hand, hold your toes. Gently pull your toes back toward the top of your foot. You should feel a stretch on the bottom of your toes, on the bottom of your foot, or both. Hold this stretch for __________ seconds. Slowly let go of your toes. Go back to the starting position. Repeat __________ times. Do this exercise __________ times a day. Gastroc stretch, standing This exercise is called an upper calf, or gastroc, stretch. It stretches the muscles in the back of your upper calf. Stand with your hands against a wall. Extend your left / right leg behind you. Bend your front knee just a little. Keep your heels on the floor, your toes facing forward, and your back knee straight. Shift your weight toward the wall. Do not arch your back. You should feel a gentle stretch in your upper calf. Hold this position for __________ seconds. Repeat __________ times. Do this exercise __________ times a day. Soleus stretch, standing This exercise is called a lower calf, or soleus, stretch. It stretches the muscles in the back of your lower calf. Stand with your hands against a wall. Extend your left / right leg behind you, and bend your front knee slightly. Keep your heels on the floor and your toes facing forward. Bend your back knee and shift your weight slightly over your back  leg. You should feel a gentle stretch deep in your lower calf. Hold this position for __________ seconds. Repeat __________ times. Do this exercise __________ times a day. Gastroc and soleus stretch, standing step This exercise stretches the muscles in the back of your lower leg.  This includes your gastroc and soleus muscles. Stand with the ball of your left / right foot on the front of a step. The ball of your foot is on the walking surface, right under your toes. Keep your other foot firmly on the same step. Hold on to the wall or a railing for balance. Slowly lift your other foot, letting your body weight press your heel down over the edge of the front of the step. Keep your knee straight and unbent. You should feel a stretch in your calf. Hold this position for __________ seconds. Return both feet to the step. Repeat this exercise with a slight bend in your left / right knee. Repeat __________ times with your left / right knee straight and __________ times with your left / right knee bent. Do this exercise __________ times a day. Balance exercise This exercise builds your balance and strength control of your arch. It helps take pressure off your plantar fascia. Single leg stand If this exercise is too easy, you can try it with your eyes closed or while standing on a pillow. Without shoes, stand near a railing or in a doorway. You may hold on to the railing or doorway as needed. Stand on your left / right foot. Keep your big toe down on the floor. Lift the arch of your foot. You should feel a stretch across the bottom of your foot and arch. Do not let your foot roll inward. Hold this position for __________ seconds. Repeat __________ times. Do this exercise __________ times a day. This information is not intended to replace advice given to you by your health care provider. Make sure you discuss any questions you have with your health care provider. Document Revised: 11/01/2022 Document Reviewed: 11/01/2022 [employer] Patient Education  2024 [employer].

## 2025-01-15 ENCOUNTER — Ambulatory Visit: Admitting: Rheumatology
